# Patient Record
Sex: Female | Born: 1945 | Race: White | Hispanic: No | Smoking: Former smoker
Health system: Southern US, Community
[De-identification: ages and names within clinical notes are randomized; demographics above are authoritative.]

## PROBLEM LIST (undated history)

## (undated) DIAGNOSIS — I2699 Other pulmonary embolism without acute cor pulmonale: Secondary | ICD-10-CM

## (undated) DIAGNOSIS — M8580 Other specified disorders of bone density and structure, unspecified site: Secondary | ICD-10-CM

## (undated) DIAGNOSIS — M1990 Unspecified osteoarthritis, unspecified site: Secondary | ICD-10-CM

## (undated) DIAGNOSIS — J449 Chronic obstructive pulmonary disease, unspecified: Secondary | ICD-10-CM

## (undated) DIAGNOSIS — I4891 Unspecified atrial fibrillation: Secondary | ICD-10-CM

## (undated) DIAGNOSIS — M810 Age-related osteoporosis without current pathological fracture: Secondary | ICD-10-CM

## (undated) DIAGNOSIS — R627 Adult failure to thrive: Secondary | ICD-10-CM

## (undated) DIAGNOSIS — E785 Hyperlipidemia, unspecified: Secondary | ICD-10-CM

## (undated) DIAGNOSIS — H269 Unspecified cataract: Secondary | ICD-10-CM

## (undated) DIAGNOSIS — K219 Gastro-esophageal reflux disease without esophagitis: Secondary | ICD-10-CM

## (undated) DIAGNOSIS — I7300 Raynaud's syndrome without gangrene: Secondary | ICD-10-CM

## (undated) HISTORY — DX: Raynaud's syndrome without gangrene: I73.00

## (undated) HISTORY — DX: Chronic obstructive pulmonary disease, unspecified: J44.9

## (undated) HISTORY — DX: Gastro-esophageal reflux disease without esophagitis: K21.9

## (undated) HISTORY — DX: Unspecified cataract: H26.9

## (undated) HISTORY — DX: Age-related osteoporosis without current pathological fracture: M81.0

## (undated) HISTORY — DX: Unspecified osteoarthritis, unspecified site: M19.90

## (undated) HISTORY — DX: Other pulmonary embolism without acute cor pulmonale: I26.99

## (undated) HISTORY — PX: TUBAL LIGATION: SHX77

## (undated) HISTORY — DX: Other specified disorders of bone density and structure, unspecified site: M85.80

## (undated) HISTORY — DX: Hyperlipidemia, unspecified: E78.5

## (undated) HISTORY — PX: FOOT NEUROMA SURGERY: SHX646

---

## 2000-11-03 ENCOUNTER — Emergency Department (HOSPITAL_COMMUNITY): Admission: EM | Admit: 2000-11-03 | Discharge: 2000-11-03 | Payer: Self-pay | Admitting: Emergency Medicine

## 2000-11-03 ENCOUNTER — Encounter: Payer: Self-pay | Admitting: Emergency Medicine

## 2002-03-29 ENCOUNTER — Other Ambulatory Visit: Admission: RE | Admit: 2002-03-29 | Discharge: 2002-03-29 | Payer: Self-pay | Admitting: Obstetrics and Gynecology

## 2004-12-31 ENCOUNTER — Other Ambulatory Visit: Admission: RE | Admit: 2004-12-31 | Discharge: 2004-12-31 | Payer: Self-pay | Admitting: Family Medicine

## 2006-06-03 ENCOUNTER — Other Ambulatory Visit: Admission: RE | Admit: 2006-06-03 | Discharge: 2006-06-03 | Payer: Self-pay | Admitting: Family Medicine

## 2011-11-08 ENCOUNTER — Encounter: Payer: Self-pay | Admitting: Internal Medicine

## 2011-11-09 ENCOUNTER — Encounter: Payer: Self-pay | Admitting: Internal Medicine

## 2011-11-09 ENCOUNTER — Ambulatory Visit (INDEPENDENT_AMBULATORY_CARE_PROVIDER_SITE_OTHER): Payer: PRIVATE HEALTH INSURANCE | Admitting: Internal Medicine

## 2011-11-09 VITALS — BP 110/72 | HR 76 | Temp 97.6°F | Ht 66.0 in | Wt 127.0 lb

## 2011-11-09 DIAGNOSIS — J449 Chronic obstructive pulmonary disease, unspecified: Secondary | ICD-10-CM

## 2011-11-09 NOTE — Patient Instructions (Signed)
symbicort 160 Take 2 puffs first thing in am and then another 2 puffs about 12 hours later if any breathing problems at all  Continue spiriva daily for now (think of it like high octane fuel)   Work on inhaler technique:  relax and gently blow all the way out then take a nice smooth deep breath back in, triggering the inhaler at same time you start breathing in.  Hold for up to 5 seconds if you can.  Rinse and gargle with water when done   If your mouth or throat starts to bother you,   I suggest you time the inhaler to your dental care and after using the inhaler(s) brush teeth and tongue with a baking soda containing toothpaste and when you rinse this out, gargle with it first to see if this helps your mouth and throat.    GERD (REFLUX)  is an extremely common cause of respiratory symptoms, many times with no significant heartburn at all.    It can be treated with medication, but also with lifestyle changes including avoidance of late meals, excessive alcohol, smoking cessation, and avoid fatty foods, chocolate, peppermint, colas, red wine, and acidic juices such as orange juice.  NO MINT OR MENTHOL PRODUCTS SO NO COUGH DROPS  USE SUGARLESS CANDY INSTEAD (jolley ranchers or Stover's)  NO OIL BASED VITAMINS - use powdered substitutes.    Please schedule a follow up visit in 3 months but call sooner if needed with CXR on return

## 2011-11-09 NOTE — Progress Notes (Signed)
  Subjective:    Patient ID: Shannon Jackson, female    DOB: 1946-10-02, 65 y.o.   MRN: [id number]  HPI  57 yowf quit smoking 2008 p dx of copd 2006 with freq exacerbations sev times a year but in between baseline =  doe vacuuming x 10 min so referred 11/09/2011 to pulmonary clinic in [place] by Dr Christell Constant.  11/09/2011 1st pulmonary eval cc indolent onset persistent  doe x vacuuming x 14m  And still dances but only  x 5 min and does aerobics x 15 min s stopping.  Maintained on spiriva once daily in am and prn symbicort and occ proventil.  Minimal cough more day than night.  Exac typically assoc with uri/ sinus flares which last a week or two and require ov's/ abx / minimal prednisone.  Sleeping ok without nocturnal  or early am exacerbation  of respiratory  c/o's or need for noct saba. Also denies any obvious fluctuation of symptoms with weather or environmental changes or other aggravating or alleviating factors except as outlined above.      Review of Systems  Constitutional: Negative for fever and unexpected weight change.  HENT: Negative for ear pain, nosebleeds, congestion, sore throat, rhinorrhea, sneezing, trouble swallowing, dental problem, postnasal drip and sinus pressure.   Eyes: Negative for redness and itching.  Respiratory: Positive for shortness of breath. Negative for cough, chest tightness and wheezing.   Cardiovascular: Negative for palpitations and leg swelling.  Gastrointestinal: Negative for nausea and vomiting.  Genitourinary: Negative for dysuria.  Musculoskeletal: Negative for joint swelling.  Skin: Negative for rash.  Neurological: Negative for headaches.  Hematological: Does not bruise/bleed easily.  Psychiatric/Behavioral: Positive for dysphoric mood. The patient is nervous/anxious.        Objective:   Physical Exam  11/09/2011  127  HEENT mild turbinate edema.  Oropharynx no thrush or excess pnd or cobblestoning.  No JVD or cervical adenopathy. Mild accessory  muscle hypertrophy. Trachea midline, nl thryroid. Chest was hyperinflated by percussion with diminished breath sounds and moderate increased exp time without wheeze. Hoover sign positive at mid inspiration. Regular rate and rhythm without murmur gallop or rub or increase P2 or edema.  Abd: no hsm, nl excursion. Ext warm without cyanosis or clubbing.    cxr 07/23/11 COPD with no acute dz    Assessment & Plan:

## 2011-11-10 DIAGNOSIS — J449 Chronic obstructive pulmonary disease, unspecified: Secondary | ICD-10-CM | POA: Insufficient documentation

## 2011-11-10 NOTE — Assessment & Plan Note (Addendum)
DDX of  difficult airways managment all start with A and  include Adherence, Ace Inhibitors, Acid Reflux, Active Sinus Disease, Alpha 1 Antitripsin deficiency, Anxiety masquerading as Airways dz,  ABPA,  allergy(esp in young), Aspiration (esp in elderly), Adverse effects of DPI,  Active smokers, plus two Bs  = Bronchiectasis and Beta blocker use..and one C= CHF  Adherence is always the initial "prime suspect" and is a multilayered concern that requires a "trust but verify" approach in every patient - starting with knowing how to use medications, especially inhalers, correctly, keeping up with refills and understanding the fundamental difference between maintenance and prns vs those medications only taken for a very short course and then stopped and not refilled. The proper method of use, as well as anticipated side effects, of this metered-dose inhaler are discussed and demonstrated to the patient. Improved to 75% p extensive teaching  She has 29% p bronchodilators and yet is casual with use of symbicort which I strongly reinforced.   ? Acid reflux > diet reviewed

## 2012-01-27 ENCOUNTER — Telehealth: Payer: Self-pay | Admitting: Internal Medicine

## 2012-01-27 NOTE — Telephone Encounter (Signed)
LMTCB x 1 

## 2012-01-28 NOTE — Telephone Encounter (Signed)
No but either need to bring the actual cxr in hand or the report to complete the w/u

## 2012-01-28 NOTE — Telephone Encounter (Signed)
Pt was seen by Dr. Sherene Sires on 11/09/11 and was instructed to: Please schedule a follow up visit in 3 months but call sooner if needed with CXR on return  Pt states she had a COPD Exacerbation and was seen by PCP for this on Monday and a CXR was done at [facility].  She would like to know if Dr. Sherene Sires would still want her to have a CXR prior to her next appt with him which is scheduled on 03/07/12 in [place].  Dr. Sherene Sires, pls advise.  Thanks!

## 2012-01-28 NOTE — Telephone Encounter (Signed)
Called, spoke with pt.  She is aware she does not need another CXR prior to next OV with MW but she will need to either bring the actual cxr with her or a copy of the report to complete the w/u.  She verbalized understanding of this and nothing further needed at this time.

## 2012-03-07 ENCOUNTER — Ambulatory Visit: Payer: Self-pay | Admitting: Internal Medicine

## 2012-04-20 ENCOUNTER — Other Ambulatory Visit: Payer: Self-pay | Admitting: Orthopedic Surgery

## 2012-04-20 DIAGNOSIS — M25559 Pain in unspecified hip: Secondary | ICD-10-CM

## 2012-05-02 ENCOUNTER — Other Ambulatory Visit: Payer: Self-pay

## 2012-05-22 ENCOUNTER — Other Ambulatory Visit: Payer: Self-pay

## 2012-05-27 ENCOUNTER — Ambulatory Visit
Admission: RE | Admit: 2012-05-27 | Discharge: 2012-05-27 | Disposition: A | Discharge status: Home / Self Care | Payer: Medicare Other | Source: Ambulatory Visit | Attending: Orthopedic Surgery | Admitting: Orthopedic Surgery

## 2012-05-27 DIAGNOSIS — M25559 Pain in unspecified hip: Secondary | ICD-10-CM

## 2013-03-05 ENCOUNTER — Other Ambulatory Visit: Payer: Self-pay | Admitting: *Deleted

## 2013-03-05 MED ORDER — LORAZEPAM 1 MG PO TABS: 1.0000 mg | ORAL_TABLET | Freq: Two times a day (BID) | ORAL | Status: DC | PRN

## 2013-03-05 NOTE — Telephone Encounter (Signed)
Pt aware rx for lorazepam ready for pick up here.

## 2013-05-30 ENCOUNTER — Other Ambulatory Visit: Payer: Self-pay | Admitting: *Deleted

## 2013-05-30 NOTE — Telephone Encounter (Signed)
LAST RF 04/18/13. LAST OV 1/14. PLEASE CALL IN [place] [phone]

## 2013-06-01 ENCOUNTER — Other Ambulatory Visit: Payer: Self-pay | Admitting: *Deleted

## 2013-06-01 MED ORDER — LORAZEPAM 1 MG PO TABS: 1.0000 mg | ORAL_TABLET | Freq: Two times a day (BID) | ORAL | Status: DC | PRN

## 2013-06-01 NOTE — Telephone Encounter (Signed)
rx called into pharmacy

## 2013-06-01 NOTE — Telephone Encounter (Signed)
Please call in rx for ativan with 1 refill 

## 2013-06-01 NOTE — Telephone Encounter (Signed)
Patient last seen in office on 12-29-12 by ACM. Was instructed to follow up in 6 months per note. Has appt with [facility] in July. Rx last filled on 04-18-13 for #60. Please advise. If approved please have nurse phone in to [facility] in [place] at [phone]

## 2013-06-28 ENCOUNTER — Ambulatory Visit (INDEPENDENT_AMBULATORY_CARE_PROVIDER_SITE_OTHER): Payer: Medicare Other | Admitting: Family Medicine

## 2013-06-28 ENCOUNTER — Encounter: Payer: Self-pay | Admitting: Family Medicine

## 2013-06-28 VITALS — BP 119/73 | HR 90 | Temp 97.6°F | Ht 66.0 in | Wt 124.6 lb

## 2013-06-28 DIAGNOSIS — J449 Chronic obstructive pulmonary disease, unspecified: Secondary | ICD-10-CM

## 2013-06-28 DIAGNOSIS — E7800 Pure hypercholesterolemia, unspecified: Secondary | ICD-10-CM

## 2013-06-28 DIAGNOSIS — I7300 Raynaud's syndrome without gangrene: Secondary | ICD-10-CM | POA: Insufficient documentation

## 2013-06-28 DIAGNOSIS — K219 Gastro-esophageal reflux disease without esophagitis: Secondary | ICD-10-CM

## 2013-06-28 LAB — POCT CBC
Granulocyte percent: 63.1 %G (ref 37–80)
HCT, POC: 45.2 % (ref 37.7–47.9)
Hemoglobin: 14.7 g/dL (ref 12.2–16.2)
Lymph, poc: 2.1 (ref 0.6–3.4)
MCH, POC: 30.4 pg (ref 27–31.2)
MCHC: 32.6 g/dL (ref 31.8–35.4)
MCV: 93.1 fL (ref 80–97)
MPV: 7.5 fL (ref 0–99.8)
POC Granulocyte: 4.4 (ref 2–6.9)
POC LYMPH PERCENT: 31 %L (ref 10–50)
Platelet Count, POC: 226 10*3/uL (ref 142–424)
RBC: 4.9 M/uL (ref 4.04–5.48)
RDW, POC: 13 %
WBC: 6.9 10*3/uL (ref 4.6–10.2)

## 2013-06-28 MED ORDER — PANTOPRAZOLE SODIUM 40 MG PO TBEC: 40.0000 mg | DELAYED_RELEASE_TABLET | Freq: Every day | ORAL | Status: DC

## 2013-06-28 MED ORDER — TIOTROPIUM BROMIDE MONOHYDRATE 18 MCG IN CAPS: 18.0000 ug | ORAL_CAPSULE | Freq: Every day | RESPIRATORY_TRACT | Status: DC

## 2013-06-28 MED ORDER — ROFLUMILAST 500 MCG PO TABS: 500.0000 ug | ORAL_TABLET | Freq: Every day | ORAL | Status: DC

## 2013-06-28 NOTE — Patient Instructions (Addendum)
      Dr Rakiyah Esch's Recommendations  Diet and Exercise discussed with patient.  For nutrition information, I recommend books:  1).Eat to Live by Dr Joel Fuhrman. 2).Prevent and Reverse Heart Disease by Dr Caldwell Esselstyn. 3) Dr Neal Barnard's Book:  Program to Reverse Diabetes  Exercise recommendations are:  If unable to walk, then the patient can exercise in a chair 3 times a day. By flapping arms like a bird gently and raising legs outwards to the front.  If ambulatory, the patient can go for walks for 30 minutes 3 times a week. Then increase the intensity and duration as tolerated.  Goal is to try to attain exercise frequency to 5 times a week.  If applicable: Best to perform resistance exercises (machines or weights) 2 days a week and cardio type exercises 3 days per week.  

## 2013-06-28 NOTE — Progress Notes (Signed)
Patient ID: Shannon Jackson, female   DOB: 28-Aug-1946, 67 y.o.   MRN: [id number] SUBJECTIVE: CC: Chief Complaint  Patient presents with  . Follow-up    6 month follow up refills needed . congestion hx copd    HPI: Here for follow up of COPD. Intermittent exacerbation of COPD due to mowing grass and certain days with high pollen counts.today a little congested.No chest pain.  Past Medical History  Diagnosis Date  . Raynaud's syndrome   . Arthritis     left hip  . Allergic rhinitis   . Osteopenia   . COPD (chronic obstructive pulmonary disease)    Past Surgical History  Procedure Laterality Date  . Foot neuroma surgery    . Tubal ligation     History   Social History  . Marital Status: Single    Spouse Name: N/A    Number of Children: N/A  . Years of Education: N/A   Occupational History  . retired    Social History Main Topics  . Smoking status: Former Smoker -- 1.00 packs/day for 25 years    Types: Cigarettes    Quit date: 12/06/2006  . Smokeless tobacco: Never Used  . Alcohol Use: No  . Drug Use: No  . Sexually Active: Not on file   Other Topics Concern  . Not on file   Social History Narrative  . No narrative on file   Family History  Problem Relation Age of Onset  . Melanoma Mother   . Emphysema Father   . Rheum arthritis Father    Current Outpatient Prescriptions on File Prior to Visit  Medication Sig Dispense Refill  . albuterol (PROVENTIL HFA;VENTOLIN HFA) 108 (90 BASE) MCG/ACT inhaler Inhale 2 puffs into the lungs every 6 (six) hours as needed.        . budesonide-formoterol (SYMBICORT) 160-4.5 MCG/ACT inhaler Inhale 2 puffs into the lungs 2 (two) times daily.        Marland Kitchen LORazepam (ATIVAN) 1 MG tablet Take 1 tablet (1 mg total) by mouth 2 (two) times daily as needed.  60 tablet  1  . Multiple Vitamin (MULTIVITAMIN) tablet Take 1 tablet by mouth daily.        . calcium-vitamin D (OSCAL WITH D) 250-125 MG-UNIT per tablet Take 1 tablet by mouth daily.          No current facility-administered medications on file prior to visit.   Allergies  Allergen Reactions  . Codeine   . Levofloxacin    Immunization History  Administered Date(s) Administered  . DTaP 05/06/2006  . Influenza Split 10/06/2001, 11/05/2006, 09/05/2010  . Influenza Whole 09/06/2011  . Pneumococcal-Generic 09/05/2010   Prior to Admission medications   Medication Sig Start Date End Date Taking? Authorizing Provider  albuterol (ACCUNEB) 1.25 MG/3ML nebulizer solution Take 1 ampule by nebulization 3 (three) times daily as needed for wheezing.   Yes Historical Provider, MD  albuterol (PROVENTIL HFA;VENTOLIN HFA) 108 (90 BASE) MCG/ACT inhaler Inhale 2 puffs into the lungs every 6 (six) hours as needed.     Yes Historical Provider, MD  budesonide-formoterol (SYMBICORT) 160-4.5 MCG/ACT inhaler Inhale 2 puffs into the lungs 2 (two) times daily.     Yes Historical Provider, MD  LORazepam (ATIVAN) 1 MG tablet Take 1 tablet (1 mg total) by mouth 2 (two) times daily as needed. 06/01/13  Yes Westlynn-Margaret Daphine Deutscher, FNP  Multiple Vitamin (MULTIVITAMIN) tablet Take 1 tablet by mouth daily.     Yes Historical Provider, MD  pantoprazole (PROTONIX)  40 MG tablet Take 1 tablet (40 mg total) by mouth daily. 06/28/13  Yes Ileana Ladd, MD  predniSONE (DELTASONE) 10 MG tablet  05/05/13  Yes Historical Provider, MD  Pseudoephedrine-Guaifenesin (MUCINEX D) [phone] MG TB12 Take 1 tablet by mouth.   Yes Historical Provider, MD  tiotropium (SPIRIVA) 18 MCG inhalation capsule Place 1 capsule (18 mcg total) into inhaler and inhale daily. 06/28/13  Yes Ileana Ladd, MD  calcium-vitamin D (OSCAL WITH D) 250-125 MG-UNIT per tablet Take 1 tablet by mouth daily.      Historical Provider, MD  roflumilast (DALIRESP) 500 MCG TABS tablet Take 1 tablet (500 mcg total) by mouth daily. 06/28/13   Ileana Ladd, MD     ROS: As above in the HPI. All other systems are stable or negative.  OBJECTIVE: APPEARANCE:   Patient in no acute distress.The patient appeared well nourished and normally developed. Acyanotic. Waist: VITAL SIGNS:BP 119/73  Pulse 90  Temp(Src) 97.6 F (36.4 C) (Oral)  Ht 5\' 6"  (1.676 m)  Wt 124 lb 9.6 oz (56.518 kg)  BMI 20.12 kg/m2 WF  SKIN: warm and  Dry without overt rashes, tattoos and scars  HEAD and Neck: without JVD, Head and scalp: normal Eyes:No scleral icterus. Fundi normal, eye movements normal. Ears: Auricle normal, canal normal, Tympanic membranes normal, insufflation normal. Nose: mild Throat: normal Neck & thyroid: normal  CHEST & LUNGS: Chest wall: normal Lungs: Coarse breath sounds with an occasional expiratory wheeze.  CVS: Reveals the PMI to be normally located. Regular rhythm, First and Second Heart sounds are normal,  absence of murmurs, rubs or gallops. Peripheral vasculature: Radial pulses: normal Dorsal pedis pulses: normal Posterior pulses: normal  ABDOMEN:  Appearance: normal Benign, no organomegaly, no masses, no Abdominal Aortic enlargement. No Guarding , no rebound. No Bruits. Bowel sounds: normal  RECTAL: N/A GU: N/A  EXTREMETIES: nonedematous. Both Femoral and Pedal pulses are normal.  MUSCULOSKELETAL:  Spine: normal Joints: intact  NEUROLOGIC: oriented to time,place and person; nonfocal. Strength is normal Sensory is normal Reflexes are normal Cranial Nerves are normal.    ASSESSMENT: Raynaud's disease - Plan: COMPLETE METABOLIC PANEL WITH GFR, Vitamin D 25 hydroxy  COPD (chronic obstructive pulmonary disease) - Plan: tiotropium (SPIRIVA) 18 MCG inhalation capsule, POCT CBC, CANCELED: Alpha-1 antitrypsin phenotype  GERD (gastroesophageal reflux disease) - Plan: pantoprazole (PROTONIX) 40 MG tablet  Pure hypercholesterolemia - Plan: COMPLETE METABOLIC PANEL WITH GFR, NMR Lipoprofile with Lipids, Vitamin D 25 hydroxy  PLAN: Orders Placed This Encounter  Procedures  . COMPLETE METABOLIC PANEL WITH GFR  . NMR  Lipoprofile with Lipids  . Vitamin D 25 hydroxy  . POCT CBC    Added the alpha-1 antitrypsin gene screen to labs. Separate test.  Meds ordered this encounter  Medications  . DISCONTD: pantoprazole (PROTONIX) 40 MG tablet    Sig:   . predniSONE (DELTASONE) 10 MG tablet    Sig:   . albuterol (ACCUNEB) 1.25 MG/3ML nebulizer solution    Sig: Take 1 ampule by nebulization 3 (three) times daily as needed for wheezing.  . Pseudoephedrine-Guaifenesin (MUCINEX D) [phone] MG TB12    Sig: Take 1 tablet by mouth.  . tiotropium (SPIRIVA) 18 MCG inhalation capsule    Sig: Place 1 capsule (18 mcg total) into inhaler and inhale daily.    Dispense:  90 capsule    Refill:  3  . pantoprazole (PROTONIX) 40 MG tablet    Sig: Take 1 tablet (40 mg total) by mouth daily.  Dispense:  30 tablet    Refill:  11  . roflumilast (DALIRESP) 500 MCG TABS tablet    Sig: Take 1 tablet (500 mcg total) by mouth daily.    Dispense:  28 tablet    Refill:  0        Dr Woodroe Mode Recommendations  Diet and Exercise discussed with patient.  For nutrition information, I recommend books:  1).Eat to Live by Dr Monico Hoar. 2).Prevent and Reverse Heart Disease by Dr Suzzette Righter. 3) Dr Katherina Right Book:  Program to Reverse Diabetes  Exercise recommendations are:  If unable to walk, then the patient can exercise in a chair 3 times a day. By flapping arms like a bird gently and raising legs outwards to the front.  If ambulatory, the patient can go for walks for 30 minutes 3 times a week. Then increase the intensity and duration as tolerated.  Goal is to try to attain exercise frequency to 5 times a week.  If applicable: Best to perform resistance exercises (machines or weights) 2 days a week and cardio type exercises 3 days per week.  Return in about 3 months (around 09/28/2013) for Recheck medical problems.  Yesha Muchow P. Modesto Charon, M.D.

## 2013-06-29 LAB — COMPLETE METABOLIC PANEL WITH GFR
ALT: 16 U/L (ref 0–35)
AST: 21 U/L (ref 0–37)
Albumin: 4.2 g/dL (ref 3.5–5.2)
Alkaline Phosphatase: 60 U/L (ref 39–117)
BUN: 10 mg/dL (ref 6–23)
CO2: 29 mEq/L (ref 19–32)
Calcium: 9.3 mg/dL (ref 8.4–10.5)
Chloride: 102 mEq/L (ref 96–112)
Creat: 0.76 mg/dL (ref 0.50–1.10)
GFR, Est African American: >89 mL/min
GFR, Est Non African American: 81 mL/min
Glucose, Bld: 81 mg/dL (ref 70–99)
Potassium: 4.3 mEq/L (ref 3.5–5.3)
Sodium: 140 mEq/L (ref 135–145)
Total Bilirubin: 0.6 mg/dL (ref 0.3–1.2)
Total Protein: 6.5 g/dL (ref 6.0–8.3)

## 2013-06-29 LAB — VITAMIN D 25 HYDROXY (VIT D DEFICIENCY, FRACTURES): Vit D, 25-Hydroxy: 31 ng/mL (ref 30–89)

## 2013-07-02 LAB — NMR LIPOPROFILE WITH LIPIDS
Cholesterol, Total: 189 mg/dL (ref <200)
HDL Particle Number: 37.9 umol/L (ref >=30.5)
HDL Size: 9.3 nm (ref >=9.2)
HDL-C: 64 mg/dL (ref >=40)
LDL (calc): 101 mg/dL — ABNORMAL HIGH (ref <100)
LDL Particle Number: 1246 nmol/L — ABNORMAL HIGH (ref <1000)
LDL Size: 20.8 nm (ref >20.5)
LP-IR Score: <25 (ref <=45)
Large HDL-P: 9.7 umol/L (ref >=4.8)
Large VLDL-P: 1.3 nmol/L (ref <=2.7)
Small LDL Particle Number: 451 nmol/L (ref <=527)
Triglycerides: 121 mg/dL (ref <150)
VLDL Size: 41.1 nm (ref <=46.6)

## 2013-07-03 NOTE — Progress Notes (Signed)
Quick Note:  Lab result at goal. No change in Medications for now. No Change in plans and follow up. ______ 

## 2013-07-09 ENCOUNTER — Encounter: Payer: Self-pay | Admitting: *Deleted

## 2013-07-23 ENCOUNTER — Other Ambulatory Visit: Payer: Self-pay

## 2013-07-23 MED ORDER — BUDESONIDE-FORMOTEROL FUMARATE 160-4.5 MCG/ACT IN AERO: 2.0000 | INHALATION_SPRAY | Freq: Two times a day (BID) | RESPIRATORY_TRACT | Status: DC

## 2013-08-20 ENCOUNTER — Encounter: Payer: Self-pay | Admitting: *Deleted

## 2013-08-21 ENCOUNTER — Other Ambulatory Visit: Payer: Self-pay | Admitting: Nurse Practitioner

## 2013-08-22 NOTE — Telephone Encounter (Signed)
LAST RF 07/05/13. LAST OV 06/28/13. CALL IN [facility] [place] [phone]

## 2013-08-23 NOTE — Telephone Encounter (Signed)
Patient needs to be seen. Has exceeded time since last visit. Needs to bring all medications to next appointment. Rx was written by me. Which provider wrote it will need to refill it.

## 2013-08-24 ENCOUNTER — Other Ambulatory Visit: Payer: Self-pay | Admitting: Nurse Practitioner

## 2013-08-24 MED ORDER — LORAZEPAM 1 MG PO TABS: 1.0000 mg | ORAL_TABLET | Freq: Two times a day (BID) | ORAL | Status: DC | PRN

## 2013-08-24 NOTE — Telephone Encounter (Signed)
Refill

## 2013-09-11 ENCOUNTER — Ambulatory Visit: Payer: Medicare Other | Admitting: Family Medicine

## 2013-09-28 ENCOUNTER — Ambulatory Visit: Payer: Medicare Other | Admitting: Family Medicine

## 2013-10-03 ENCOUNTER — Ambulatory Visit: Payer: Medicare Other | Admitting: Family Medicine

## 2013-10-04 ENCOUNTER — Ambulatory Visit: Payer: Medicare Other | Admitting: Family Medicine

## 2013-11-09 ENCOUNTER — Ambulatory Visit (INDEPENDENT_AMBULATORY_CARE_PROVIDER_SITE_OTHER): Payer: Medicare Other | Admitting: Family Medicine

## 2013-11-09 ENCOUNTER — Encounter: Payer: Self-pay | Admitting: Family Medicine

## 2013-11-09 VITALS — BP 116/68 | HR 92 | Temp 97.6°F | Ht 66.0 in | Wt 126.0 lb

## 2013-11-09 DIAGNOSIS — J449 Chronic obstructive pulmonary disease, unspecified: Secondary | ICD-10-CM

## 2013-11-09 DIAGNOSIS — M899 Disorder of bone, unspecified: Secondary | ICD-10-CM

## 2013-11-09 DIAGNOSIS — M1990 Unspecified osteoarthritis, unspecified site: Secondary | ICD-10-CM | POA: Insufficient documentation

## 2013-11-09 DIAGNOSIS — M8580 Other specified disorders of bone density and structure, unspecified site: Secondary | ICD-10-CM | POA: Insufficient documentation

## 2013-11-09 DIAGNOSIS — J309 Allergic rhinitis, unspecified: Secondary | ICD-10-CM | POA: Insufficient documentation

## 2013-11-09 DIAGNOSIS — J4489 Other specified chronic obstructive pulmonary disease: Secondary | ICD-10-CM

## 2013-11-09 DIAGNOSIS — E785 Hyperlipidemia, unspecified: Secondary | ICD-10-CM

## 2013-11-09 DIAGNOSIS — I7300 Raynaud's syndrome without gangrene: Secondary | ICD-10-CM

## 2013-11-09 LAB — POCT CBC
Granulocyte percent: 89.2 %G — AB (ref 37–80)
HCT, POC: 50.1 % — AB (ref 37.7–47.9)
Hemoglobin: 15.6 g/dL (ref 12.2–16.2)
Lymph, poc: 1 (ref 0.6–3.4)
MCH, POC: 29.6 pg (ref 27–31.2)
MCHC: 31.1 g/dL — AB (ref 31.8–35.4)
MCV: 95.3 fL (ref 80–97)
MPV: 8 fL (ref 0–99.8)
POC Granulocyte: 9 — AB (ref 2–6.9)
POC LYMPH PERCENT: 10.2 %L (ref 10–50)
Platelet Count, POC: 227 10*3/uL (ref 142–424)
RBC: 5.3 M/uL (ref 4.04–5.48)
RDW, POC: 13.1 %
WBC: 10.1 10*3/uL (ref 4.6–10.2)

## 2013-11-09 MED ORDER — BUDESONIDE-FORMOTEROL FUMARATE 160-4.5 MCG/ACT IN AERO: 2.0000 | INHALATION_SPRAY | Freq: Two times a day (BID) | RESPIRATORY_TRACT | Status: DC

## 2013-11-09 NOTE — Progress Notes (Signed)
Patient ID: Shannon Jackson, female   DOB: 1946-10-06, 67 y.o.   MRN: [id number] SUBJECTIVE: CC: Chief Complaint  Patient presents with  . Follow-up    FOLLOW UP  refill lorazepam and ativan     HPI:  Patient is here for follow up of hyperlipidemia/copd/raynauds/osteopenia: denies Headache;denies Chest Pain;denies weakness;denies Shortness of Breath and orthopnea;denies Visual changes;denies palpitations;denies cough;denies pedal edema;denies symptoms of TIA or stroke;deniesClaudication symptoms. admits to Compliance with medications; denies Problems with medications.  Sees Pulmonary Dr Orson Aloe from [facility], whi has  [facility] in [place]. He Rx for her the clonazepam.  No longer smokes.  Afraid to pursue her mammogram, there is request for additional studies. She does not want any thing else done if she has breast cancer.   Past Medical History  Diagnosis Date  . Raynaud's syndrome   . Osteopenia   . Allergic rhinitis   . Arthritis     left hip  . COPD (chronic obstructive pulmonary disease)   . Hyperlipidemia    Past Surgical History  Procedure Laterality Date  . Foot neuroma surgery    . Tubal ligation     History   Social History  . Marital Status: Single    Spouse Name: N/A    Number of Children: N/A  . Years of Education: N/A   Occupational History  . retired    Social History Main Topics  . Smoking status: Former Smoker -- 1.00 packs/day for 25 years    Types: Cigarettes    Quit date: 12/06/2006  . Smokeless tobacco: Never Used  . Alcohol Use: No  . Drug Use: No  . Sexual Activity: Not on file   Other Topics Concern  . Not on file   Social History Narrative  . No narrative on file   Family History  Problem Relation Age of Onset  . Melanoma Mother   . Emphysema Father   . Rheum arthritis Father    Current Outpatient Prescriptions on File Prior to Visit  Medication Sig Dispense Refill  . albuterol (ACCUNEB) 1.25 MG/3ML  nebulizer solution Take 1 ampule by nebulization 3 (three) times daily as needed for wheezing.      Marland Kitchen albuterol (PROVENTIL HFA;VENTOLIN HFA) 108 (90 BASE) MCG/ACT inhaler Inhale 2 puffs into the lungs every 6 (six) hours as needed.        . calcium-vitamin D (OSCAL WITH D) 250-125 MG-UNIT per tablet Take 1 tablet by mouth daily.        Marland Kitchen LORazepam (ATIVAN) 1 MG tablet Take 1 tablet (1 mg total) by mouth 2 (two) times daily as needed.  60 tablet  1  . Multiple Vitamin (MULTIVITAMIN) tablet Take 1 tablet by mouth daily.        . pantoprazole (PROTONIX) 40 MG tablet Take 1 tablet (40 mg total) by mouth daily.  30 tablet  11  . predniSONE (DELTASONE) 10 MG tablet       . Pseudoephedrine-Guaifenesin (MUCINEX D) [phone] MG TB12 Take 1 tablet by mouth.      . tiotropium (SPIRIVA) 18 MCG inhalation capsule Place 1 capsule (18 mcg total) into inhaler and inhale daily.  90 capsule  3  . roflumilast (DALIRESP) 500 MCG TABS tablet Take 1 tablet (500 mcg total) by mouth daily.  28 tablet  0   No current facility-administered medications on file prior to visit.   Allergies  Allergen Reactions  . Codeine   . Levofloxacin    Immunization History  Administered Date(s) Administered  .  DTaP 05/06/2006  . Influenza Split 10/06/2001, 11/05/2006, 09/05/2010  . Influenza Whole 09/06/2011  . Influenza-Unspecified 08/14/2013  . Pneumococcal-Unspecified 09/05/2010   Prior to Admission medications   Medication Sig Start Date End Date Taking? Authorizing Provider  albuterol (ACCUNEB) 1.25 MG/3ML nebulizer solution Take 1 ampule by nebulization 3 (three) times daily as needed for wheezing.    Historical Provider, MD  albuterol (PROVENTIL HFA;VENTOLIN HFA) 108 (90 BASE) MCG/ACT inhaler Inhale 2 puffs into the lungs every 6 (six) hours as needed.      Historical Provider, MD  budesonide-formoterol (SYMBICORT) 160-4.5 MCG/ACT inhaler Inhale 2 puffs into the lungs 2 (two) times daily. 07/23/13   Ernestina Penna, MD   calcium-vitamin D (OSCAL WITH D) 250-125 MG-UNIT per tablet Take 1 tablet by mouth daily.      Historical Provider, MD  LORazepam (ATIVAN) 1 MG tablet Take 1 tablet (1 mg total) by mouth 2 (two) times daily as needed. 08/24/13   Linette-Margaret Daphine Deutscher, FNP  Multiple Vitamin (MULTIVITAMIN) tablet Take 1 tablet by mouth daily.      Historical Provider, MD  pantoprazole (PROTONIX) 40 MG tablet Take 1 tablet (40 mg total) by mouth daily. 06/28/13   Ileana Ladd, MD  predniSONE (DELTASONE) 10 MG tablet  05/05/13   Historical Provider, MD  Pseudoephedrine-Guaifenesin (MUCINEX D) [phone] MG TB12 Take 1 tablet by mouth.    Historical Provider, MD  roflumilast (DALIRESP) 500 MCG TABS tablet Take 1 tablet (500 mcg total) by mouth daily. 06/28/13   Ileana Ladd, MD  tiotropium (SPIRIVA) 18 MCG inhalation capsule Place 1 capsule (18 mcg total) into inhaler and inhale daily. 06/28/13   Ileana Ladd, MD     ROS: As above in the HPI. All other systems are stable or negative.  OBJECTIVE: APPEARANCE:  Patient in no acute distress.The patient appeared well nourished and normally developed. Acyanotic. Waist: VITAL SIGNS:BP 116/68  Pulse 92  Temp(Src) 97.6 F (36.4 C) (Oral)  Ht 5\' 6"  (1.676 m)  Wt 126 lb (57.153 kg)  BMI 20.35 kg/m2  WF SKIN: warm and  Dry without overt rashes, tattoos and scars  HEAD and Neck: without JVD, Head and scalp: normal Eyes:No scleral icterus. Fundi normal, eye movements normal. Ears: Auricle normal, canal normal, Tympanic membranes normal, insufflation normal. Nose: normal Throat: normal Neck & thyroid: normal  CHEST & LUNGS: Chest wall: normal Lungs: Clear  CVS: Reveals the PMI to be normally located. Regular rhythm, First and Second Heart sounds are normal,  absence of murmurs, rubs or gallops. Peripheral vasculature: Radial pulses: normal Dorsal pedis pulses: normal Posterior pulses: normal  ABDOMEN:  Appearance: normal Benign, no organomegaly, no  masses, no Abdominal Aortic enlargement. No Guarding , no rebound. No Bruits. Bowel sounds: normal  RECTAL: N/A GU: N/A  EXTREMETIES: nonedematous.  MUSCULOSKELETAL:  Spine: normal Joints: intact  NEUROLOGIC: oriented to time,place and person; nonfocal. Strength is normal Sensory is normal Reflexes are normal Cranial Nerves are normal.  ASSESSMENT: Raynaud's disease  Osteopenia  COPD (chronic obstructive pulmonary disease) - Plan: POCT CBC, budesonide-formoterol (SYMBICORT) 160-4.5 MCG/ACT inhaler  Hyperlipidemia - Plan: CMP14+EGFR, NMR, lipoprofile  PLAN:  Orders Placed This Encounter  Procedures  . CMP14+EGFR  . NMR, lipoprofile  . POCT CBC   Meds ordered this encounter  Medications  . clonazePAM (KLONOPIN) 0.5 MG tablet    Sig:   . azithromycin (ZITHROMAX) 250 MG tablet    Sig:   . budesonide-formoterol (SYMBICORT) 160-4.5 MCG/ACT inhaler    Sig:  Inhale 2 puffs into the lungs 2 (two) times daily.    Dispense:  1 Inhaler    Refill:  4    Discussed that she gets the clonazepam from Dr Orson Aloe and therefore I will discontinue Rx lorazepam for her.she agrees with this.  Medications Discontinued During This Encounter  Medication Reason  . budesonide-formoterol (SYMBICORT) 160-4.5 MCG/ACT inhaler Reorder    Follow up with Dr Orson Aloe. Discussed with her the need for advanced directive. Since she does not want any invasive interventions for cancers if she has it. She is unwilling to do the additional studies that is required from her screening mammogram.   Return in about 6 months (around 05/10/2014) for Recheck medical problems.  Gloriana Piltz P. Modesto Charon, M.D.

## 2013-11-09 NOTE — Patient Instructions (Addendum)
Gastroesophageal Reflux Disease, Adult Gastroesophageal reflux disease (GERD) happens when acid from your stomach flows up into the esophagus. When acid comes in contact with the esophagus, the acid causes soreness (inflammation) in the esophagus. Over time, GERD may create small holes (ulcers) in the lining of the esophagus. CAUSES   Increased body weight. This puts pressure on the stomach, making acid rise from the stomach into the esophagus.  Smoking. This increases acid production in the stomach.  Drinking alcohol. This causes decreased pressure in the lower esophageal sphincter (valve or ring of muscle between the esophagus and stomach), allowing acid from the stomach into the esophagus.  Late evening meals and a full stomach. This increases pressure and acid production in the stomach.  A malformed lower esophageal sphincter. Sometimes, no cause is found. SYMPTOMS   Burning pain in the lower part of the mid-chest behind the breastbone and in the mid-stomach area. This may occur twice a week or more often.  Trouble swallowing.  Sore throat.  Dry cough.  Asthma-like symptoms including chest tightness, shortness of breath, or wheezing. DIAGNOSIS  Your caregiver may be able to diagnose GERD based on your symptoms. In some cases, X-rays and other tests may be done to check for complications or to check the condition of your stomach and esophagus. TREATMENT  Your caregiver may recommend over-the-counter or prescription medicines to help decrease acid production. Ask your caregiver before starting or adding any new medicines.  HOME CARE INSTRUCTIONS   Change the factors that you can control. Ask your caregiver for guidance concerning weight loss, quitting smoking, and alcohol consumption.  Avoid foods and drinks that make your symptoms worse, such as:  Caffeine or alcoholic drinks.  Chocolate.  Peppermint or mint flavorings.  Garlic and onions.  Spicy foods.  Citrus fruits,  such as oranges, lemons, or limes.  Tomato-based foods such as sauce, chili, salsa, and pizza.  Fried and fatty foods.  Avoid lying down for the 3 hours prior to your bedtime or prior to taking a nap.  Eat small, frequent meals instead of large meals.  Wear loose-fitting clothing. Do not wear anything tight around your waist that causes pressure on your stomach.  Raise the head of your bed 6 to 8 inches with wood blocks to help you sleep. Extra pillows will not help.  Only take over-the-counter or prescription medicines for pain, discomfort, or fever as directed by your caregiver.  Do not take aspirin, ibuprofen, or other nonsteroidal anti-inflammatory drugs (NSAIDs). SEEK IMMEDIATE MEDICAL CARE IF:   You have pain in your arms, neck, jaw, teeth, or back.  Your pain increases or changes in intensity or duration.  You develop nausea, vomiting, or sweating (diaphoresis).  You develop shortness of breath, or you faint.  Your vomit is green, yellow, black, or looks like coffee grounds or blood.  Your stool is red, bloody, or black. These symptoms could be signs of other problems, such as heart disease, gastric bleeding, or esophageal bleeding. MAKE SURE YOU:   Understand these instructions.  Will watch your condition.  Will get help right away if you are not doing well or get worse. Document Released: 09/01/2005 Document Revised: 02/14/2012 Document Reviewed: 06/11/2011 [employer] Patient Information 2014 [employer].        Dr Shatisha Falter's Recommendations  For nutrition information, I recommend books:  1).Eat to Live by Dr Joel Fuhrman. 2).Prevent and Reverse Heart Disease by Dr Caldwell Esselstyn. 3) Dr Neal Barnard's Book:  Program to Reverse Diabetes    Exercise recommendations are:  If unable to walk, then the patient can exercise in a chair 3 times a day. By flapping arms like a bird gently and raising legs outwards to the front.  If ambulatory, the patient  can go for walks for 30 minutes 3 times a week. Then increase the intensity and duration as tolerated.  Goal is to try to attain exercise frequency to 5 times a week.  If applicable: Best to perform resistance exercises (machines or weights) 2 days a week and cardio type exercises 3 days per week.  

## 2013-11-11 LAB — CMP14+EGFR
ALT: 22 IU/L (ref 0–32)
AST: 25 IU/L (ref 0–40)
Albumin/Globulin Ratio: 2 (ref 1.1–2.5)
Albumin: 4.6 g/dL (ref 3.6–4.8)
Alkaline Phosphatase: 70 IU/L (ref 39–117)
BUN/Creatinine Ratio: 16 (ref 11–26)
BUN: 13 mg/dL (ref 8–27)
CO2: 28 mmol/L (ref 18–29)
Calcium: 9.9 mg/dL (ref 8.6–10.2)
Chloride: 102 mmol/L (ref 97–108)
Creatinine, Ser: 0.8 mg/dL (ref 0.57–1.00)
GFR calc Af Amer: 88 mL/min/{1.73_m2} (ref >59)
GFR calc non Af Amer: 76 mL/min/{1.73_m2} (ref >59)
Globulin, Total: 2.3 g/dL (ref 1.5–4.5)
Glucose: 100 mg/dL — ABNORMAL HIGH (ref 65–99)
Potassium: 5.1 mmol/L (ref 3.5–5.2)
Sodium: 143 mmol/L (ref 134–144)
Total Bilirubin: 0.4 mg/dL (ref 0.0–1.2)
Total Protein: 6.9 g/dL (ref 6.0–8.5)

## 2013-11-11 LAB — NMR, LIPOPROFILE
Cholesterol: 215 mg/dL — ABNORMAL HIGH (ref <200)
HDL Cholesterol by NMR: 80 mg/dL (ref >=40)
HDL Particle Number: 44.2 umol/L (ref >=30.5)
LDL Particle Number: 1359 nmol/L — ABNORMAL HIGH (ref <1000)
LDL Size: 21.8 nm (ref >20.5)
LDLC SERPL CALC-MCNC: 119 mg/dL — ABNORMAL HIGH (ref <100)
LP-IR Score: <25 (ref <=45)
Small LDL Particle Number: <90 nmol/L (ref <=527)
Triglycerides by NMR: 79 mg/dL (ref <150)

## 2014-08-06 ENCOUNTER — Telehealth: Payer: Self-pay | Admitting: Family

## 2014-08-07 ENCOUNTER — Ambulatory Visit: Payer: Medicare Other | Admitting: Family

## 2014-08-09 ENCOUNTER — Encounter: Payer: Self-pay | Admitting: Family Medicine

## 2014-08-09 ENCOUNTER — Ambulatory Visit (INDEPENDENT_AMBULATORY_CARE_PROVIDER_SITE_OTHER): Payer: Medicare Other | Admitting: Family Medicine

## 2014-08-09 VITALS — BP 107/74 | HR 89 | Temp 97.3°F | Ht 66.0 in | Wt 116.0 lb

## 2014-08-09 DIAGNOSIS — J449 Chronic obstructive pulmonary disease, unspecified: Secondary | ICD-10-CM

## 2014-08-09 MED ORDER — TIOTROPIUM BROMIDE MONOHYDRATE 18 MCG IN CAPS
18.0000 ug | ORAL_CAPSULE | Freq: Every day | RESPIRATORY_TRACT | Status: DC
Start: 2014-08-09 — End: 2014-08-09

## 2014-08-09 MED ORDER — TIOTROPIUM BROMIDE MONOHYDRATE 18 MCG IN CAPS
18.0000 ug | ORAL_CAPSULE | Freq: Every day | RESPIRATORY_TRACT | Status: DC
Start: 2014-08-09 — End: 2016-08-05

## 2014-08-09 MED ORDER — TIOTROPIUM BROMIDE MONOHYDRATE 18 MCG IN CAPS: 18.0000 ug | ORAL_CAPSULE | Freq: Every day | RESPIRATORY_TRACT | Status: DC

## 2014-08-09 NOTE — Addendum Note (Signed)
Addended by: Gwenith Daily on: 08/09/2014 06:54 PM   Modules accepted: Orders

## 2014-08-09 NOTE — Progress Notes (Signed)
   Subjective:    Patient ID: Shannon Jackson, female    DOB: 1946/03/13, 68 y.o.   MRN: [id number]  HPI Patient is here for refill on spiriva   Review of Systems    No chest pain, SOB, HA, dizziness, vision change, N/V, diarrhea, constipation, dysuria, urinary urgency or frequency, myalgias, arthralgias or rash.  Objective:   Physical Exam  Vital signs noted  Chronically ill appearing female in NAD.  HEENT - Head atraumatic Normocephalic                Eyes - PERRLA, Conjuctiva - clear Sclera- Clear EOMI                Ears - EAC's Wnl TM's Wnl Gross Hearing WNL Respiratory - Lungs CTA bilateral Cardiac - RRR S1 and S2 without murmur     Assessment & Plan:  Chronic obstructive pulmonary disease, unspecified COPD, unspecified chronic bronchitis type - Plan: tiotropium (SPIRIVA) 18 MCG inhalation capsule, DISCONTINUED: tiotropium (SPIRIVA) 18 MCG inhalation capsule  Deatra Canter FNP

## 2014-08-19 NOTE — Telephone Encounter (Signed)
Several attempts have been made to contact patient.  

## 2014-11-06 ENCOUNTER — Other Ambulatory Visit: Payer: Self-pay | Admitting: *Deleted

## 2014-11-07 ENCOUNTER — Ambulatory Visit (INDEPENDENT_AMBULATORY_CARE_PROVIDER_SITE_OTHER): Payer: Medicare Other | Admitting: Family Medicine

## 2014-11-07 ENCOUNTER — Encounter: Payer: Self-pay | Admitting: Family Medicine

## 2014-11-07 VITALS — BP 123/69 | HR 97 | Temp 96.7°F | Ht 66.0 in | Wt 119.0 lb

## 2014-11-07 DIAGNOSIS — B370 Candidal stomatitis: Secondary | ICD-10-CM

## 2014-11-07 DIAGNOSIS — F411 Generalized anxiety disorder: Secondary | ICD-10-CM

## 2014-11-07 DIAGNOSIS — K2100 Gastro-esophageal reflux disease with esophagitis, without bleeding: Secondary | ICD-10-CM

## 2014-11-07 MED ORDER — NYSTATIN 100000 UNIT/ML MT SUSP: 5.0000 mL | Freq: Four times a day (QID) | OROMUCOSAL | Status: DC

## 2014-11-07 MED ORDER — SERTRALINE HCL 50 MG PO TABS: 50.0000 mg | ORAL_TABLET | Freq: Every day | ORAL | Status: DC

## 2014-11-07 MED ORDER — PANTOPRAZOLE SODIUM 40 MG PO TBEC: 40.0000 mg | DELAYED_RELEASE_TABLET | Freq: Every day | ORAL | Status: DC

## 2014-11-07 NOTE — Progress Notes (Signed)
   Subjective:    Patient ID: Shannon Jackson, female    DOB: 1946-10-29, 68 y.o.   MRN: [id number]  HPI Patient is here for c/o thrush. She has copd and uses inhaled corticoid steroid and she is having thrush.  She is c/o depression and panic sx's.  She has severe COPD and has anxiety due to her breathing.  She uses clonazepam prn.  She is needing refills on protonix.  Review of Systems  Constitutional: Negative for fever.  HENT: Negative for ear pain.   Eyes: Negative for discharge.  Respiratory: Negative for cough.   Cardiovascular: Negative for chest pain.  Gastrointestinal: Negative for abdominal distention.  Endocrine: Negative for polyuria.  Genitourinary: Negative for difficulty urinating.  Musculoskeletal: Negative for gait problem and neck pain.  Skin: Negative for color change and rash.  Neurological: Negative for speech difficulty and headaches.  Psychiatric/Behavioral: Negative for agitation.       Objective:    BP 123/69 mmHg  Pulse 97  Temp(Src) 96.7 F (35.9 C) (Oral)  Ht 5\' 6"  (1.676 m)  Wt 119 lb (53.978 kg)  BMI 19.22 kg/m2 Physical Exam  Constitutional: She is oriented to person, place, and time. She appears well-developed and well-nourished.  HENT:  Head: Normocephalic and atraumatic.  Mouth/Throat: Oropharynx is clear and moist.  Eyes: Pupils are equal, round, and reactive to light.  Neck: Normal range of motion. Neck supple.  Cardiovascular: Normal rate and regular rhythm.   No murmur heard. Pulmonary/Chest: Effort normal and breath sounds normal.  Abdominal: Soft. Bowel sounds are normal. There is no tenderness.  Neurological: She is alert and oriented to person, place, and time.  Skin: Skin is warm and dry.  Psychiatric: She has a normal mood and affect.          Assessment & Plan:     ICD-9-CM ICD-10-CM   1. Gastroesophageal reflux disease with esophagitis 530.11 K21.0 pantoprazole (PROTONIX) 40 MG tablet     sertraline (ZOLOFT) 50 MG  tablet  2. GAD (generalized anxiety disorder) 300.02 F41.1 sertraline (ZOLOFT) 50 MG tablet  3. Thrush 112.0 B37.0 nystatin (MYCOSTATIN) 100000 UNIT/ML suspension   3. Thrush - Nystatin oral solution  Return if symptoms worsen or fail to improve.  Deatra CanterWilliam J Oxford FNP

## 2014-12-04 ENCOUNTER — Encounter: Payer: Self-pay | Admitting: Family Medicine

## 2014-12-04 ENCOUNTER — Ambulatory Visit (INDEPENDENT_AMBULATORY_CARE_PROVIDER_SITE_OTHER): Payer: Medicare Other | Admitting: Family Medicine

## 2014-12-04 VITALS — BP 109/70 | HR 77 | Temp 97.9°F | Ht 66.0 in | Wt 118.0 lb

## 2014-12-04 DIAGNOSIS — E785 Hyperlipidemia, unspecified: Secondary | ICD-10-CM

## 2014-12-04 DIAGNOSIS — Z0000 Encounter for general adult medical examination without abnormal findings: Secondary | ICD-10-CM

## 2014-12-04 DIAGNOSIS — R5383 Other fatigue: Secondary | ICD-10-CM

## 2014-12-04 DIAGNOSIS — J440 Chronic obstructive pulmonary disease with acute lower respiratory infection: Secondary | ICD-10-CM

## 2014-12-04 DIAGNOSIS — E559 Vitamin D deficiency, unspecified: Secondary | ICD-10-CM

## 2014-12-04 LAB — POCT CBC
Granulocyte percent: 68.2 %G (ref 37–80)
HCT, POC: 49.1 % — AB (ref 37.7–47.9)
Hemoglobin: 15.5 g/dL (ref 12.2–16.2)
Lymph, poc: 1.7 (ref 0.6–3.4)
MCH, POC: 30.1 pg (ref 27–31.2)
MCHC: 31.5 g/dL — AB (ref 31.8–35.4)
MCV: 95.7 fL (ref 80–97)
MPV: 7.6 fL (ref 0–99.8)
POC Granulocyte: 4.5 (ref 2–6.9)
POC LYMPH PERCENT: 25.7 %L (ref 10–50)
Platelet Count, POC: 238 10*3/uL (ref 142–424)
RBC: 5.1 M/uL (ref 4.04–5.48)
RDW, POC: 12.7 %
WBC: 6.6 10*3/uL (ref 4.6–10.2)

## 2014-12-04 MED ORDER — BUDESONIDE-FORMOTEROL FUMARATE 160-4.5 MCG/ACT IN AERO: 2.0000 | INHALATION_SPRAY | Freq: Two times a day (BID) | RESPIRATORY_TRACT | Status: DC

## 2014-12-04 NOTE — Progress Notes (Signed)
   Subjective:    Patient ID: Shannon Jackson, female    DOB: 1946-05-15, 68 y.o.   MRN: [id number]  HPI Patient is here for follow up.  She has started on sertraline 39m po qd.  She has hx of copd and needs refills on her symbicort.  She is feeling better since starting sertraline.  She has not had labs in over a year.  Review of Systems  Constitutional: Negative for fever.  HENT: Negative for ear pain.   Eyes: Negative for discharge.  Respiratory: Negative for cough.   Cardiovascular: Negative for chest pain.  Gastrointestinal: Negative for abdominal distention.  Endocrine: Negative for polyuria.  Genitourinary: Negative for difficulty urinating.  Musculoskeletal: Negative for gait problem and neck pain.  Skin: Negative for color change and rash.  Neurological: Negative for speech difficulty and headaches.  Psychiatric/Behavioral: Negative for agitation.       Objective:    BP 109/70 mmHg  Pulse 77  Temp(Src) 97.9 F (36.6 C) (Oral)  Ht _0  (1.676 m)  Wt 118 lb (53.524 kg)  BMI 19.05 kg/m2 Physical Exam  Constitutional: She is oriented to person, place, and time. She appears well-developed and well-nourished.  HENT:  Head: Normocephalic and atraumatic.  Mouth/Throat: Oropharynx is clear and moist.  Eyes: Pupils are equal, round, and reactive to light.  Neck: Normal range of motion. Neck supple.  Cardiovascular: Normal rate and regular rhythm.   No murmur heard. Pulmonary/Chest: Effort normal and breath sounds normal.  Abdominal: Soft. Bowel sounds are normal. There is no tenderness.  Neurological: She is alert and oriented to person, place, and time.  Skin: Skin is warm and dry.  Psychiatric: She has a normal mood and affect.          Assessment & Plan:     ICD-9-CM ICD-10-CM   1. Chronic obstructive pulmonary disease with acute lower respiratory infection 496 J44.0 budesonide-formoterol (SYMBICORT) 160-4.5 MCG/ACT inhaler   519.8  POCT CBC     CMP14+EGFR   Lipid panel     Thyroid Panel With TSH     Vit D  25 hydroxy (rtn osteoporosis monitoring)  2. Routine general medical examination at a health care facility V70.0 Z00.00 POCT CBC     CMP14+EGFR     Lipid panel     Thyroid Panel With TSH     Vit D  25 hydroxy (rtn osteoporosis monitoring)  3. Vitamin D deficiency 268.9 E55.9 Vit D  25 hydroxy (rtn osteoporosis monitoring)  4. Other fatigue 780.79 R53.83 POCT CBC     CMP14+EGFR     Lipid panel     Thyroid Panel With TSH  5. Hyperlipidemia 272.4 E78.5 Lipid panel     No Follow-up on file.  WLysbeth PennerFNP

## 2014-12-05 LAB — CMP14+EGFR
ALT: 20 IU/L (ref 0–32)
AST: 24 IU/L (ref 0–40)
Albumin/Globulin Ratio: 1.8 (ref 1.1–2.5)
Albumin: 4.4 g/dL (ref 3.6–4.8)
Alkaline Phosphatase: 67 IU/L (ref 39–117)
BUN/Creatinine Ratio: 9 — ABNORMAL LOW (ref 11–26)
BUN: 7 mg/dL — ABNORMAL LOW (ref 8–27)
CO2: 28 mmol/L (ref 18–29)
Calcium: 9.6 mg/dL (ref 8.7–10.3)
Chloride: 100 mmol/L (ref 97–108)
Creatinine, Ser: 0.78 mg/dL (ref 0.57–1.00)
GFR calc Af Amer: 90 mL/min/{1.73_m2} (ref >59)
GFR calc non Af Amer: 78 mL/min/{1.73_m2} (ref >59)
Globulin, Total: 2.5 g/dL (ref 1.5–4.5)
Glucose: 78 mg/dL (ref 65–99)
Potassium: 4.8 mmol/L (ref 3.5–5.2)
Sodium: 142 mmol/L (ref 134–144)
Total Bilirubin: 0.4 mg/dL (ref 0.0–1.2)
Total Protein: 6.9 g/dL (ref 6.0–8.5)

## 2014-12-05 LAB — THYROID PANEL WITH TSH
Free Thyroxine Index: 3 (ref 1.2–4.9)
T3 Uptake Ratio: 29 % (ref 24–39)
T4, Total: 10.2 ug/dL (ref 4.5–12.0)
TSH: 0.887 u[IU]/mL (ref 0.450–4.500)

## 2014-12-05 LAB — LIPID PANEL
Chol/HDL Ratio: 2.5 ratio units (ref 0.0–4.4)
Cholesterol, Total: 195 mg/dL (ref 100–199)
HDL: 78 mg/dL (ref >39)
LDL Calculated: 96 mg/dL (ref 0–99)
Triglycerides: 107 mg/dL (ref 0–149)
VLDL Cholesterol Cal: 21 mg/dL (ref 5–40)

## 2014-12-05 LAB — VITAMIN D 25 HYDROXY (VIT D DEFICIENCY, FRACTURES): Vit D, 25-Hydroxy: 16.7 ng/mL — ABNORMAL LOW (ref 30.0–100.0)

## 2014-12-06 ENCOUNTER — Other Ambulatory Visit: Payer: Self-pay | Admitting: Family Medicine

## 2014-12-06 MED ORDER — VITAMIN D (ERGOCALCIFEROL) 1.25 MG (50000 UNIT) PO CAPS: 50000.0000 [IU] | ORAL_CAPSULE | ORAL | Status: DC

## 2015-02-24 ENCOUNTER — Telehealth: Payer: Self-pay | Admitting: Family Medicine

## 2015-02-25 NOTE — Telephone Encounter (Signed)
Appt scheduled. Patient and family aware.

## 2015-03-03 ENCOUNTER — Ambulatory Visit: Payer: Medicare Other | Admitting: Family Medicine

## 2015-03-17 ENCOUNTER — Telehealth: Payer: Self-pay | Admitting: Family Medicine

## 2015-03-17 NOTE — Telephone Encounter (Signed)
Appointment rescheduled for 4/22 with Hyacinth MeekerMiller

## 2015-03-20 ENCOUNTER — Ambulatory Visit: Payer: Medicare Other | Admitting: Family Medicine

## 2015-03-28 ENCOUNTER — Ambulatory Visit (INDEPENDENT_AMBULATORY_CARE_PROVIDER_SITE_OTHER): Payer: Medicare Other | Admitting: Family Medicine

## 2015-03-28 ENCOUNTER — Encounter: Payer: Self-pay | Admitting: Family Medicine

## 2015-03-28 VITALS — BP 120/73 | HR 88 | Temp 97.0°F | Ht 66.0 in | Wt 113.0 lb

## 2015-03-28 DIAGNOSIS — J449 Chronic obstructive pulmonary disease, unspecified: Secondary | ICD-10-CM | POA: Diagnosis not present

## 2015-03-28 NOTE — Progress Notes (Signed)
Subjective:    Patient ID: Shannon Jackson, female    DOB: 02-18-1946, 69 y.o.   MRN: [id number]  HPI  69 year old female with COPD for follow-up. She was admitted to the hospital back in February with an exacerbation of COPD and then readmitted in March for what turned out to be pulmonary embolus and started on Xarelto. There was no history of DVT. Since her hospitalization she has been taking Xarelto without problem, but she is also on prednisone every other day as well as azithromycin every other day. We had a long discussion about the appropriateness of chronic steroids and antibiotics. She would rather not take these and I have told her that most physicians would agree that antibiotics do not work prophylactically.  She is also been on replacement doses of vitamin D at 50,000 units and expresses an interest in dropping back to maintenance doses there. I'm in agreement with doing that without rechecking vitamin D level  Chief Complaint  Patient presents with  . Hospitalization Follow-up    Discharged from [facility] on 02/24/15. Diagnosis was pulmonary embolism.     Patient Active Problem List   Diagnosis Date Noted  . Allergic rhinitis   . Arthritis   . Osteopenia   . Hyperlipidemia   . Raynaud's disease 06/28/2013  . COPD (chronic obstructive pulmonary disease) 11/10/2011   Outpatient Encounter Prescriptions as of 03/28/2015  Medication Sig  . albuterol (ACCUNEB) 1.25 MG/3ML nebulizer solution Take 1 ampule by nebulization 3 (three) times daily as needed for wheezing.  Marland Kitchen albuterol (PROVENTIL HFA;VENTOLIN HFA) 108 (90 BASE) MCG/ACT inhaler Inhale 2 puffs into the lungs every 6 (six) hours as needed.    Marland Kitchen azithromycin (ZITHROMAX) 250 MG tablet Take 250 mg by mouth daily. Mon, Wed, Fri  . budesonide-formoterol (SYMBICORT) 160-4.5 MCG/ACT inhaler Inhale 2 puffs into the lungs 2 (two) times daily.  . calcium-vitamin D (OSCAL WITH D) 250-125 MG-UNIT per tablet Take 1 tablet by mouth daily.      . clonazePAM (KLONOPIN) 0.5 MG tablet   . Multiple Vitamin (MULTIVITAMIN) tablet Take 1 tablet by mouth daily.    . pantoprazole (PROTONIX) 40 MG tablet Take 1 tablet (40 mg total) by mouth daily.  . predniSONE (DELTASONE) 10 MG tablet Take 10 mg by mouth every other day.   . Pseudoephedrine-Guaifenesin (MUCINEX D) [phone] MG TB12 Take 1 tablet by mouth.  . sertraline (ZOLOFT) 50 MG tablet Take 1 tablet (50 mg total) by mouth daily.  Marland Kitchen tiotropium (SPIRIVA) 18 MCG inhalation capsule Place 1 capsule (18 mcg total) into inhaler and inhale daily.  . Vitamin D, Ergocalciferol, (DRISDOL) 50000 UNITS CAPS capsule Take 1 capsule (50,000 Units total) by mouth every 7 (seven) days.  Carlena Hurl 20 MG TABS tablet Take 20 mg by mouth daily.      Review of Systems  Constitutional: Negative.   HENT: Negative.   Respiratory: Positive for shortness of breath.   Cardiovascular: Negative.   Musculoskeletal: Negative.   Psychiatric/Behavioral: Negative.        Objective:   Physical Exam  Constitutional: She is oriented to person, place, and time. She appears well-developed and well-nourished.  Cardiovascular: Normal rate, regular rhythm and intact distal pulses.   Pulmonary/Chest: Effort normal and breath sounds normal.  Neurological: She is alert and oriented to person, place, and time.   BP 120/73 mmHg  Pulse 88  Temp(Src) 97 F (36.1 C) (Oral)  Ht  (1.676 m)  Wt 113 lb (51.256 kg)  BMI  18.25 kg/m2        Assessment & Plan:  1. Chronic obstructive pulmonary disease, unspecified COPD, unspecified chronic bronchitis type COPD is stable at present. As noted above we had discussion about continuation of chronic steroid use as well as antibiotic use and I would be in favor of her discontinuing both. She will have to make the decision ultimately but I would support her not taking these medicines chronically but continuing with her maintenance medicines for COPD including Spiriva,  Symbicort, and as needed albuterol  Frederica KusterStephen M Miller MD

## 2015-10-06 ENCOUNTER — Encounter: Payer: Self-pay | Admitting: Pediatrics

## 2015-10-06 ENCOUNTER — Ambulatory Visit (INDEPENDENT_AMBULATORY_CARE_PROVIDER_SITE_OTHER): Payer: Medicare Other | Admitting: Pediatrics

## 2015-10-06 VITALS — BP 108/61 | HR 96 | Temp 97.1°F | Ht 66.0 in | Wt 121.6 lb

## 2015-10-06 DIAGNOSIS — L853 Xerosis cutis: Secondary | ICD-10-CM

## 2015-10-06 NOTE — Progress Notes (Signed)
Subjective:    Patient ID: Shannon Jackson, female    DOB: 07/08/1946, [age] y.o.   MRN: [id number]  CC: rash, ?shingles  HPI: Shannon GoldMary Janus is a 69 y.o. female presenting on 10/06/2015 for Pain Left Hip and Rash  Noticed some itching and pain along underwear waistband several daysa go. No rash, some redness Not getting any worse Not putting anything on it H/o COPD, on oxygen much of the day. Gets SOB with talking for periods of time.  On prednisone for breathing troubles, so did not get shingles vaccine No fevers   Relevant past medical, surgical, family and social history reviewed and updated as indicated. Interim medical history since our last visit reviewed. Allergies and medications reviewed and updated.   ROS: Per HPI unless specifically indicated above  Past Medical History Patient Active Problem List   Diagnosis Date Noted  . Allergic rhinitis   . Arthritis   . Osteopenia   . Hyperlipidemia   . Raynaud's disease 06/28/2013  . COPD (chronic obstructive pulmonary disease) (HCC) 11/10/2011    Current Outpatient Prescriptions  Medication Sig Dispense Refill  . albuterol (ACCUNEB) 1.25 MG/3ML nebulizer solution Take 1 ampule by nebulization 3 (three) times daily as needed for wheezing.    Marland Kitchen. albuterol (PROVENTIL HFA;VENTOLIN HFA) 108 (90 BASE) MCG/ACT inhaler Inhale 2 puffs into the lungs every 6 (six) hours as needed.      . budesonide-formoterol (SYMBICORT) 160-4.5 MCG/ACT inhaler Inhale 2 puffs into the lungs 2 (two) times daily. 1 Inhaler 4  . clonazePAM (KLONOPIN) 0.5 MG tablet   5  . Multiple Vitamin (MULTIVITAMIN) tablet Take 1 tablet by mouth daily.      . pantoprazole (PROTONIX) 40 MG tablet Take 1 tablet (40 mg total) by mouth daily. 30 tablet 11  . predniSONE (DELTASONE) 10 MG tablet Take 10 mg by mouth every other day.     . Pseudoephedrine-Guaifenesin (MUCINEX D) [phone] MG TB12 Take 1 tablet by mouth.    . sertraline (ZOLOFT) 50 MG tablet Take 1 tablet (50 mg  total) by mouth daily. 30 tablet 11  . tiotropium (SPIRIVA) 18 MCG inhalation capsule Place 1 capsule (18 mcg total) into inhaler and inhale daily. 30 capsule 5  . Vitamin D, Ergocalciferol, (DRISDOL) 50000 UNITS CAPS capsule Take 1 capsule (50,000 Units total) by mouth every 7 (seven) days. 18 capsule 0  . XARELTO 20 MG TABS tablet Take 20 mg by mouth daily.     No current facility-administered medications for this visit.       Objective:    BP 108/61 mmHg  Pulse 96  Temp(Src) 97.1 F (36.2 C) (Oral)  Ht 5\' 6"  (1.676 m)  Wt 121 lb 9.6 oz (55.157 kg)  BMI 19.64 kg/m2  Wt Readings from Last 3 Encounters:  10/06/15 121 lb 9.6 oz (55.157 kg)  03/28/15 113 lb (51.256 kg)  12/04/14 118 lb (53.524 kg)     Gen: NAD, alert, cooperative with exam, NCAT EYES: EOMI, no scleral injection or icterus ENT:   OP without erythema CV: NRRR, normal S1/S2, no murmur, distal pulses 2+ b/l Resp: CTABL, no wheezes, normal WOB Ext: No edema, warm Neuro: Alert and oriented Skin: thin, cool, dry skin below tight waistband of underwear. Minimal redness, some excoriations with break in skin. No vesicles, no defined rash.     Assessment & Plan:   Corrie DandyMary was seen today for dry itchy skin around waist. Tight underwear band, itching worst under band. No other rash/vesicles. Likely  from xerosis, discussed TID moisturizing, wearing underwear with wider/looser waistband so doesn't cut into skin as much. WIll let us know if further rash develops. Breathing has been at baseline.  Diagnoses and all orders for this visit:  Xerosis of skin   Follow up plan: As scheduled  Rex Kras, MD [facility] 10/06/2015, 2:26 PM

## 2015-10-06 NOTE — Patient Instructions (Signed)
Use aveeno or similar moisturizer three times a day

## 2015-10-09 ENCOUNTER — Telehealth: Payer: Self-pay | Admitting: Pediatrics

## 2015-10-09 DIAGNOSIS — B029 Zoster without complications: Secondary | ICD-10-CM

## 2015-10-09 MED ORDER — VALACYCLOVIR HCL 1 G PO TABS: 1000.0000 mg | ORAL_TABLET | Freq: Three times a day (TID) | ORAL | Status: DC

## 2015-10-09 NOTE — Telephone Encounter (Signed)
Woke up this morning with pins and needles in a band along L of her side in a band at top of underwear. Has some blisters, looks like "measles". Will send in valtrex to treat shingles. Will let me know if pain is uncontrolled.

## 2015-11-11 ENCOUNTER — Other Ambulatory Visit: Payer: Self-pay

## 2015-11-11 DIAGNOSIS — K2100 Gastro-esophageal reflux disease with esophagitis, without bleeding: Secondary | ICD-10-CM

## 2015-11-11 MED ORDER — PANTOPRAZOLE SODIUM 40 MG PO TBEC: 40.0000 mg | DELAYED_RELEASE_TABLET | Freq: Every day | ORAL | Status: DC

## 2016-01-07 ENCOUNTER — Other Ambulatory Visit: Payer: Self-pay | Admitting: Pediatrics

## 2016-01-21 ENCOUNTER — Telehealth: Payer: Self-pay | Admitting: Family Medicine

## 2016-01-21 NOTE — Telephone Encounter (Signed)
Denied.

## 2016-02-05 ENCOUNTER — Telehealth: Payer: Self-pay

## 2016-02-05 NOTE — Telephone Encounter (Signed)
Insurance prior authorized Symbicort

## 2016-03-10 ENCOUNTER — Ambulatory Visit: Payer: Medicare Other | Admitting: Family Medicine

## 2016-05-24 ENCOUNTER — Telehealth: Payer: Self-pay | Admitting: Family Medicine

## 2016-08-05 ENCOUNTER — Ambulatory Visit (INDEPENDENT_AMBULATORY_CARE_PROVIDER_SITE_OTHER): Payer: Medicare Other

## 2016-08-05 ENCOUNTER — Encounter: Payer: Self-pay | Admitting: Pharmacist

## 2016-08-05 ENCOUNTER — Ambulatory Visit (INDEPENDENT_AMBULATORY_CARE_PROVIDER_SITE_OTHER): Payer: Medicare Other | Admitting: Pharmacist

## 2016-08-05 VITALS — BP 102/62 | HR 82 | Ht 66.0 in | Wt 121.0 lb

## 2016-08-05 DIAGNOSIS — E559 Vitamin D deficiency, unspecified: Secondary | ICD-10-CM

## 2016-08-05 DIAGNOSIS — Z0000 Encounter for general adult medical examination without abnormal findings: Secondary | ICD-10-CM

## 2016-08-05 DIAGNOSIS — Z79899 Other long term (current) drug therapy: Secondary | ICD-10-CM

## 2016-08-05 DIAGNOSIS — Z1159 Encounter for screening for other viral diseases: Secondary | ICD-10-CM

## 2016-08-05 DIAGNOSIS — Z7952 Long term (current) use of systemic steroids: Secondary | ICD-10-CM

## 2016-08-05 DIAGNOSIS — Z780 Asymptomatic menopausal state: Secondary | ICD-10-CM

## 2016-08-05 DIAGNOSIS — E785 Hyperlipidemia, unspecified: Secondary | ICD-10-CM

## 2016-08-05 NOTE — Patient Instructions (Addendum)
Shannon Jackson , Thank you for taking time to come for your Medicare Wellness Visit. I appreciate your ongoing commitment to your health goals. Please review the following plan we discussed and let me know if I can assist you in the future.    This is a list of the screening recommended for you and due dates:  Health Maintenance  Topic Date Due  .  Hepatitis C: One time screening is recommended by Center for Disease Control  (CDC) for  adults born from [birth year] through 1966.   Checking today  . Shingles Vaccine  07/26/2005 - postponed for now - recent history of shingles  . DEXA scan (bone density measurement)  Done today  . Pneumonia vaccines (2 of 2 - PCV13) 09/06/2011 - checking with [facility]  . Colon Cancer Screening  06/06/2015 - declined  . Mammogram  12/12/2015 - declined  . Flu Shot  07/06/2016  . Tetanus Vaccine  06/06/2019   Health Maintenance, Female Adopting a healthy lifestyle and getting preventive care can go a long way to promote health and wellness. Talk with your health care provider about what schedule of regular examinations is right for you. This is a good chance for you to check in with your provider about disease prevention and staying healthy. In between checkups, there are plenty of things you can do on your own. Experts have done a lot of research about which lifestyle changes and preventive measures are most likely to keep you healthy. Ask your health care provider for more information. WEIGHT AND DIET  Eat a healthy diet  Be sure to include plenty of vegetables, fruits, low-fat dairy products, and lean protein.  Do not eat a lot of foods high in solid fats, added sugars, or salt.  Get regular exercise. This is one of the most important things you can do for your health.  Most adults should exercise for at least 150 minutes each week. The exercise should increase your heart rate and make you sweat (moderate-intensity exercise).  Most adults should also do  strengthening exercises at least twice a week. This is in addition to the moderate-intensity exercise.  Maintain a healthy weight  Body mass index (BMI) is a measurement that can be used to identify possible weight problems. It estimates body fat based on height and weight. Your health care provider can help determine your BMI and help you achieve or maintain a healthy weight.  For females 66 years of age and older:   A BMI below 18.5 is considered underweight.  A BMI of 18.5 to 24.9 is normal.  A BMI of 25 to 29.9 is considered overweight.  A BMI of 30 and above is considered obese.  Watch levels of cholesterol and blood lipids  You should start having your blood tested for lipids and cholesterol at 71 years of age, then have this test every 5 years.  You may need to have your cholesterol levels checked more often if:  Your lipid or cholesterol levels are high.  You are older than 71 years of age.  You are at high risk for heart disease.  CANCER SCREENING   Lung Cancer  Lung cancer screening is recommended for adults 83-62 years old who are at high risk for lung cancer because of a history of smoking.  A yearly low-dose CT scan of the lungs is recommended for people who:  Currently smoke.  Have quit within the past 15 years.  Have at least a 30-pack-year history of smoking.  A pack year is smoking an average of one pack of cigarettes a day for 1 year.  Yearly screening should continue until it has been 15 years since you quit.  Yearly screening should stop if you develop a health problem that would prevent you from having lung cancer treatment.  Breast Cancer  Practice breast self-awareness. This means understanding how your breasts normally appear and feel.  It also means doing regular breast self-exams. Let your health care provider know about any changes, no matter how small.  If you are in your 20s or 30s, you should have a clinical breast exam (CBE) by a  health care provider every 1-3 years as part of a regular health exam.  If you are 76 or older, have a CBE every year. Also consider having a breast X-ray (mammogram) every year.  If you have a family history of breast cancer, talk to your health care provider about genetic screening.  If you are at high risk for breast cancer, talk to your health care provider about having an MRI and a mammogram every year.  Breast cancer gene (BRCA) assessment is recommended for women who have family members with BRCA-related cancers. BRCA-related cancers include:  Breast.  Ovarian.  Tubal.  Peritoneal cancers.  Results of the assessment will determine the need for genetic counseling and BRCA1 and BRCA2 testing. Cervical Cancer Your health care provider may recommend that you be screened regularly for cancer of the pelvic organs (ovaries, uterus, and vagina). This screening involves a pelvic examination, including checking for microscopic changes to the surface of your cervix (Pap test). You may be encouraged to have this screening done every 3 years, beginning at age 67.  For women ages 70-65, health care providers may recommend pelvic exams and Pap testing every 3 years, or they may recommend the Pap and pelvic exam, combined with testing for human papilloma virus (HPV), every 5 years. Some types of HPV increase your risk of cervical cancer. Testing for HPV may also be done on women of any age with unclear Pap test results.  Other health care providers may not recommend any screening for nonpregnant women who are considered low risk for pelvic cancer and who do not have symptoms. Ask your health care provider if a screening pelvic exam is right for you.  If you have had past treatment for cervical cancer or a condition that could lead to cancer, you need Pap tests and screening for cancer for at least 20 years after your treatment. If Pap tests have been discontinued, your risk factors (such as having a  new sexual partner) need to be reassessed to determine if screening should resume. Some women have medical problems that increase the chance of getting cervical cancer. In these cases, your health care provider may recommend more frequent screening and Pap tests. Colorectal Cancer  This type of cancer can be detected and often prevented.  Routine colorectal cancer screening usually begins at 71 years of age and continues through 71 years of age.  Your health care provider may recommend screening at an earlier age if you have risk factors for colon cancer.  Your health care provider may also recommend using home test kits to check for hidden blood in the stool.  A small camera at the end of a tube can be used to examine your colon directly (sigmoidoscopy or colonoscopy). This is done to check for the earliest forms of colorectal cancer.  Routine screening usually begins at age 37.  Direct examination of the colon should be repeated every 5-10 years through 71 years of age. However, you may need to be screened more often if early forms of precancerous polyps or small growths are found. Skin Cancer  Check your skin from head to toe regularly.  Tell your health care provider about any new moles or changes in moles, especially if there is a change in a mole's shape or color.  Also tell your health care provider if you have a mole that is larger than the size of a pencil eraser.  Always use sunscreen. Apply sunscreen liberally and repeatedly throughout the day.  Protect yourself by wearing long sleeves, pants, a wide-brimmed hat, and sunglasses whenever you are outside. HEART DISEASE, DIABETES, AND HIGH BLOOD PRESSURE   High blood pressure causes heart disease and increases the risk of stroke. High blood pressure is more likely to develop in:  People who have blood pressure in the high end of the normal range (130-139/85-89 mm Hg).  People who are overweight or obese.  People who are  African American.  If you are 55-63 years of age, have your blood pressure checked every 3-5 years. If you are 71 years of age or older, have your blood pressure checked every year. You should have your blood pressure measured twice--once when you are at a hospital or clinic, and once when you are not at a hospital or clinic. Record the average of the two measurements. To check your blood pressure when you are not at a hospital or clinic, you can use:  An automated blood pressure machine at a pharmacy.  A home blood pressure monitor.  If you are between 77 years and 44 years old, ask your health care provider if you should take aspirin to prevent strokes.  Have regular diabetes screenings. This involves taking a blood sample to check your fasting blood sugar level.  If you are at a normal weight and have a low risk for diabetes, have this test once every three years after 71 years of age.  If you are overweight and have a high risk for diabetes, consider being tested at a younger age or more often. PREVENTING INFECTION  Hepatitis B  If you have a higher risk for hepatitis B, you should be screened for this virus. You are considered at high risk for hepatitis B if:  You were born in a country where hepatitis B is common. Ask your health care provider which countries are considered high risk.  Your parents were born in a high-risk country, and you have not been immunized against hepatitis B (hepatitis B vaccine).  You have HIV or AIDS.  You use needles to inject street drugs.  You live with someone who has hepatitis B.  You have had sex with someone who has hepatitis B.  You get hemodialysis treatment.  You take certain medicines for conditions, including cancer, organ transplantation, and autoimmune conditions. Hepatitis C  Blood testing is recommended for:  Everyone born from [birth year] through 1965.  Anyone with known risk factors for hepatitis C. Sexually transmitted infections  (STIs)  You should be screened for sexually transmitted infections (STIs) including gonorrhea and chlamydia if:  You are sexually active and are younger than 71 years of age.  You are older than 71 years of age and your health care provider tells you that you are at risk for this type of infection.  Your sexual activity has changed since you were last screened and you are  at an increased risk for chlamydia or gonorrhea. Ask your health care provider if you are at risk.  If you do not have HIV, but are at risk, it may be recommended that you take a prescription medicine daily to prevent HIV infection. This is called pre-exposure prophylaxis (PrEP). You are considered at risk if:  You are sexually active and do not regularly use condoms or know the HIV status of your partner(s).  You take drugs by injection.  You are sexually active with a partner who has HIV. Talk with your health care provider about whether you are at high risk of being infected with HIV. If you choose to begin PrEP, you should first be tested for HIV. You should then be tested every 3 months for as long as you are taking PrEP.  PREGNANCY   If you are premenopausal and you may become pregnant, ask your health care provider about preconception counseling.  If you may become pregnant, take 400 to 800 micrograms (mcg) of folic acid every day.  If you want to prevent pregnancy, talk to your health care provider about birth control (contraception). OSTEOPOROSIS AND MENOPAUSE   Osteoporosis is a disease in which the bones lose minerals and strength with aging. This can result in serious bone fractures. Your risk for osteoporosis can be identified using a bone density scan.  If you are 31 years of age or older, or if you are at risk for osteoporosis and fractures, ask your health care provider if you should be screened.  Ask your health care provider whether you should take a calcium or vitamin D supplement to lower your risk  for osteoporosis.  Menopause may have certain physical symptoms and risks.  Hormone replacement therapy may reduce some of these symptoms and risks. Talk to your health care provider about whether hormone replacement therapy is right for you.  HOME CARE INSTRUCTIONS   Schedule regular health, dental, and eye exams.  Stay current with your immunizations.   Do not use any tobacco products including cigarettes, chewing tobacco, or electronic cigarettes.  If you are pregnant, do not drink alcohol.  If you are breastfeeding, limit how much and how often you drink alcohol.  Limit alcohol intake to no more than 1 drink per day for nonpregnant women. One drink equals 12 ounces of beer, 5 ounces of wine, or 1 ounces of hard liquor.  Do not use street drugs.  Do not share needles.  Ask your health care provider for help if you need support or information about quitting drugs.  Tell your health care provider if you often feel depressed.  Tell your health care provider if you have ever been abused or do not feel safe at home.   This information is not intended to replace advice given to you by your health care provider. Make sure you discuss any questions you have with your health care provider.   Document Released: 06/07/2011 Document Revised: 12/13/2014 Document Reviewed: 10/24/2013 Elsevier Interactive Patient Education [employer].

## 2016-08-05 NOTE — Progress Notes (Signed)
Patient ID: Shannon Jackson, female   DOB: 08-18-1946, 70 y.o.   MRN: [id number]    Subjective:   Shannon Jackson is a 70 y.o. female who presents for an Initial Medicare Annual Wellness Visit.  Shannon Jackson is divorced.  Lives alone and has 1 grown child.  She has severe COPD what requires supplemental O2.  She gets assistance with medical visits and errand from her sisters.   Current Medications (verified) Outpatient Encounter Prescriptions as of 08/05/2016  Medication Sig  . albuterol (ACCUNEB) 1.25 MG/3ML nebulizer solution Take 1 ampule by nebulization 3 (three) times daily as needed for wheezing.  Marland Kitchen albuterol (PROVENTIL HFA;VENTOLIN HFA) 108 (90 BASE) MCG/ACT inhaler Inhale 2 puffs into the lungs every 6 (six) hours as needed.    Marland Kitchen azithromycin (ZITHROMAX) 250 MG tablet Take 1 tablet by mouth every Monday, Wednesday, and Friday.  . clonazePAM (KLONOPIN) 0.5 MG tablet Take 0.5 mg by mouth 2 (two) times daily.   . Nutritional Supplements (ENSURE HIGH PROTEIN PO) Take by mouth daily.  . pantoprazole (PROTONIX) 40 MG tablet Take 1 tablet (40 mg total) by mouth daily.  . predniSONE (DELTASONE) 10 MG tablet Take 10 mg by mouth every other day.   . Pseudoephedrine-Guaifenesin (MUCINEX D) [phone] MG TB12 Take 1 tablet by mouth 2 (two) times daily as needed.   . sertraline (ZOLOFT) 100 MG tablet Take 1 tablet by mouth daily.  Marland Kitchen SPIRIVA RESPIMAT 2.5 MCG/ACT AERS Take 2 puffs by mouth daily.  . SYMBICORT 160-4.5 MCG/ACT inhaler INHALE 2 PUFFS INTO THE LUNGS TWICE DAILY  . Vitamin D, Cholecalciferol, 1000 units CAPS Take 1,000 Units by mouth daily.  Alveda Reasons 20 MG TABS tablet Take 20 mg by mouth daily.  . [DISCONTINUED] Vitamin D, Ergocalciferol, (DRISDOL) 50000 UNITS CAPS capsule Take 1 capsule (50,000 Units total) by mouth every 7 (seven) days.  . [DISCONTINUED] Multiple Vitamin (MULTIVITAMIN) tablet Take 1 tablet by mouth daily.    . [DISCONTINUED] sertraline (ZOLOFT) 50 MG tablet Take 1 tablet (50 mg  total) by mouth daily.  . [DISCONTINUED] tiotropium (SPIRIVA) 18 MCG inhalation capsule Place 1 capsule (18 mcg total) into inhaler and inhale daily. (Patient not taking: Reported on 08/05/2016)  . [DISCONTINUED] valACYclovir (VALTREX) 1000 MG tablet Take 1 tablet (1,000 mg total) by mouth 3 (three) times daily. (Patient not taking: Reported on 08/05/2016)   No facility-administered encounter medications on file as of 08/05/2016.     Allergies (verified) Codeine and Levofloxacin   History: Past Medical History:  Diagnosis Date  . Allergic rhinitis   . Arthritis    left hip  . Cataract   . COPD (chronic obstructive pulmonary disease) ([place])   . GERD (gastroesophageal reflux disease)   . Hyperlipidemia   . Osteopenia   . Pulmonary embolism ([place])   . Raynaud's syndrome    Past Surgical History:  Procedure Laterality Date  . FOOT NEUROMA SURGERY    . TUBAL LIGATION     Family History  Problem Relation Age of Onset  . Melanoma Mother   . Emphysema Father   . Arthritis Father   . Cancer Brother     skin, melanoma   Social History   Occupational History  . retired    Social History Main Topics  . Smoking status: Former Smoker    Packs/day: 1.00    Years: 25.00    Types: Cigarettes    Quit date: 12/06/2006  . Smokeless tobacco: Never Used  . Alcohol use No  .  Drug use: No  . Sexual activity: Not on file    Do you feel safe at home?  Yes Are there smokers in your home (other than you)? No  Dietary issues and exercise activities: Current Exercise Habits: The patient does not participate in regular exercise at present, Exercise limited by: respiratory conditions(s)  Current Dietary habits:  Has low appetite but supplements with Ensure for nutrition.  Patient is aware of increased nutrition needs with of severe COPD.  Objective:    Today's Vitals   08/05/16 1538  BP: 102/62  Pulse: 82  Weight: 121 lb (54.9 kg)  Height: _0  (1.676 m)  PainSc: 6   PainLoc: Chest     Body mass index is 19.53 kg/m.   DEXA results : 08/05/2016 L1-L4 = -2.6 Right neck of hip = -3.5  05/14/2009 L1-L4 = -1.5 Right neck of hip = -2.1  Activities of Daily Living In your present state of health, do you have any difficulty performing the following activities: 08/05/2016  Hearing? N  Vision? N  Difficulty concentrating or making decisions? N  Walking or climbing stairs? Y  Dressing or bathing? N  Doing errands, shopping? Y  Preparing Food and eating ? Y  Using the Toilet? N  In the past six months, have you accidently leaked urine? N  Do you have problems with loss of bowel control? N  Managing your Medications? N  Managing your Finances? N  Housekeeping or managing your Housekeeping? Y  Some recent data might be hidden     Cardiac Risk Factors include: advanced age (>72mn, >>85women);dyslipidemia;sedentary lifestyle  Depression Screen PHQ 2/9 Scores 08/05/2016 10/06/2015 11/07/2014 08/09/2014  PHQ - 2 Score _1 0  PHQ- 9 Score _2 -     Fall Risk Fall Risk  08/05/2016 10/06/2015 11/07/2014 08/09/2014  Falls in the past year? No No No No    Cognitive Function: MMSE - Mini Mental State Exam 08/05/2016  Orientation to time 5  Orientation to Place 5  Registration 3  Attention/ Calculation 4  Recall 3  Language- name 2 objects 2  Language- repeat 1  Language- follow 3 step command 3  Language- read & follow direction 1  Write a sentence 1  Copy design 1  Total score 29    Immunizations and Health Maintenance Immunization History  Administered Date(s) Administered  . DTaP 05/06/2006  . Influenza Split 10/06/2001, 11/05/2006, 09/05/2010  . Influenza Whole 09/06/2011  . Influenza, High Dose Seasonal PF 08/26/2014  . Influenza,inj,Quad PF,36+ Mos 09/09/2015  . Influenza-Unspecified 08/14/2013  . Pneumococcal-Unspecified 09/05/2010   Health Maintenance Due  Topic Date Due  . Hepatitis C Screening  001-Mar-1947 . ZOSTAVAX  07/26/2005  . DEXA  SCAN  07/26/2010  . PNA vac Low Risk Adult (2 of 2 - PCV13) 09/06/2011  . COLONOSCOPY  06/06/2015  . MAMMOGRAM  12/12/2015  . INFLUENZA VACCINE  07/06/2016    Patient Care Team: SWardell Honour MD as PCP - General (Family Medicine) WLelon Perla MD as Referring Physician (Internal Medicine)  Indicate any recent Medical Services you may have received from other than [facility] providers in the past year (date may be approximate).    Assessment:    Annual Wellness Visit  Steroid induced osteoporosis   Screening Tests Health Maintenance  Topic Date Due  . Hepatitis C Screening  007/12/1945 . ZOSTAVAX  07/26/2005  . DEXA SCAN  07/26/2010  . PNA vac Low Risk  Adult (2 of 2 - PCV13) 09/06/2011  . COLONOSCOPY  06/06/2015  . MAMMOGRAM  12/12/2015  . INFLUENZA VACCINE  07/06/2016  . TETANUS/TDAP  06/06/2019        Plan:   During the course of the visit Richanda was educated and counseled about the following appropriate screening and preventive services:   Vaccines to include Pneumoccal, Influenza, Td, Zostavax - will verify Pneumo vaccines from [facility] last year.  Due to chronic prednisone use and recent history of Shingles will hold off on Zostavax.  Colorectal cancer screening - patient declines.  Due to severe COPD she has been advised against unnecessary sedation.  Cardiovascular disease screening - no recent EKG on record.  Recommend at next PCP visit  Diabetes screening - checking BG today  Bone Denisty / Osteoporosis Screening - done today  Discussed treatment for ostoeporosis - patient would like to check with her sister to see which medications she is taking - recommend bisphosphonate, prolia or forteo  Mammogram - patient declined  PAP - patient declined  Nutrition counseling - dicussed COPD and its effects on nutrition needs.  Continue Ensure daily  Advanced Directives - packet given and discussed  Physical Activity - patient not able at this  time.  appt made to see PCP  Orders Placed This Encounter  Procedures  . DG [facility] DEXA    Order Specific Question:   Reason for Exam (SYMPTOM  OR DIAGNOSIS REQUIRED)    Answer:   long term predisone use  . CBC with Differential/Platelet  . CMP14+EGFR  . Lipid panel  . Hepatitis C antibody    Patient Instructions (the written plan) were given to the patient.   Cherre Robins, PharmD   08/06/2016

## 2016-08-06 DIAGNOSIS — M810 Age-related osteoporosis without current pathological fracture: Secondary | ICD-10-CM

## 2016-08-06 HISTORY — DX: Age-related osteoporosis without current pathological fracture: M81.0

## 2016-08-06 LAB — CMP14+EGFR
A/G RATIO: 1.7 (ref 1.2–2.2)
ALBUMIN: 4.3 g/dL (ref 3.5–4.8)
ALT: 15 IU/L (ref 0–32)
AST: 24 IU/L (ref 0–40)
Alkaline Phosphatase: 75 IU/L (ref 39–117)
BUN / CREAT RATIO: 19 (ref 12–28)
BUN: 9 mg/dL (ref 8–27)
CHLORIDE: 93 mmol/L — AB (ref 96–106)
CO2: 33 mmol/L — ABNORMAL HIGH (ref 18–29)
Calcium: 9.5 mg/dL (ref 8.7–10.3)
Creatinine, Ser: 0.48 mg/dL — ABNORMAL LOW (ref 0.57–1.00)
GFR calc non Af Amer: 99 mL/min/{1.73_m2} (ref >59)
GFR, EST AFRICAN AMERICAN: 114 mL/min/{1.73_m2} (ref >59)
GLOBULIN, TOTAL: 2.5 g/dL (ref 1.5–4.5)
GLUCOSE: 111 mg/dL — AB (ref 65–99)
Potassium: 4.9 mmol/L (ref 3.5–5.2)
Sodium: 140 mmol/L (ref 134–144)
TOTAL PROTEIN: 6.8 g/dL (ref 6.0–8.5)

## 2016-08-06 LAB — LIPID PANEL
Chol/HDL Ratio: 2.3 ratio units (ref 0.0–4.4)
Cholesterol, Total: 207 mg/dL — ABNORMAL HIGH (ref 100–199)
HDL: 90 mg/dL (ref >39)
LDL Calculated: 106 mg/dL — ABNORMAL HIGH (ref 0–99)
Triglycerides: 56 mg/dL (ref 0–149)
VLDL CHOLESTEROL CAL: 11 mg/dL (ref 5–40)

## 2016-08-06 LAB — HEPATITIS C ANTIBODY

## 2016-08-06 LAB — CBC WITH DIFFERENTIAL/PLATELET
BASOS: 0 %
Basophils Absolute: 0 10*3/uL (ref 0.0–0.2)
EOS (ABSOLUTE): 0.1 10*3/uL (ref 0.0–0.4)
EOS: 1 %
HEMATOCRIT: 45.2 % (ref 34.0–46.6)
HEMOGLOBIN: 14.5 g/dL (ref 11.1–15.9)
IMMATURE GRANS (ABS): 0 10*3/uL (ref 0.0–0.1)
Immature Granulocytes: 0 %
Lymphocytes Absolute: 0.6 10*3/uL — ABNORMAL LOW (ref 0.7–3.1)
Lymphs: 8 %
MCH: 31.3 pg (ref 26.6–33.0)
MCHC: 32.1 g/dL (ref 31.5–35.7)
MCV: 97 fL (ref 79–97)
MONOCYTES: 4 %
Monocytes Absolute: 0.3 10*3/uL (ref 0.1–0.9)
NEUTROS ABS: 6.4 10*3/uL (ref 1.4–7.0)
Neutrophils: 87 %
Platelets: 214 10*3/uL (ref 150–379)
RBC: 4.64 x10E6/uL (ref 3.77–5.28)
RDW: 12.7 % (ref 12.3–15.4)
WBC: 7.4 10*3/uL (ref 3.4–10.8)

## 2016-08-12 ENCOUNTER — Other Ambulatory Visit: Payer: Self-pay | Admitting: Pharmacist

## 2016-09-16 ENCOUNTER — Telehealth: Payer: Self-pay | Admitting: Pharmacist

## 2016-09-16 MED ORDER — ALENDRONATE SODIUM 70 MG PO TABS: 70.0000 mg | ORAL_TABLET | ORAL | 11 refills | Status: AC

## 2016-09-16 NOTE — Telephone Encounter (Signed)
Patient called to follow up starting medication for bones. She would like to start alendroante.  Rx sent to her pharmacy

## 2016-09-22 ENCOUNTER — Encounter: Payer: Self-pay | Admitting: Family Medicine

## 2016-09-22 ENCOUNTER — Ambulatory Visit (INDEPENDENT_AMBULATORY_CARE_PROVIDER_SITE_OTHER): Payer: Medicare Other | Admitting: Family Medicine

## 2016-09-22 VITALS — BP 112/68 | HR 83 | Temp 97.1°F | Ht 66.0 in | Wt 124.0 lb

## 2016-09-22 DIAGNOSIS — M810 Age-related osteoporosis without current pathological fracture: Secondary | ICD-10-CM | POA: Diagnosis not present

## 2016-09-22 DIAGNOSIS — J449 Chronic obstructive pulmonary disease, unspecified: Secondary | ICD-10-CM

## 2016-09-22 DIAGNOSIS — Z23 Encounter for immunization: Secondary | ICD-10-CM

## 2016-09-22 MED ORDER — ALBUTEROL SULFATE HFA 108 (90 BASE) MCG/ACT IN AERS: 2.0000 | INHALATION_SPRAY | Freq: Four times a day (QID) | RESPIRATORY_TRACT | 1 refills | Status: AC | PRN

## 2016-09-22 NOTE — Progress Notes (Signed)
Subjective:    Patient ID: Shannon Jackson, female    DOB: January 13, 1946, 70 y.o.   MRN: [id number]  HPI 70 year old female who is here to talk about her osteoporosis and use of bisphosphonates. Her T score was -4.6. She is on prednisone for her breathing and her T-scores have markedly declined over the last 2-3 years since she had her last bone density. She has read about side effects of bisphosphonates and we discussed those today. I think she finally is agreeable to taking Fosamax at least on a trial basis to see if she has any of the side effects that she has read about.  Patient Active Problem List   Diagnosis Date Noted  . Allergic rhinitis   . Arthritis   . Osteopenia   . Hyperlipidemia   . Raynaud's disease 06/28/2013  . COPD (chronic obstructive pulmonary disease) (HCC) 11/10/2011   Outpatient Encounter Prescriptions as of 09/22/2016  Medication Sig  . albuterol (ACCUNEB) 1.25 MG/3ML nebulizer solution Take 1 ampule by nebulization 3 (three) times daily as needed for wheezing.  Marland Kitchen albuterol (PROVENTIL HFA;VENTOLIN HFA) 108 (90 BASE) MCG/ACT inhaler Inhale 2 puffs into the lungs every 6 (six) hours as needed.    Marland Kitchen alendronate (FOSAMAX) 70 MG tablet Take 1 tablet (70 mg total) by mouth every 7 (seven) days. Take with a full glass of water on an empty stomach.  Marland Kitchen azithromycin (ZITHROMAX) 250 MG tablet Take 1 tablet by mouth every Monday, Wednesday, and Friday.  . clonazePAM (KLONOPIN) 0.5 MG tablet Take 0.5 mg by mouth 2 (two) times daily.   . Nutritional Supplements (ENSURE HIGH PROTEIN PO) Take by mouth daily.  . pantoprazole (PROTONIX) 40 MG tablet Take 1 tablet (40 mg total) by mouth daily.  . predniSONE (DELTASONE) 10 MG tablet Take 10 mg by mouth every other day.   . Pseudoephedrine-Guaifenesin (MUCINEX D) [phone] MG TB12 Take 1 tablet by mouth 2 (two) times daily as needed.   . sertraline (ZOLOFT) 100 MG tablet Take 1 tablet by mouth daily.  Marland Kitchen SPIRIVA RESPIMAT 2.5 MCG/ACT AERS Take  2 puffs by mouth daily.  . SYMBICORT 160-4.5 MCG/ACT inhaler INHALE 2 PUFFS INTO THE LUNGS TWICE DAILY  . Vitamin D, Cholecalciferol, 1000 units CAPS Take 1,000 Units by mouth daily.  Carlena Hurl 20 MG TABS tablet Take 20 mg by mouth daily.   No facility-administered encounter medications on file as of 09/22/2016.       Review of Systems  Constitutional: Positive for activity change.  HENT: Negative.   Respiratory: Positive for shortness of breath.   Cardiovascular: Negative.   Neurological: Negative.   Psychiatric/Behavioral: Negative.        Objective:   Physical Exam  Constitutional: She is oriented to person, place, and time. She appears well-developed and well-nourished.  HENT:  Left EAC occluded by cerumen and is irrigated today in the office  Cardiovascular: Normal rate, regular rhythm and normal heart sounds.   Pulmonary/Chest: Effort normal and breath sounds normal. She has no wheezes. She has no rales.  Neurological: She is alert and oriented to person, place, and time.  Psychiatric: She has a normal mood and affect. Her behavior is normal.   BP 112/68   Pulse 83   Temp 97.1 F (36.2 C) (Oral)   Ht 5\' 6"  (1.676 m)   Wt 124 lb (56.2 kg)   BMI 20.01 kg/m         Assessment & Plan:   1. Chronic obstructive pulmonary disease,  unspecified COPD type (HCC) [place]. She is stable at present time on continuous oxygen and Spiriva with rescue inhaler  2. Age-related osteoporosis without current pathological fracture Add Fosamax to her regimen of calcium and vitamin D.  Frederica KusterStephen M Rayshell Goecke MD

## 2016-10-06 ENCOUNTER — Other Ambulatory Visit: Payer: Self-pay | Admitting: Pediatrics

## 2016-10-06 DIAGNOSIS — K2100 Gastro-esophageal reflux disease with esophagitis, without bleeding: Secondary | ICD-10-CM

## 2016-11-22 ENCOUNTER — Inpatient Hospital Stay (HOSPITAL_COMMUNITY)
Admission: EM | Admit: 2016-11-22 | Discharge: 2016-12-06 | DRG: 308 | Disposition: A | Discharge status: Hospice | Payer: Medicare Other | Attending: Internal Medicine | Admitting: Internal Medicine

## 2016-11-22 ENCOUNTER — Encounter (HOSPITAL_COMMUNITY): Payer: Self-pay | Admitting: Emergency Medicine

## 2016-11-22 DIAGNOSIS — Z91199 Patient's noncompliance with other medical treatment and regimen due to unspecified reason: Secondary | ICD-10-CM

## 2016-11-22 DIAGNOSIS — D638 Anemia in other chronic diseases classified elsewhere: Secondary | ICD-10-CM | POA: Diagnosis present

## 2016-11-22 DIAGNOSIS — Z87891 Personal history of nicotine dependence: Secondary | ICD-10-CM

## 2016-11-22 DIAGNOSIS — R410 Disorientation, unspecified: Secondary | ICD-10-CM

## 2016-11-22 DIAGNOSIS — K219 Gastro-esophageal reflux disease without esophagitis: Secondary | ICD-10-CM | POA: Diagnosis present

## 2016-11-22 DIAGNOSIS — E872 Acidosis: Secondary | ICD-10-CM

## 2016-11-22 DIAGNOSIS — G9341 Metabolic encephalopathy: Secondary | ICD-10-CM | POA: Diagnosis present

## 2016-11-22 DIAGNOSIS — I4891 Unspecified atrial fibrillation: Secondary | ICD-10-CM | POA: Diagnosis present

## 2016-11-22 DIAGNOSIS — Z66 Do not resuscitate: Secondary | ICD-10-CM | POA: Diagnosis not present

## 2016-11-22 DIAGNOSIS — Z79899 Other long term (current) drug therapy: Secondary | ICD-10-CM

## 2016-11-22 DIAGNOSIS — R0602 Shortness of breath: Secondary | ICD-10-CM

## 2016-11-22 DIAGNOSIS — Z9981 Dependence on supplemental oxygen: Secondary | ICD-10-CM

## 2016-11-22 DIAGNOSIS — E46 Unspecified protein-calorie malnutrition: Secondary | ICD-10-CM | POA: Diagnosis present

## 2016-11-22 DIAGNOSIS — J441 Chronic obstructive pulmonary disease with (acute) exacerbation: Secondary | ICD-10-CM

## 2016-11-22 DIAGNOSIS — I959 Hypotension, unspecified: Secondary | ICD-10-CM

## 2016-11-22 DIAGNOSIS — I248 Other forms of acute ischemic heart disease: Secondary | ICD-10-CM | POA: Diagnosis present

## 2016-11-22 DIAGNOSIS — J449 Chronic obstructive pulmonary disease, unspecified: Secondary | ICD-10-CM | POA: Diagnosis present

## 2016-11-22 DIAGNOSIS — E785 Hyperlipidemia, unspecified: Secondary | ICD-10-CM | POA: Diagnosis present

## 2016-11-22 DIAGNOSIS — I7300 Raynaud's syndrome without gangrene: Secondary | ICD-10-CM | POA: Diagnosis present

## 2016-11-22 DIAGNOSIS — E876 Hypokalemia: Secondary | ICD-10-CM | POA: Diagnosis not present

## 2016-11-22 DIAGNOSIS — I5033 Acute on chronic diastolic (congestive) heart failure: Secondary | ICD-10-CM | POA: Diagnosis present

## 2016-11-22 DIAGNOSIS — J439 Emphysema, unspecified: Secondary | ICD-10-CM

## 2016-11-22 DIAGNOSIS — J9622 Acute and chronic respiratory failure with hypercapnia: Secondary | ICD-10-CM

## 2016-11-22 DIAGNOSIS — I482 Chronic atrial fibrillation: Secondary | ICD-10-CM | POA: Diagnosis not present

## 2016-11-22 DIAGNOSIS — J90 Pleural effusion, not elsewhere classified: Secondary | ICD-10-CM

## 2016-11-22 DIAGNOSIS — M810 Age-related osteoporosis without current pathological fracture: Secondary | ICD-10-CM | POA: Diagnosis present

## 2016-11-22 DIAGNOSIS — Y95 Nosocomial condition: Secondary | ICD-10-CM | POA: Diagnosis present

## 2016-11-22 DIAGNOSIS — F419 Anxiety disorder, unspecified: Secondary | ICD-10-CM | POA: Diagnosis present

## 2016-11-22 DIAGNOSIS — I2602 Saddle embolus of pulmonary artery with acute cor pulmonale: Secondary | ICD-10-CM

## 2016-11-22 DIAGNOSIS — F324 Major depressive disorder, single episode, in partial remission: Secondary | ICD-10-CM

## 2016-11-22 DIAGNOSIS — J9601 Acute respiratory failure with hypoxia: Secondary | ICD-10-CM

## 2016-11-22 DIAGNOSIS — H269 Unspecified cataract: Secondary | ICD-10-CM | POA: Diagnosis present

## 2016-11-22 DIAGNOSIS — R531 Weakness: Secondary | ICD-10-CM | POA: Diagnosis not present

## 2016-11-22 DIAGNOSIS — J9621 Acute and chronic respiratory failure with hypoxia: Secondary | ICD-10-CM | POA: Diagnosis present

## 2016-11-22 DIAGNOSIS — E871 Hypo-osmolality and hyponatremia: Secondary | ICD-10-CM

## 2016-11-22 DIAGNOSIS — J9690 Respiratory failure, unspecified, unspecified whether with hypoxia or hypercapnia: Secondary | ICD-10-CM

## 2016-11-22 DIAGNOSIS — J9620 Acute and chronic respiratory failure, unspecified whether with hypoxia or hypercapnia: Secondary | ICD-10-CM

## 2016-11-22 DIAGNOSIS — Z86711 Personal history of pulmonary embolism: Secondary | ICD-10-CM

## 2016-11-22 DIAGNOSIS — J440 Chronic obstructive pulmonary disease with acute lower respiratory infection: Secondary | ICD-10-CM | POA: Diagnosis present

## 2016-11-22 DIAGNOSIS — F329 Major depressive disorder, single episode, unspecified: Secondary | ICD-10-CM | POA: Diagnosis present

## 2016-11-22 DIAGNOSIS — R627 Adult failure to thrive: Secondary | ICD-10-CM | POA: Diagnosis present

## 2016-11-22 DIAGNOSIS — R0902 Hypoxemia: Secondary | ICD-10-CM

## 2016-11-22 DIAGNOSIS — J189 Pneumonia, unspecified organism: Secondary | ICD-10-CM | POA: Diagnosis present

## 2016-11-22 DIAGNOSIS — Z7189 Other specified counseling: Secondary | ICD-10-CM

## 2016-11-22 DIAGNOSIS — I272 Pulmonary hypertension, unspecified: Secondary | ICD-10-CM

## 2016-11-22 DIAGNOSIS — D696 Thrombocytopenia, unspecified: Secondary | ICD-10-CM | POA: Diagnosis present

## 2016-11-22 DIAGNOSIS — Z515 Encounter for palliative care: Secondary | ICD-10-CM

## 2016-11-22 DIAGNOSIS — E8729 Other acidosis: Secondary | ICD-10-CM

## 2016-11-22 DIAGNOSIS — R251 Tremor, unspecified: Secondary | ICD-10-CM

## 2016-11-22 DIAGNOSIS — Z6821 Body mass index (BMI) 21.0-21.9, adult: Secondary | ICD-10-CM

## 2016-11-22 DIAGNOSIS — Z7901 Long term (current) use of anticoagulants: Secondary | ICD-10-CM

## 2016-11-22 DIAGNOSIS — Z9119 Patient's noncompliance with other medical treatment and regimen: Secondary | ICD-10-CM

## 2016-11-22 HISTORY — DX: Unspecified atrial fibrillation: I48.91

## 2016-11-22 HISTORY — DX: Adult failure to thrive: R62.7

## 2016-11-22 NOTE — ED Triage Notes (Signed)
Per EMS, pt was [facility] a couple weeks ago and DXed with afib and failure to thrive, was sent to [facility] for rehab and was suppose to be transferred to [place] term care today.  Pt signed self out of [facility] Saturday, still weak and unable to take care of self.  Sent in by son to be re-evaluated for long term care

## 2016-11-22 NOTE — ED Provider Notes (Signed)
[facility]-EMERGENCY DEPT Provider Note   CSN: [id number] Arrival date & time: 11/22/16  2326 By signing my name below, I, Bridgette Habermann, attest that this documentation has been prepared under the direction and in the presence of Devoria Albe, MD. Electronically Signed: Bridgette Habermann, ED Scribe. 11/23/16. 12:36 AM.  Time seen 12:25 AM  History   Chief Complaint Chief Complaint  Patient presents with  . Weakness   HPI Comments: Shannon Jackson is a 70 y.o. female with h/o A-fib on Xarelto, COPD, GERD, and PE, who presents to the Emergency Department by EMS complaining of generalized weakness onset a couple weeks ago, about the week before Thanksgiving. Pt also has some mild nausea that began tonight. Pt notes that she is also short of breath which is baseline for her. EMS states that pt was sent by her son to be re-evaluated for long-term care. Per son at bedside, pt was seen at [facility] a couple weeks ago and diagnosed with A-fib and failure to thrive. She was admitted to the ICU and had several episodes of the atrial fibrillation.  Pt states she has not been able to ambulate since admitted for A-fib. She was sent to [facility] after being discharged for rehabilitation and was supposed to be sent to [facility], [place] for long-term care yesterday. Pt signed herself out of [facility] on 11/21/16 even though she is still weak. Pt reports that they were not treating her well over at the facility and prescribed her medications that caused her to hallucinate. Son states family has been staying with her 24/7 since she left [facility] and she isn't improving.  Pt is regularly on 2L oxygen at home for her COPD. Prior to her A-fib diagnosis, she lived at home by herself. Pt denies fever, chills, chest pain, diaphoresis, vomiting, or any other associated symptoms.She started getting swelling in her legs while in the hospital.  PCP: [facility] in [place]   The history is provided by the patient, the  EMS personnel and a relative. No language interpreter was used.    Past Medical History:  Diagnosis Date  . A-fib (HCC)   . Allergic rhinitis   . Arthritis    left hip  . Cataract   . COPD (chronic obstructive pulmonary disease) (HCC)   . Failure to thrive in adult   . GERD (gastroesophageal reflux disease)   . Hyperlipidemia   . Osteopenia   . Osteoporosis 08/2016  . Pulmonary embolism (HCC)   . Raynaud's syndrome     Patient Active Problem List   Diagnosis Date Noted  . Atrial fibrillation with rapid ventricular response (HCC) 11/23/2016  . Osteoporosis 09/22/2016  . Allergic rhinitis   . Arthritis   . Osteopenia   . Hyperlipidemia   . Raynaud's disease 06/28/2013  . COPD (chronic obstructive pulmonary disease) (HCC) 11/10/2011    Past Surgical History:  Procedure Laterality Date  . FOOT NEUROMA SURGERY    . TUBAL LIGATION      OB History    No data available       Home Medications    Prior to Admission medications   Medication Sig Start Date End Date Taking? Authorizing Provider  albuterol (PROVENTIL HFA;VENTOLIN HFA) 108 (90 Base) MCG/ACT inhaler Inhale 2 puffs into the lungs every 6 (six) hours as needed. 09/22/16   Frederica Kuster, MD  alendronate (FOSAMAX) 70 MG tablet Take 1 tablet (70 mg total) by mouth every 7 (seven) days. Take with a full glass  of water on an empty stomach. 09/16/16   Frederica KusterStephen M Miller, MD  azithromycin (ZITHROMAX) 250 MG tablet Take 1 tablet by mouth every Monday, Wednesday, and Friday. 06/21/16   Historical Provider, MD  clonazePAM (KLONOPIN) 0.5 MG tablet Take 0.5 mg by mouth 2 (two) times daily.  11/21/14   Historical Provider, MD  Nutritional Supplements (ENSURE HIGH PROTEIN PO) Take by mouth daily.    Historical Provider, MD  pantoprazole (PROTONIX) 40 MG tablet TAKE 1 TABLET BY MOUTH EVERY DAY 10/07/16   Frederica KusterStephen M Miller, MD  predniSONE (DELTASONE) 10 MG tablet Take 10 mg by mouth every other day.  05/05/13   Historical Provider,  MD  Pseudoephedrine-Guaifenesin (MUCINEX D) [phone] MG TB12 Take 1 tablet by mouth 2 (two) times daily as needed.     Historical Provider, MD  sertraline (ZOLOFT) 100 MG tablet Take 1 tablet by mouth daily. 07/23/16   Historical Provider, MD  SPIRIVA RESPIMAT 2.5 MCG/ACT AERS Take 2 puffs by mouth daily. 07/23/16   Historical Provider, MD  SYMBICORT 160-4.5 MCG/ACT inhaler INHALE 2 PUFFS INTO THE LUNGS TWICE DAILY 01/07/16   Johna Sheriffarol L Vincent, MD  Vitamin D, Cholecalciferol, 1000 units CAPS Take 1,000 Units by mouth daily.    Historical Provider, MD  XARELTO 20 MG TABS tablet Take 20 mg by mouth daily. 02/24/15   Historical Provider, MD    Family History Family History  Problem Relation Age of Onset  . Melanoma Mother   . Emphysema Father   . Arthritis Father   . Cancer Brother     skin, melanoma    Social History Social History  Substance Use Topics  . Smoking status: Former Smoker    Packs/day: 1.00    Years: 25.00    Types: Cigarettes    Quit date: 12/06/2006  . Smokeless tobacco: Never Used  . Alcohol use No  oxygen 2 lpm [place]   Allergies   Codeine; Levofloxacin; and Lovenox [enoxaparin sodium]   Review of Systems Review of Systems  Constitutional: Negative for chills and fever.  Respiratory: Positive for shortness of breath.   Gastrointestinal: Positive for abdominal pain and nausea. Negative for vomiting.  Neurological: Positive for weakness.  All other systems reviewed and are negative.    Physical Exam Updated Vital Signs BP (!) 106/48 (BP Location: Left Arm)   Pulse 88   Temp 97.8 F (36.6 C) (Oral)   Resp 16   Ht 5\' 6"  (1.676 m)   Wt 111 lb (50.3 kg)   SpO2 97%   BMI 17.92 kg/m   Vital signs normal except tachycardia   Physical Exam  Constitutional: She is oriented to person, place, and time.  Non-toxic appearance. She does not appear ill. She appears distressed.  Thin, frail, elderly female.  HENT:  Head: Normocephalic and atraumatic.  Right Ear:  External ear normal.  Left Ear: External ear normal.  Nose: Nose normal. No mucosal edema or rhinorrhea.  Mouth/Throat: Oropharynx is clear and moist. No dental abscesses or uvula swelling.  Eyes:  Pupils are dilated, minimally reactive to light. Appears to have bilateral cataracts.  Neck: Normal range of motion and full passive range of motion without pain. Neck supple.  Cardiovascular: Normal heart sounds.  An irregularly irregular rhythm present. Tachycardia present.  Exam reveals no gallop and no friction rub.   No murmur heard. Pulmonary/Chest: Accessory muscle usage present. Tachypnea noted. She is in respiratory distress. She has decreased breath sounds. She has no wheezes. She has no rales.  She exhibits no tenderness.  Abdominal: Soft. Bowel sounds are normal. She exhibits no distension and no mass. There is no tenderness. There is no guarding.  Musculoskeletal: Normal range of motion. She exhibits edema. She exhibits no tenderness.  Moves all extremities well. 1+ edema of BLEs.  Neurological: She is alert and oriented to person, place, and time. No cranial nerve deficit.  Skin: Skin is warm and dry. No rash noted. No erythema. No pallor.  Psychiatric: She has a normal mood and affect. Her behavior is normal. Her mood appears not anxious.  Nursing note and vitals reviewed.   ED Treatments / Results  DIAGNOSTIC STUDIES: Oxygen Saturation is 97% on [place] 3L, adequate by my interpretation.     Labs (all labs ordered are listed, but only abnormal results are displayed) Results for orders placed or performed during the hospital encounter of 11/22/16  Comprehensive metabolic panel  Result Value Ref Range   Sodium 130 (L) 135 - 145 mmol/L   Potassium 4.1 3.5 - 5.1 mmol/L   Chloride 85 (L) 101 - 111 mmol/L   CO2 40 (H) 22 - 32 mmol/L   Glucose, Bld 104 (H) 65 - 99 mg/dL   BUN 17 6 - 20 mg/dL   Creatinine, Ser 5.78 (L) 0.44 - 1.00 mg/dL   Calcium 7.8 (L) 8.9 - 10.3 mg/dL   Total  Protein 5.2 (L) 6.5 - 8.1 g/dL   Albumin 3.2 (L) 3.5 - 5.0 g/dL   AST 26 15 - 41 U/L   ALT 58 (H) 14 - 54 U/L   Alkaline Phosphatase 56 38 - 126 U/L   Total Bilirubin 1.1 0.3 - 1.2 mg/dL   GFR calc non Af Amer >60 >60 mL/min   GFR calc Af Amer >60 >60 mL/min   Anion gap 5 5 - 15  Troponin I  Result Value Ref Range   Troponin I 0.03 (HH) <0.03 ng/mL  Brain natriuretic peptide  Result Value Ref Range   B Natriuretic Peptide 218.0 (H) 0.0 - 100.0 pg/mL  CBC with Differential  Result Value Ref Range   WBC 18.7 (H) 4.0 - 10.5 K/uL   RBC 3.75 (L) 3.87 - 5.11 MIL/uL   Hemoglobin 11.9 (L) 12.0 - 15.0 g/dL   HCT 46.9 62.9 - 52.8 %   MCV 97.3 78.0 - 100.0 fL   MCH 31.7 26.0 - 34.0 pg   MCHC 32.6 30.0 - 36.0 g/dL   RDW 41.3 24.4 - 01.0 %   Platelets 139 (L) 150 - 400 K/uL   Neutrophils Relative % 92 %   Neutro Abs 17.1 (H) 1.7 - 7.7 K/uL   Lymphocytes Relative 5 %   Lymphs Abs 1.0 0.7 - 4.0 K/uL   Monocytes Relative 3 %   Monocytes Absolute 0.6 0.1 - 1.0 K/uL   Eosinophils Relative 0 %   Eosinophils Absolute 0.1 0.0 - 0.7 K/uL   Basophils Relative 0 %   Basophils Absolute 0.0 0.0 - 0.1 K/uL  Urinalysis, Routine w reflex microscopic  Result Value Ref Range   Color, Urine YELLOW YELLOW   APPearance HAZY (A) CLEAR   Specific Gravity, Urine 1.016 1.005 - 1.030   pH 8.0 5.0 - 8.0   Glucose, UA NEGATIVE NEGATIVE mg/dL   Hgb urine dipstick NEGATIVE NEGATIVE   Bilirubin Urine NEGATIVE NEGATIVE   Ketones, ur NEGATIVE NEGATIVE mg/dL   Protein, ur 30 (A) NEGATIVE mg/dL   Nitrite NEGATIVE NEGATIVE   Leukocytes, UA NEGATIVE NEGATIVE   RBC /  HPF 6-30 0 - 5 RBC/hpf   WBC, UA 0-5 0 - 5 WBC/hpf   Bacteria, UA RARE (A) NONE SEEN   Mucous PRESENT    Laboratory interpretation all normal except Leukocytosis, mild anemia, hyponatremia, low chloride, low protein consistent with malnutrition, mildly positive troponin which is expected with the tachycardia, mildly elevated BNP    EKG  EKG  Interpretation  Date/Time:  Tuesday November 23 2016 00:58:50 EST Ventricular Rate:  169 PR Interval:    QRS Duration: 77 QT Interval:  284 QTC Calculation: 479 R Axis:   -86 Text Interpretation:  Atrial fibrillation with rapid V-rate Ventricular premature complex Left anterior fascicular block Consider anterior infarct ST depression, probably rate related Artifact in lead(s) I II III aVR aVL aVF V1 V2 V3 V4 Since last tracing 03 Nov 2000 Atrial fibrillation has replaced Normal sinus rhythm Confirmed by Hermina Barnard  MD-I, Victorio Creeden ([id number]) on 11/23/2016 3:45:12 AM       Radiology Dg Chest Portable 1 View  Result Date: 11/23/2016 CLINICAL DATA:  Initial evaluation for acute chest pain, weakness. EXAM: PORTABLE CHEST 1 VIEW COMPARISON:  Prior radiograph from 11/03/2016. FINDINGS: Transverse heart size within normal limits. Mediastinal silhouette normal. Aortic atherosclerosis noted. Lungs mildly hypoinflated. Changes related to emphysema noted. There is hazy density overlying the left lung base, which could reflect atelectasis and/or overlying soft tissue attenuation. Possible infiltrate not excluded. No other focal infiltrates. No pulmonary edema or pleural effusion. No pneumothorax. No acute osseous abnormality. IMPRESSION: 1. Hazy opacity overlying the peripheral left lung base. While this finding may be related to atelectasis and/or overlying soft tissue attenuation, possible infectious infiltrate could be considered in the correct clinical setting. 2. Emphysema. 3. Aortic atherosclerosis. Electronically Signed   By: Rise MuBenjamin  McClintock M.D.   On: 11/23/2016 01:41    Procedures Procedures (including critical care time)  Medications Ordered in ED Medications  dextrose 5 % with diltiazem (CARDIZEM) ADS Med (  Not Given 11/23/16 0111)  digoxin (LANOXIN) 0.25 MG/ML injection 0.125 mg (not administered)  amiodarone (NEXTERONE PREMIX) 360-4.14 MG/200ML-% (1.8 mg/mL) IV infusion (not administered)     Followed by  amiodarone (NEXTERONE PREMIX) 360-4.14 MG/200ML-% (1.8 mg/mL) IV infusion (not administered)  ondansetron (ZOFRAN) injection 4 mg (4 mg Intravenous Given 11/23/16 0057)  0.9 %  sodium chloride infusion ( Intravenous Stopped 11/23/16 0230)  0.9 %  sodium chloride infusion (0 mLs Intravenous Stopped 11/23/16 0333)  digoxin (LANOXIN) 0.25 MG/ML injection 0.125 mg (0.125 mg Intravenous Given 11/23/16 0316)  sodium chloride 0.9 % bolus 500 mL (500 mLs Intravenous New Bag/Given 11/23/16 0354)  amiodarone (CORDARONE) injection 150 mg (150 mg Intravenous Given 11/23/16 0354)     Initial Impression / Assessment and Plan / ED Course  I have reviewed the triage vital signs and the nursing notes.  Pertinent labs & imaging results that were available during my care of the patient were reviewed by me and considered in my medical decision making (see chart for details).  Clinical Course    COORDINATION OF CARE: 12:33 AM Discussed treatment plan with pt at bedside which includes IV fluids, lab work and x-ray and pt agreed to plan.  Patient became hypotensive shortly after arrival in the ED. She was given IV fluid boluses and her blood pressure remained in the 80s. Monitor and was in atrial fibrillation with fast ventricular response. Patient was asymptomatic from her hypotension, cardioversion was considered.  Dr Gae GallopQuereshi, cardiology 03:08 AM we discussed possible cardioversion however he states we could  try digoxin and amiodarone since she is tolerating the hypotension well. He states the digoxin can be repeated in 6 hours for a total of 2 doses. The amiodarone should be a 150 mg bolus over 10 minutes and then 1 mg per KG over 6 hours then 0.5 mg per KG over 18 hours. States if her BP get lower than 80 systolic consider cardioversion.   Patient was given digoxin IV and had a brief episode of her blood pressure being in the low 100s systolic. Nurses report her heart rate did slow down into the  90s however it went back up to the 150 and 160 range.  04:04 AM patient is still getting her amiodarone bolus. Her blood pressure briefly dropped to 53 systolic however it did come back up to 86 systolic. Patient denies chest pain, worsening of her shortness of breath, nausea, or feeling lightheaded or dizzy. I'm going to talk to the hospitalist now.  This patients CHA2DS2-VASc Score and unadjusted Ischemic Stroke Rate (% per year) is equal to 2.2 % stroke rate/year from a score of 2  Above score calculated as 1 point each if present [CHF, HTN, DM, Vascular=MI/PAD/Aortic Plaque, Age if 65-74, or Female] Above score calculated as 2 points each if present [Age > 75, or Stroke/TIA/TE]   04:10 AM pt resting, HR 137, BP 101/75  04:34 AM Dr Sharl Ma, admit to step down  04:34 AM BP 115/69, HR 138-146  Patient verifies she has been taking her xarelto.    Final Clinical Impressions(s) / ED Diagnoses   Final diagnoses:  Atrial fibrillation with rapid ventricular response (HCC)  Hypotension, unspecified hypotension type    Plan admission  Devoria Albe, MD, FACEP   CRITICAL CARE Performed by: Bonny Vanleeuwen L Samin Milke Total critical care time: 42 minutes Critical care time was exclusive of separately billable procedures and treating other patients. Critical care was necessary to treat or prevent imminent or life-threatening deterioration. Critical care was time spent personally by me on the following activities: development of treatment plan with patient and/or surrogate as well as nursing, discussions with consultants, evaluation of patient's response to treatment, examination of patient, obtaining history from patient or surrogate, ordering and performing treatments and interventions, ordering and review of laboratory studies, ordering and review of radiographic studies, pulse oximetry and re-evaluation of patient's condition.  Devoria Albe, MD, FACEP     I personally performed the services described in  this documentation, which was scribed in my presence. The recorded information has been reviewed and considered.  Devoria Albe, MD, Concha Pyo, MD 11/23/16 [phone]

## 2016-11-23 ENCOUNTER — Emergency Department (HOSPITAL_COMMUNITY): Payer: Medicare Other

## 2016-11-23 ENCOUNTER — Encounter (HOSPITAL_COMMUNITY): Payer: Self-pay

## 2016-11-23 DIAGNOSIS — F329 Major depressive disorder, single episode, unspecified: Secondary | ICD-10-CM | POA: Diagnosis present

## 2016-11-23 DIAGNOSIS — Z8489 Family history of other specified conditions: Secondary | ICD-10-CM | POA: Diagnosis not present

## 2016-11-23 DIAGNOSIS — R0600 Dyspnea, unspecified: Secondary | ICD-10-CM | POA: Diagnosis not present

## 2016-11-23 DIAGNOSIS — Z8261 Family history of arthritis: Secondary | ICD-10-CM | POA: Diagnosis not present

## 2016-11-23 DIAGNOSIS — R531 Weakness: Secondary | ICD-10-CM | POA: Diagnosis present

## 2016-11-23 DIAGNOSIS — E785 Hyperlipidemia, unspecified: Secondary | ICD-10-CM | POA: Diagnosis present

## 2016-11-23 DIAGNOSIS — R627 Adult failure to thrive: Secondary | ICD-10-CM | POA: Diagnosis present

## 2016-11-23 DIAGNOSIS — E871 Hypo-osmolality and hyponatremia: Secondary | ICD-10-CM | POA: Diagnosis present

## 2016-11-23 DIAGNOSIS — I2602 Saddle embolus of pulmonary artery with acute cor pulmonale: Secondary | ICD-10-CM | POA: Diagnosis present

## 2016-11-23 DIAGNOSIS — Z7901 Long term (current) use of anticoagulants: Secondary | ICD-10-CM | POA: Diagnosis not present

## 2016-11-23 DIAGNOSIS — R0902 Hypoxemia: Secondary | ICD-10-CM | POA: Diagnosis not present

## 2016-11-23 DIAGNOSIS — J189 Pneumonia, unspecified organism: Secondary | ICD-10-CM

## 2016-11-23 DIAGNOSIS — E46 Unspecified protein-calorie malnutrition: Secondary | ICD-10-CM | POA: Diagnosis present

## 2016-11-23 DIAGNOSIS — Z515 Encounter for palliative care: Secondary | ICD-10-CM | POA: Diagnosis not present

## 2016-11-23 DIAGNOSIS — J440 Chronic obstructive pulmonary disease with acute lower respiratory infection: Secondary | ICD-10-CM | POA: Diagnosis present

## 2016-11-23 DIAGNOSIS — I5033 Acute on chronic diastolic (congestive) heart failure: Secondary | ICD-10-CM | POA: Diagnosis present

## 2016-11-23 DIAGNOSIS — I4891 Unspecified atrial fibrillation: Secondary | ICD-10-CM | POA: Diagnosis not present

## 2016-11-23 DIAGNOSIS — I482 Chronic atrial fibrillation: Secondary | ICD-10-CM | POA: Diagnosis present

## 2016-11-23 DIAGNOSIS — J9620 Acute and chronic respiratory failure, unspecified whether with hypoxia or hypercapnia: Secondary | ICD-10-CM | POA: Diagnosis not present

## 2016-11-23 DIAGNOSIS — J90 Pleural effusion, not elsewhere classified: Secondary | ICD-10-CM | POA: Diagnosis not present

## 2016-11-23 DIAGNOSIS — J449 Chronic obstructive pulmonary disease, unspecified: Secondary | ICD-10-CM | POA: Diagnosis not present

## 2016-11-23 DIAGNOSIS — I7300 Raynaud's syndrome without gangrene: Secondary | ICD-10-CM | POA: Diagnosis present

## 2016-11-23 DIAGNOSIS — G9341 Metabolic encephalopathy: Secondary | ICD-10-CM | POA: Diagnosis present

## 2016-11-23 DIAGNOSIS — D696 Thrombocytopenia, unspecified: Secondary | ICD-10-CM | POA: Diagnosis present

## 2016-11-23 DIAGNOSIS — J9601 Acute respiratory failure with hypoxia: Secondary | ICD-10-CM | POA: Diagnosis not present

## 2016-11-23 DIAGNOSIS — J418 Mixed simple and mucopurulent chronic bronchitis: Secondary | ICD-10-CM | POA: Diagnosis not present

## 2016-11-23 DIAGNOSIS — I959 Hypotension, unspecified: Secondary | ICD-10-CM | POA: Diagnosis not present

## 2016-11-23 DIAGNOSIS — F324 Major depressive disorder, single episode, in partial remission: Secondary | ICD-10-CM | POA: Diagnosis not present

## 2016-11-23 DIAGNOSIS — J9622 Acute and chronic respiratory failure with hypercapnia: Secondary | ICD-10-CM | POA: Diagnosis present

## 2016-11-23 DIAGNOSIS — E872 Acidosis: Secondary | ICD-10-CM | POA: Diagnosis present

## 2016-11-23 DIAGNOSIS — J439 Emphysema, unspecified: Secondary | ICD-10-CM | POA: Diagnosis not present

## 2016-11-23 DIAGNOSIS — R251 Tremor, unspecified: Secondary | ICD-10-CM | POA: Diagnosis not present

## 2016-11-23 DIAGNOSIS — J432 Centrilobular emphysema: Secondary | ICD-10-CM | POA: Diagnosis not present

## 2016-11-23 DIAGNOSIS — Z86711 Personal history of pulmonary embolism: Secondary | ICD-10-CM | POA: Diagnosis not present

## 2016-11-23 DIAGNOSIS — I272 Pulmonary hypertension, unspecified: Secondary | ICD-10-CM | POA: Diagnosis present

## 2016-11-23 DIAGNOSIS — R410 Disorientation, unspecified: Secondary | ICD-10-CM | POA: Diagnosis not present

## 2016-11-23 DIAGNOSIS — Z7189 Other specified counseling: Secondary | ICD-10-CM | POA: Diagnosis not present

## 2016-11-23 DIAGNOSIS — J9621 Acute and chronic respiratory failure with hypoxia: Secondary | ICD-10-CM | POA: Diagnosis present

## 2016-11-23 DIAGNOSIS — I248 Other forms of acute ischemic heart disease: Secondary | ICD-10-CM | POA: Diagnosis present

## 2016-11-23 DIAGNOSIS — Z9119 Patient's noncompliance with other medical treatment and regimen: Secondary | ICD-10-CM | POA: Diagnosis not present

## 2016-11-23 DIAGNOSIS — J441 Chronic obstructive pulmonary disease with (acute) exacerbation: Secondary | ICD-10-CM | POA: Diagnosis not present

## 2016-11-23 DIAGNOSIS — Y95 Nosocomial condition: Secondary | ICD-10-CM | POA: Diagnosis present

## 2016-11-23 DIAGNOSIS — K219 Gastro-esophageal reflux disease without esophagitis: Secondary | ICD-10-CM | POA: Diagnosis present

## 2016-11-23 LAB — URINALYSIS, ROUTINE W REFLEX MICROSCOPIC
Bilirubin Urine: NEGATIVE
Glucose, UA: NEGATIVE mg/dL
Hgb urine dipstick: NEGATIVE
Ketones, ur: NEGATIVE mg/dL
Leukocytes, UA: NEGATIVE
Nitrite: NEGATIVE
PROTEIN: 30 mg/dL — AB
Specific Gravity, Urine: 1.016 (ref 1.005–1.030)
pH: 8 (ref 5.0–8.0)

## 2016-11-23 LAB — CBC WITH DIFFERENTIAL/PLATELET
Basophils Absolute: 0 10*3/uL (ref 0.0–0.1)
Basophils Relative: 0 %
Eosinophils Absolute: 0.1 10*3/uL (ref 0.0–0.7)
Eosinophils Relative: 0 %
HCT: 36.5 % (ref 36.0–46.0)
HEMOGLOBIN: 11.9 g/dL — AB (ref 12.0–15.0)
LYMPHS ABS: 1 10*3/uL (ref 0.7–4.0)
LYMPHS PCT: 5 %
MCH: 31.7 pg (ref 26.0–34.0)
MCHC: 32.6 g/dL (ref 30.0–36.0)
MCV: 97.3 fL (ref 78.0–100.0)
MONOS PCT: 3 %
Monocytes Absolute: 0.6 10*3/uL (ref 0.1–1.0)
NEUTROS PCT: 92 %
Neutro Abs: 17.1 10*3/uL — ABNORMAL HIGH (ref 1.7–7.7)
Platelets: 139 10*3/uL — ABNORMAL LOW (ref 150–400)
RBC: 3.75 MIL/uL — AB (ref 3.87–5.11)
RDW: 12.5 % (ref 11.5–15.5)
WBC: 18.7 10*3/uL — AB (ref 4.0–10.5)

## 2016-11-23 LAB — BASIC METABOLIC PANEL
ANION GAP: 3 — AB (ref 5–15)
BUN: 16 mg/dL (ref 6–20)
CHLORIDE: 93 mmol/L — AB (ref 101–111)
CO2: 39 mmol/L — AB (ref 22–32)
Calcium: 7.2 mg/dL — ABNORMAL LOW (ref 8.9–10.3)
Creatinine, Ser: 0.33 mg/dL — ABNORMAL LOW (ref 0.44–1.00)
GFR calc non Af Amer: >60 mL/min (ref >60)
Glucose, Bld: 106 mg/dL — ABNORMAL HIGH (ref 65–99)
Potassium: 3.8 mmol/L (ref 3.5–5.1)
Sodium: 135 mmol/L (ref 135–145)

## 2016-11-23 LAB — COMPREHENSIVE METABOLIC PANEL
ALBUMIN: 3.2 g/dL — AB (ref 3.5–5.0)
ALK PHOS: 56 U/L (ref 38–126)
ALT: 58 U/L — ABNORMAL HIGH (ref 14–54)
ANION GAP: 5 (ref 5–15)
AST: 26 U/L (ref 15–41)
BILIRUBIN TOTAL: 1.1 mg/dL (ref 0.3–1.2)
BUN: 17 mg/dL (ref 6–20)
CALCIUM: 7.8 mg/dL — AB (ref 8.9–10.3)
CO2: 40 mmol/L — AB (ref 22–32)
Chloride: 85 mmol/L — ABNORMAL LOW (ref 101–111)
Creatinine, Ser: 0.32 mg/dL — ABNORMAL LOW (ref 0.44–1.00)
GFR calc Af Amer: >60 mL/min (ref >60)
GFR calc non Af Amer: >60 mL/min (ref >60)
Glucose, Bld: 104 mg/dL — ABNORMAL HIGH (ref 65–99)
POTASSIUM: 4.1 mmol/L (ref 3.5–5.1)
Sodium: 130 mmol/L — ABNORMAL LOW (ref 135–145)
Total Protein: 5.2 g/dL — ABNORMAL LOW (ref 6.5–8.1)

## 2016-11-23 LAB — CBC
HEMATOCRIT: 34.4 % — AB (ref 36.0–46.0)
HEMOGLOBIN: 10.8 g/dL — AB (ref 12.0–15.0)
MCH: 31 pg (ref 26.0–34.0)
MCHC: 31.4 g/dL (ref 30.0–36.0)
MCV: 98.9 fL (ref 78.0–100.0)
Platelets: 122 10*3/uL — ABNORMAL LOW (ref 150–400)
RBC: 3.48 MIL/uL — ABNORMAL LOW (ref 3.87–5.11)
RDW: 12 % (ref 11.5–15.5)
WBC: 16 10*3/uL — AB (ref 4.0–10.5)

## 2016-11-23 LAB — TROPONIN I: Troponin I: 0.03 ng/mL (ref <0.03)

## 2016-11-23 LAB — BRAIN NATRIURETIC PEPTIDE: B Natriuretic Peptide: 218 pg/mL — ABNORMAL HIGH (ref 0.0–100.0)

## 2016-11-23 LAB — MRSA PCR SCREENING: MRSA BY PCR: NEGATIVE

## 2016-11-23 MED ORDER — DEXTROSE 5 % IV SOLN
INTRAVENOUS | Status: AC
  Filled 2016-11-23: qty 100

## 2016-11-23 MED ORDER — RIVAROXABAN 20 MG PO TABS
20.0000 mg | ORAL_TABLET | Freq: Every day | ORAL | Status: DC
  Administered 2016-11-23 – 2016-11-30 (×6): 20 mg via ORAL
  Filled 2016-11-23 (×8): qty 1

## 2016-11-23 MED ORDER — SODIUM CHLORIDE 0.9 % IV SOLN
Freq: Once | INTRAVENOUS | Status: AC
  Administered 2016-11-23: 01:00:00 via INTRAVENOUS

## 2016-11-23 MED ORDER — ONDANSETRON HCL 4 MG/2ML IJ SOLN
4.0000 mg | Freq: Once | INTRAMUSCULAR | Status: AC
  Administered 2016-11-23: 4 mg via INTRAVENOUS

## 2016-11-23 MED ORDER — VANCOMYCIN HCL 500 MG IV SOLR
500.0000 mg | Freq: Two times a day (BID) | INTRAVENOUS | Status: DC
  Administered 2016-11-23 – 2016-11-25 (×4): 500 mg via INTRAVENOUS
  Filled 2016-11-23 (×6): qty 500

## 2016-11-23 MED ORDER — HYDROCORTISONE NA SUCCINATE PF 100 MG IJ SOLR
50.0000 mg | Freq: Two times a day (BID) | INTRAMUSCULAR | Status: DC
  Administered 2016-11-23 – 2016-11-26 (×8): 50 mg via INTRAVENOUS
  Filled 2016-11-23 (×8): qty 2

## 2016-11-23 MED ORDER — PANTOPRAZOLE SODIUM 40 MG PO TBEC
40.0000 mg | DELAYED_RELEASE_TABLET | Freq: Every day | ORAL | Status: DC
  Administered 2016-11-23 – 2016-11-30 (×7): 40 mg via ORAL
  Filled 2016-11-23 (×8): qty 1

## 2016-11-23 MED ORDER — SODIUM CHLORIDE 0.9 % IV BOLUS (SEPSIS)
500.0000 mL | Freq: Once | INTRAVENOUS | Status: AC
  Administered 2016-11-23: 500 mL via INTRAVENOUS

## 2016-11-23 MED ORDER — AMIODARONE HCL 150 MG/3ML IV SOLN
150.0000 mg | Freq: Once | INTRAVENOUS | Status: AC
  Administered 2016-11-23: 150 mg via INTRAVENOUS
  Filled 2016-11-23: qty 3

## 2016-11-23 MED ORDER — VANCOMYCIN HCL IN DEXTROSE 1-5 GM/200ML-% IV SOLN
1000.0000 mg | Freq: Once | INTRAVENOUS | Status: AC
  Administered 2016-11-23: 1000 mg via INTRAVENOUS
  Filled 2016-11-23: qty 200

## 2016-11-23 MED ORDER — SODIUM CHLORIDE 0.45 % IV BOLUS
500.0000 mL | Freq: Once | INTRAVENOUS | Status: AC
  Administered 2016-11-23: 500 mL via INTRAVENOUS

## 2016-11-23 MED ORDER — CLONAZEPAM 0.5 MG PO TABS
0.5000 mg | ORAL_TABLET | Freq: Two times a day (BID) | ORAL | Status: DC
  Administered 2016-11-23 – 2016-11-25 (×5): 0.5 mg via ORAL
  Filled 2016-11-23 (×5): qty 1

## 2016-11-23 MED ORDER — SERTRALINE HCL 100 MG PO TABS
100.0000 mg | ORAL_TABLET | Freq: Every day | ORAL | Status: DC
  Administered 2016-11-23 – 2016-11-28 (×5): 100 mg via ORAL
  Filled 2016-11-23 (×2): qty 2
  Filled 2016-11-23: qty 1
  Filled 2016-11-23 (×2): qty 2

## 2016-11-23 MED ORDER — DEXTROSE 5 % IV SOLN
1.0000 g | Freq: Two times a day (BID) | INTRAVENOUS | Status: AC
  Administered 2016-11-23 – 2016-11-30 (×14): 1 g via INTRAVENOUS
  Filled 2016-11-23 (×21): qty 1

## 2016-11-23 MED ORDER — AMIODARONE HCL IN DEXTROSE 360-4.14 MG/200ML-% IV SOLN
60.0000 mg/h | INTRAVENOUS | Status: DC
  Administered 2016-11-23: 60 mg/h via INTRAVENOUS
  Filled 2016-11-23: qty 200

## 2016-11-23 MED ORDER — AMIODARONE HCL IN DEXTROSE 360-4.14 MG/200ML-% IV SOLN
30.0000 mg/h | INTRAVENOUS | Status: DC
  Administered 2016-11-23 – 2016-11-30 (×15): 30 mg/h via INTRAVENOUS
  Filled 2016-11-23 (×16): qty 200

## 2016-11-23 MED ORDER — METOPROLOL TARTRATE 5 MG/5ML IV SOLN: 2.5000 mg | Freq: Four times a day (QID) | INTRAVENOUS | Status: DC | PRN

## 2016-11-23 MED ORDER — METOPROLOL TARTRATE 5 MG/5ML IV SOLN
2.5000 mg | Freq: Four times a day (QID) | INTRAVENOUS | Status: DC | PRN
  Administered 2016-11-23: 2.5 mg via INTRAVENOUS
  Filled 2016-11-23: qty 5

## 2016-11-23 MED ORDER — ONDANSETRON HCL 4 MG/2ML IJ SOLN
4.0000 mg | Freq: Four times a day (QID) | INTRAMUSCULAR | Status: DC | PRN
  Administered 2016-11-26: 4 mg via INTRAVENOUS
  Filled 2016-11-23: qty 2

## 2016-11-23 MED ORDER — ONDANSETRON HCL 4 MG/2ML IJ SOLN
4.0000 mg | Freq: Once | INTRAMUSCULAR | Status: AC
  Administered 2016-11-23: 4 mg via INTRAVENOUS
  Filled 2016-11-23: qty 2

## 2016-11-23 MED ORDER — PREDNISONE 10 MG PO TABS: 10.0000 mg | ORAL_TABLET | ORAL | Status: DC

## 2016-11-23 MED ORDER — ONDANSETRON HCL 4 MG/2ML IJ SOLN
INTRAMUSCULAR | Status: AC
  Filled 2016-11-23: qty 2

## 2016-11-23 MED ORDER — DIGOXIN 0.25 MG/ML IJ SOLN
0.1250 mg | Freq: Once | INTRAMUSCULAR | Status: AC
  Administered 2016-11-23: 0.125 mg via INTRAVENOUS
  Filled 2016-11-23: qty 2

## 2016-11-23 MED ORDER — SODIUM CHLORIDE 0.9 % IV SOLN
1000.0000 mL | Freq: Once | INTRAVENOUS | Status: AC
  Administered 2016-11-23: 1000 mL via INTRAVENOUS

## 2016-11-23 MED ORDER — ENSURE ENLIVE PO LIQD
237.0000 mL | Freq: Two times a day (BID) | ORAL | Status: DC
  Administered 2016-11-23 – 2016-12-06 (×10): 237 mL via ORAL

## 2016-11-23 MED ORDER — ADULT MULTIVITAMIN W/MINERALS CH
1.0000 | ORAL_TABLET | Freq: Every day | ORAL | Status: DC
  Administered 2016-11-23 – 2016-11-30 (×6): 1 via ORAL
  Filled 2016-11-23 (×7): qty 1

## 2016-11-23 MED ORDER — ONDANSETRON HCL 4 MG PO TABS: 4.0000 mg | ORAL_TABLET | Freq: Four times a day (QID) | ORAL | Status: DC | PRN

## 2016-11-23 MED ORDER — LEVALBUTEROL HCL 0.63 MG/3ML IN NEBU
0.6300 mg | INHALATION_SOLUTION | Freq: Four times a day (QID) | RESPIRATORY_TRACT | Status: DC | PRN
  Administered 2016-11-24 – 2016-11-25 (×2): 0.63 mg via RESPIRATORY_TRACT
  Filled 2016-11-23 (×2): qty 3

## 2016-11-23 NOTE — Consult Note (Signed)
CARDIOLOGY CONSULT NOTE   Patient ID: Shannon Jackson MRN: [id number] DOB/AGE: 71/11/1946 71 y.o.  Admit Date: 11/22/2016 Referring Physician: TRH-Regalado  Primary Physician: Frederica Kuster, MD Consulting Cardiologist: Prentice Docker MD Primary Cardiologist New Reason for Consultation: Afib with RVR  Clinical Summary Shannon Jackson is a 70 y.o.female with known history of atrial fib, COPD, GERD, PE and failure to thrive, who has been seen at [facility] and treated for same. She is on anticoagulation with Xarelto. CHADS VASC Score of 2. She apparently had been placed in SNF but signed herself out. At home she began to feel weak and was brought to ER. She is a poor historian and therefore history is obtained from current records.   On arrival to ER BP 106/48, HR 140 bpm, R 32 , afebrile. EKG revealed Afib with RVR, HR 169 bpm, with significant artifact. She was found to be hyponatremic, creatinine 0.32, Cl 85, Albumin 3.2. BNP 218, troponin 0.03. CXR negative for CHF or pneumonia, Found to have emphysema atelectasis, possible infective process but not confirmed and aortic arthrosclerosis.  She was given digoxin 0.125 IV, amiodarone bolus and started on a drip. Admitted to ICU.  Remains on amiodarone at 60 mg/hour. Review of home medications does not have her on any rate control medications.  Allergies  Allergen Reactions  . Codeine Nausea And Vomiting  . Levofloxacin     IV form caused swelling in hand, itching and burning.  . Lovenox [Enoxaparin Sodium]     Per EMS    Medications Scheduled Medications: . ceFEPime (MAXIPIME) IV  1 g Intravenous Q12H  . clonazePAM  0.5 mg Oral BID  . diltiazem (CARDIZEM) infusion      . digoxin  0.125 mg Intravenous Once  . hydrocortisone sod succinate (SOLU-CORTEF) inj  50 mg Intravenous Q12H  . pantoprazole  40 mg Oral Daily  . rivaroxaban  20 mg Oral Daily  . sertraline  100 mg Oral Daily  . vancomycin  500 mg Intravenous Q12H  .  vancomycin  1,000 mg Intravenous Once     Infusions: . amiodarone 60 mg/hr (11/23/16 0803)   Followed by  . amiodarone       PRN Medications:  levalbuterol, metoprolol, ondansetron **OR** ondansetron (ZOFRAN) IV   Past Medical History:  Diagnosis Date  . A-fib (HCC)   . Allergic rhinitis   . Arthritis    left hip  . Cataract   . COPD (chronic obstructive pulmonary disease) (HCC)   . Failure to thrive in adult   . GERD (gastroesophageal reflux disease)   . Hyperlipidemia   . Osteopenia   . Osteoporosis 08/2016  . Pulmonary embolism (HCC)   . Raynaud's syndrome     Past Surgical History:  Procedure Laterality Date  . FOOT NEUROMA SURGERY    . TUBAL LIGATION      Family History  Problem Relation Age of Onset  . Melanoma Mother   . Emphysema Father   . Arthritis Father   . Cancer Brother     skin, melanoma    Social History Shannon Jackson reports that she quit smoking about 9 years ago. Her smoking use included Cigarettes. She has a 25.00 pack-year smoking history. She has never used smokeless tobacco. [name] reports that she does not drink alcohol.  Review of Systems Complete review of systems are found to be negative unless outlined in H&P above.  Physical Examination Blood pressure 105/73, pulse (!) 116, temperature 98.3 F (36.8 C),  temperature source Oral, resp. rate (!) 27, height 5\' 6"  (1.676 m), weight 123 lb 7.3 oz (56 kg), SpO2 100 %.  Intake/Output Summary (Last 24 hours) at 11/23/16 0856 Last data filed at 11/23/16 0803  Gross per 24 hour  Intake          2807.12 ml  Output                0 ml  Net          2807.12 ml    Telemetry: Atrial fib, one episode of sinus pause, and return to atrial fib, rates in the 100-120 bpm   WUJ:WJXBJGEN:Frail. Lethargic.  HEENT: Conjunctiva and lids normal, oropharynx clear with moist mucosa. Neck: Supple, no elevated JVP or carotid bruits, no thyromegaly. Lungs: Diminished in the bases, with crackles noted. Poor  inspiratory effort.  Cardiac: Iregular rate, and rhythm,tachycardic, no S3 or significant systolic murmur, no pericardial rub. Abdomen: Soft, nontender, no hepatomegaly, bowel sounds present, no guarding or rebound. Extremities: No pitting edema, distal pulses 2+. Skin: Warm and dry. Multiple bruises  Musculoskeletal: No kyphosis. Neuropsychiatric: Lethargic but responsive,oriented x 2  affect flat.   Prior Cardiac Testing/Procedures Review of Care Everywhere does not have cardiology documentation.  Lab Results  Basic Metabolic Panel:  Recent Labs Lab 11/23/16 0105 11/23/16 0742  NA 130* 135  K 4.1 3.8  CL 85* 93*  CO2 40* 39*  GLUCOSE 104* 106*  BUN 17 16  CREATININE 0.32* 0.33*  CALCIUM 7.8* 7.2*    Liver Function Tests:  Recent Labs Lab 11/23/16 0105  AST 26  ALT 58*  ALKPHOS 56  BILITOT 1.1  PROT 5.2*  ALBUMIN 3.2*    CBC:  Recent Labs Lab 11/23/16 0105 11/23/16 0742  WBC 18.7* 16.0*  NEUTROABS 17.1*  --   HGB 11.9* 10.8*  HCT 36.5 34.4*  MCV 97.3 98.9  PLT 139* 122*    Cardiac Enzymes:  Recent Labs Lab 11/23/16 0105  TROPONINI 0.03*    Radiology: Dg Chest Portable 1 View  Result Date: 11/23/2016 CLINICAL DATA:  Initial evaluation for acute chest pain, weakness. EXAM: PORTABLE CHEST 1 VIEW COMPARISON:  Prior radiograph from 11/03/2016. FINDINGS: Transverse heart size within normal limits. Mediastinal silhouette normal. Aortic atherosclerosis noted. Lungs mildly hypoinflated. Changes related to emphysema noted. There is hazy density overlying the left lung base, which could reflect atelectasis and/or overlying soft tissue attenuation. Possible infiltrate not excluded. No other focal infiltrates. No pulmonary edema or pleural effusion. No pneumothorax. No acute osseous abnormality. IMPRESSION: 1. Hazy opacity overlying the peripheral left lung base. While this finding may be related to atelectasis and/or overlying soft tissue attenuation, possible  infectious infiltrate could be considered in the correct clinical setting. 2. Emphysema. 3. Aortic atherosclerosis. Electronically Signed   By: Rise MuBenjamin  McClintock M.D.   On: 11/23/2016 01:41     ECG: Atrial fib with RVR: rate of 169 bpm.    Impression and Recommendations  1.Atrial fib with RVR: She remains in atrial fib but rate is better controlled. She is on amiodarone gtt at 60mg /hr. Will continue protocol with decreased dose to 30 mg/hr. She is also on digoxin loading. On review of home medications she is not on rate control medications. I have requested prior records from [facility]. Continue Xarelto, CHADS VASC Score of 2. Some anemia is noted but no active bleeding. Rate control with continued loading, no BB in the setting of COPD. Diltiazem caused significant hypotension in ER.   2. HCAP:  Being treated with antibiotics and IV fluids.   3.  Failure to Thrive : Appetite is poor. Recommend dietary consult.    Signed: Bettey MareKathryn M. Lawrence NP AACC  11/23/2016, 8:56 AM Co-Sign MD  The patient was seen and examined, and I agree with the history, physical exam, assessment and plan as documented above, with modifications as noted below. Patient with aforementioned history and [facility] with pneumonia and rapid atrial fibrillation. Currently being loaded with IV amiodarone and anticoagulated with Xarelto. Has received two doses of IV digoxin. Will try to obtain [facility] to see if echocardiogram was performed. HR will be easier to control once pneumonia is treated. For now, continue current therapy. Will eventually need AV nodal blocking agents as BP permits.  Prentice DockerSuresh Zakaiya Lares, MD, [facility]  11/23/2016 10:26 AM

## 2016-11-23 NOTE — Progress Notes (Signed)
Pharmacy Antibiotic Note  Shannon Jackson is a 70 y.o. female admitted on 11/22/2016 with pneumonia.  [facility]arm[facility].  Plan: Vancomycin 1000mg  IV x 1 then 500mg  IV q12hrs Check trough at steady state Cefepime 1gm IV q12hrs Monitor labs, progress, c/s  Height: 5\' 6"  (167.6 cm) Weight: 123 lb 7.3 oz (56 kg) IBW/kg (Calculated) : 59.3  Temp (24hrs), Avg:98.1 F (36.7 C), Min:97.8 F (36.6 C), Max:98.3 F (36.8 C)   Recent Labs Lab 11/23/16 0105  WBC 18.7*  CREATININE 0.32*    Estimated Creatinine Clearance: 57 mL/min (by C-G formula based on SCr of 0.32 mg/dL (L)).    Allergies  Allergen Reactions  . Codeine Nausea And Vomiting  . Levofloxacin     IV form caused swelling in hand, itching and burning.  . Lovenox [Enoxaparin Sodium]     Per EMS    Antimicrobials this admission: Vancomycin 12/19 >>  Cefepime 12/19 >>   Dose adjustments this admission:  Microbiology results:  BCx: pending  UCx: pending   Sputum:    MRSA PCR:   Thank you for allowing pharmacy to be a part of this patient's care.  Valrie HartHall, Nikeshia Keetch A 11/23/2016 7:40 AM

## 2016-11-23 NOTE — Care Management Obs Status (Signed)
MEDICARE OBSERVATION STATUS NOTIFICATION   Patient Details  Name: Shannon Jackson MRN: [id number] Date of Birth: September 30, 1946   Medicare Observation Status Notification Given:  Yes    Malcolm MetroChildress, Filomena Pokorney Demske, RN 11/23/2016, 1:52 PM

## 2016-11-23 NOTE — Care Management Note (Signed)
Case Management Note  Patient Details  Name: Shannon Jackson MRN: [id number] Date of Birth: Dec 30, 1945  Subjective/Objective:                  Pt admitted from home with a-fib RVR. [facility] and was DC'd to [facility]. Pt discharged herself from [facility] on 11/21/2016. Pt lives alone, she has  Son and 3 sisters for support. Pt states she is unable to walk. He son carried her into the house. She was able to sit and feed herself prior to coming into the hospital but this is not her baseline. Pt has no DME to assist with mobility. She has supplemental oxygen PTA. She was using BIPaP at [facility] but does not have one at home. Pt is not getting HH services PTA. Pt and family planning on SNF placement. CSW is aware. Pt will need PT eval when stable. Pt currently OBS, pt explains and understands need for 3-day inpt stay, anticipate pt will have this from previous hospitalization.   Action/Plan: Pt plan on SNF placement. CSW following. CM will cont to follow. PT eval when appropriate.   Expected Discharge Date:     11/23/2016             Expected Discharge Plan:  Skilled Nursing Facility  In-House Referral:  Clinical Social Work  Discharge planning Services  CM Consult  Post Acute Care Choice:  NA Choice offered to:  NA  Status of Service:  In process, will continue to follow  Malcolm MetroChildress, Ariyona Eid Demske, RN 11/23/2016, 3:15 PM

## 2016-11-23 NOTE — Progress Notes (Signed)
PROGRESS NOTE    Shannon Jackson Dario  ZOX:[id number] DOB: [birth year] DOA: 11/22/2016 PCP: Frederica KusterMILLER, STEPHEN M, MD    Brief Narrative: Shannon Jackson Wertenberger  is a 70 y.o. female, With history of atrial fibrillation on Xarelto, COPD, GERD, PE who came to the ED with generalized weakness for past 2 days. Patient found to be in A fib RVR. [facility].   Assessment & Plan:   Active Problems:   COPD (chronic obstructive pulmonary disease) (HCC)   Hyperlipidemia   Atrial fibrillation with rapid ventricular response (HCC)   Atrial fibrillation with RVR (HCC)  1-A fib RVR;  Started on amiodarone, digoxin.  Received IV fluids.  HR elevated, might need Cardizem Gtt if BP allos it.  Cardiology consulted.  On xarelto.  Will add lopressor PRN.   2-Health care associated PNA;  Chest x ray with infiltrates, patient presents with leukocytosis.  Will check blood cultures.  Will start Vancomycin and cefepime.   3-Leukocytosis; treating for PNA.   4-Hyponatremia; received IV fluids.  Labs pending for the morning.   5-GU; patient relate she had foley catheter at [facility]. She doesn't feel urge to urinate. Will follow bladder scan. If unable to empty bladder might need placement foley catheter.   DVT prophylaxis: on xarelto Code Status: full code.  Family Communication: care discussed with patient.  Disposition Plan: Remain in the step down.    Consultants:   Cardiology    Procedures: none   Antimicrobials:  Vancomycin 12-19  Cefepime 12-19   Subjective: She denies dyspnea, chest pain.   Objective: Vitals:   11/23/16 0530 11/23/16 0532 11/23/16 0612 11/23/16 0707  BP: 100/60 102/72    Pulse: (!) 138   (!) 134  Resp: (!) 30   18  Temp:   98.1 F (36.7 C) 98.3 F (36.8 C)  TempSrc:   Oral Oral  SpO2: 98%   99%  Weight:   56 kg (123 lb 7.3 oz)   Height:   5\' 6"  (1.676 m)     Intake/Output Summary (Last 24 hours) at 11/23/16 0724 Last data filed at  11/23/16 0437  Gross per 24 hour  Intake             2500 ml  Output                0 ml  Net             2500 ml   Filed Weights   11/22/16 2338 11/23/16 0612  Weight: 50.3 kg (111 lb) 56 kg (123 lb 7.3 oz)    Examination:  General exam: Appears calm and comfortable  Respiratory system: Clear to auscultation. Respiratory effort normal. Cardiovascular system: S1 & S2 heard, RRR. No JVD, murmurs, rubs, gallops or clicks. No pedal edema. Gastrointestinal system: Abdomen is nondistended, soft and nontender. No organomegaly or masses felt. Normal bowel sounds heard. Central nervous system: Alert and oriented. No focal neurological deficits. Extremities: Symmetric 5 x 5 power. Skin: No rashes, lesions or ulcers     Data Reviewed: I have personally reviewed following labs and imaging studies  CBC:  Recent Labs Lab 11/23/16 0105  WBC 18.7*  NEUTROABS 17.1*  HGB 11.9*  HCT 36.5  MCV 97.3  PLT 139*   Basic Metabolic Panel:  Recent Labs Lab 11/23/16 0105  NA 130*  K 4.1  CL 85*  CO2 40*  GLUCOSE 104*  BUN 17  CREATININE 0.32*  CALCIUM 7.8*   GFR: Estimated  Creatinine Clearance: 57 mL/min (by C-G formula based on SCr of 0.32 mg/dL (L)). Liver Function Tests:  Recent Labs Lab 11/23/16 0105  AST 26  ALT 58*  ALKPHOS 56  BILITOT 1.1  PROT 5.2*  ALBUMIN 3.2*   No results for input(s): LIPASE, AMYLASE in the last 168 hours. No results for input(s): AMMONIA in the last 168 hours. Coagulation Profile: No results for input(s): INR, PROTIME in the last 168 hours. Cardiac Enzymes:  Recent Labs Lab 11/23/16 0105  TROPONINI 0.03*   BNP (last 3 results) No results for input(s): PROBNP in the last 8760 hours. HbA1C: No results for input(s): HGBA1C in the last 72 hours. CBG: No results for input(s): GLUCAP in the last 168 hours. Lipid Profile: No results for input(s): CHOL, HDL, LDLCALC, TRIG, CHOLHDL, LDLDIRECT in the last 72 hours. Thyroid Function  Tests: No results for input(s): TSH, T4TOTAL, FREET4, T3FREE, THYROIDAB in the last 72 hours. Anemia Panel: No results for input(s): VITAMINB12, FOLATE, FERRITIN, TIBC, IRON, RETICCTPCT in the last 72 hours. Sepsis Labs: No results for input(s): PROCALCITON, LATICACIDVEN in the last 168 hours.  No results found for this or any previous visit (from the past 240 hour(s)).       Radiology Studies: Dg Chest Portable 1 View  Result Date: 11/23/2016 CLINICAL DATA:  Initial evaluation for acute chest pain, weakness. EXAM: PORTABLE CHEST 1 VIEW COMPARISON:  Prior radiograph from 11/03/2016. FINDINGS: Transverse heart size within normal limits. Mediastinal silhouette normal. Aortic atherosclerosis noted. Lungs mildly hypoinflated. Changes related to emphysema noted. There is hazy density overlying the left lung base, which could reflect atelectasis and/or overlying soft tissue attenuation. Possible infiltrate not excluded. No other focal infiltrates. No pulmonary edema or pleural effusion. No pneumothorax. No acute osseous abnormality. IMPRESSION: 1. Hazy opacity overlying the peripheral left lung base. While this finding may be related to atelectasis and/or overlying soft tissue attenuation, possible infectious infiltrate could be considered in the correct clinical setting. 2. Emphysema. 3. Aortic atherosclerosis. Electronically Signed   By: Rise MuBenjamin  McClintock M.D.   On: 11/23/2016 01:41        Scheduled Meds: . clonazePAM  0.5 mg Oral BID  . diltiazem (CARDIZEM) infusion      . digoxin  0.125 mg Intravenous Once  . pantoprazole  40 mg Oral Daily  . predniSONE  10 mg Oral QODAY  . rivaroxaban  20 mg Oral Daily  . sertraline  100 mg Oral Daily   Continuous Infusions: . amiodarone 60 mg/hr (11/23/16 0450)   Followed by  . amiodarone       LOS: 1 day    Time spent: 35 minutes.     Alba Coryegalado, Finnlee Guarnieri A, MD [facility] Pager [phone]  If 7PM-7AM, please contact  night-coverage [email or web address] Password [email or web address] 11/23/2016, 7:24 AM

## 2016-11-23 NOTE — Progress Notes (Signed)
Initial Nutrition Assessment   INTERVENTION:  Ensure Enlive po BID, each supplement provides 350 kcal and 20 grams of protein   Recommend liberalize diet to regular (due to poor meal intake)  Add MVI daily and recommend she continue both multivitamin and Ensure BID at home   NUTRITION DIAGNOSIS:   Increased nutrient needs related to chronic illness as evidenced by meal completion < 50%.  GOAL:   Patient will meet greater than or equal to 90% of their needs  MONITOR:   PO intake, Supplement acceptance, Labs, Weight trends  REASON FOR ASSESSMENT:   Consult Assessment of nutrition requirement/status  ASSESSMENT:  Per ZO:XWRUD:Shannon Jackson  is a 70 y.o. female, With history of atrial fibrillation on Xarelto, COPD, GERD, PE who came to the ED with generalized weakness for past 2 days. Patient [facility] couple of weeks ago and diagnosed with atrial fibrillation and failure to thrive at that time she was admitted to ICU and had several episodes of atrial fibrillation. [facility] and [place] yesterday. Patient signed herself out from [facility] on 11/21/2016 as she was not happy with the care provided at [facility].  Patient is eating lunch during RD visit. She is complaining that she doesn't like the food. We talk about meal options from the grill or salads, sandwiches etc vs hot food meat and vegetables.  Patient is able to feed herself and denies chewing or swallow problems. Her Ensure Shiela Mayernlive is here and she has consumed ~100%. She says, "I drink Ensure at home". She says I just  eat when I'm hungry. She has managed to maintain a stable weight for more than  3 years with her current meal pattern. She is at risk for malnutrition related to her acute and chronic disease.   Nutrition-Focused physical exam-deferred at this time.    Recent Labs Lab 11/23/16 0105 11/23/16 0742  NA  130* 135  K 4.1 3.8  CL 85* 93*  CO2 40* 39*  BUN 17 16  CREATININE 0.32* 0.33*  CALCIUM 7.8* 7.2*  GLUCOSE 104* 106*  Labs: H/H 10.8/ 34.4, WBC-16 , UA -cultures pending  Meds: PPI  Diet Order:  Diet Heart Room service appropriate? Yes; Fluid consistency: Thin  Skin:  Reviewed, no issues  Last BM:  12/18  Height:   Ht Readings from Last 1 Encounters:  11/23/16 5\' 6"  (1.676 m)    Weight:   Wt Readings from Last 1 Encounters:  11/23/16 123 lb 7.3 oz (56 kg)    Ideal Body Weight:  59 kg  BMI:  Body mass index is 19.93 kg/m.  Estimated Nutritional Needs:   Kcal:  [id number]  Protein:  73-78 gr  Fluid:  1.7 liters daily  EDUCATION NEEDS:   No education needs identified at this time  Royann ShiversLynn Malia Corsi MS,RD,CSG,LDN Office: #[phone] Pager: [phone]

## 2016-11-23 NOTE — ED Notes (Signed)
CRITICAL VALUE ALERT  Critical value received:  Troponin 0.03  Date of notification:  11/23/2016  Time of notification:  0152  Critical value read back:Yes.    Nurse who received alert:  Neldon Mcina Jandel Patriarca RN  MD notified (1st page):  Dr. Lynelle DoctorKnapp @ [phone]  Time of first page:    MD notified (2nd page):  Time of second page:  Responding MD:    Time MD responded:

## 2016-11-23 NOTE — H&P (Signed)
TRH H&P    Patient Demographics:    Shannon Jackson, is a 70 y.o. female  MRN: [id number]  DOB - [birth year]  Admit Date - 11/22/2016  Referring MD/NP/PA: Dr. Lynelle DoctorKnapp  Outpatient Primary MD for the patient is Frederica KusterMILLER, STEPHEN M, MD  Patient coming from: Home  Chief Complaint  Patient presents with  . Weakness      HPI:    Shannon GoldMary Jackson  is a 70 y.o. female, With history of atrial fibrillation on Xarelto, COPD, GERD, PE who came to the ED with generalized weakness for past 2 days. Patient was seen [facility] couple of weeks ago and diagnosed with atrial fibrillation and failure to thrive at that time she was admitted to ICU and had several episodes of atrial fibrillation. [facility] and [place] care yesterday. Patient signed herself out from [facility] on 11/21/2016 as she was not happy with the care provided at [facility]. At home patient started feeling generalized weakness and was brought to hospital for further evaluation. She denies chest pain, mild shortness of breath. + nausea, no vomiting or diarrhea.  In the ED patient was found to be in A. fib with RVR, cardiology fellow was consulted by ED physician. Patient started on amiodarone infusion, also given diltiazem.    Review of systems:    In addition to the HPI above,  No Fever-chills, No Headache, No changes with Vision or hearing, No problems swallowing food or Liquids, No Abdominal pain, No Nausea or Vomiting, bowel movements are regular, No Blood in stool or Urine, No dysuria, No new skin rashes or bruises, No new joints pains-aches,  No new weakness, tingling, numbness in any extremity, No recent weight gain or loss, No polyuria, polydypsia or polyphagia, No significant Mental Stressors.  A full 10 point Review of Systems was done, except as  stated above, all other Review of Systems were negative.   With Past History of the following :    Past Medical History:  Diagnosis Date  . A-fib (HCC)   . Allergic rhinitis   . Arthritis    left hip  . Cataract   . COPD (chronic obstructive pulmonary disease) (HCC)   . Failure to thrive in adult   . GERD (gastroesophageal reflux disease)   . Hyperlipidemia   . Osteopenia   . Osteoporosis 08/2016  . Pulmonary embolism (HCC)   . Raynaud's syndrome       Past Surgical History:  Procedure Laterality Date  . FOOT NEUROMA SURGERY    . TUBAL LIGATION        Social History:      Social History  Substance Use Topics  . Smoking status: Former Smoker    Packs/day: 1.00    Years: 25.00    Types: Cigarettes    Quit date: 12/06/2006  . Smokeless tobacco: Never Used  . Alcohol use No       Family History :     Family History  Problem Relation Age of Onset  . Melanoma Mother   . Emphysema Father   . Arthritis Father   . Cancer Brother     skin, melanoma      Home Medications:   Prior to Admission medications   Medication Sig Start Date End Date Taking? Authorizing Provider  albuterol (PROVENTIL HFA;VENTOLIN HFA) 108 (90 Base) MCG/ACT inhaler Inhale 2 puffs into the lungs every 6 (six) hours as needed. 09/22/16   Frederica Kuster, MD  alendronate (FOSAMAX) 70 MG tablet Take 1 tablet (70 mg total) by mouth every 7 (seven) days. Take with a full glass of water on an empty stomach. 09/16/16   Frederica Kuster, MD  azithromycin (ZITHROMAX) 250 MG tablet Take 1 tablet by mouth every Monday, Wednesday, and Friday. 06/21/16   Historical Provider, MD  clonazePAM (KLONOPIN) 0.5 MG tablet Take 0.5 mg by mouth 2 (two) times daily.  11/21/14   Historical Provider, MD  Nutritional Supplements (ENSURE HIGH PROTEIN PO) Take by mouth daily.    Historical Provider, MD  pantoprazole (PROTONIX) 40 MG tablet TAKE 1 TABLET BY MOUTH EVERY DAY 10/07/16   Frederica Kuster, MD  predniSONE  (DELTASONE) 10 MG tablet Take 10 mg by mouth every other day.  05/05/13   Historical Provider, MD  Pseudoephedrine-Guaifenesin (MUCINEX D) 3212308120 MG TB12 Take 1 tablet by mouth 2 (two) times daily as needed.     Historical Provider, MD  sertraline (ZOLOFT) 100 MG tablet Take 1 tablet by mouth daily. 07/23/16   Historical Provider, MD  SPIRIVA RESPIMAT 2.5 MCG/ACT AERS Take 2 puffs by mouth daily. 07/23/16   Historical Provider, MD  SYMBICORT 160-4.5 MCG/ACT inhaler INHALE 2 PUFFS INTO THE LUNGS TWICE DAILY 01/07/16   Johna Sheriff, MD  Vitamin D, Cholecalciferol, 1000 units CAPS Take 1,000 Units by mouth daily.    Historical Provider, MD  XARELTO 20 MG TABS tablet Take 20 mg by mouth daily. 02/24/15   Historical Provider, MD     Allergies:     Allergies  Allergen Reactions  . Codeine Nausea And Vomiting  . Levofloxacin     IV form caused swelling in hand, itching and burning.  . Lovenox [Enoxaparin Sodium]     Per EMS     Physical Exam:   Vitals  Blood pressure (!) 76/58, pulse (!) 141, temperature 97.8 F (36.6 C), temperature source Oral, resp. rate 24, height 5\' 6"  (1.676 m), weight 50.3 kg (111 lb), SpO2 99 %.  1.  General: Appears in mild generalized distress  2. Psychiatric:  Intact judgement and  insight, awake alert, oriented x 3.  3. Neurologic: No focal neurological deficits, all cranial nerves intact.Strength 5/5 all 4 extremities, sensation intact all 4 extremities, plantars down going.  4. Eyes :  anicteric sclerae, moist conjunctivae with no lid lag. PERRLA.  5. ENMT:  Oropharynx clear with moist mucous membranes and good dentition  6. Neck:  supple, no cervical lymphadenopathy appriciated, No thyromegaly  7. Respiratory : Normal respiratory effort, bibasilar crackles  8. Cardiovascular : Irregular rhythm, no gallops, rubs or murmurs, no leg edema  9. Gastrointestinal:  Positive bowel sounds, abdomen soft, non-tender to palpation,no hepatosplenomegaly,  no rigidity or guarding       10. Skin:  No cyanosis, normal texture and turgor, no rash, lesions or ulcers  11.Musculoskeletal:  Good muscle tone,  joints appear normal , no effusions,  normal range of motion    Data Review:    CBC  Recent Labs Lab 11/23/16 0105  WBC 18.7*  HGB 11.9*  HCT 36.5  PLT 139*  MCV 97.3  MCH 31.7  MCHC 32.6  RDW 12.5  LYMPHSABS 1.0  MONOABS 0.6  EOSABS 0.1  BASOSABS 0.0   ------------------------------------------------------------------------------------------------------------------  Chemistries   Recent Labs Lab 11/23/16 0105  NA 130*  K 4.1  CL 85*  CO2 40*  GLUCOSE 104*  BUN 17  CREATININE 0.32*  CALCIUM 7.8*  AST 26  ALT 58*  ALKPHOS 56  BILITOT 1.1   ------------------------------------------------------------------------------------------------------------------  ------------------------------------------------------------------------------------------------------------------ GFR: Estimated Creatinine Clearance: 51.2 mL/min (by C-G formula based on SCr of 0.32 mg/dL (L)). Liver Function Tests:  Recent Labs Lab 11/23/16 0105  AST 26  ALT 58*  ALKPHOS 56  BILITOT 1.1  PROT 5.2*  ALBUMIN 3.2*   Cardiac Enzymes:  Recent Labs Lab 11/23/16 0105  TROPONINI 0.03*    --------------------------------------------------------------------------------------------------------------- Urine analysis:    Component Value Date/Time   COLORURINE YELLOW 11/23/2016 0226   APPEARANCEUR HAZY (A) 11/23/2016 0226   LABSPEC 1.016 11/23/2016 0226   PHURINE 8.0 11/23/2016 0226   GLUCOSEU NEGATIVE 11/23/2016 0226   HGBUR NEGATIVE 11/23/2016 0226   BILIRUBINUR NEGATIVE 11/23/2016 0226   KETONESUR NEGATIVE 11/23/2016 0226   PROTEINUR 30 (A) 11/23/2016 0226   NITRITE NEGATIVE 11/23/2016 0226   LEUKOCYTESUR NEGATIVE 11/23/2016 0226      Imaging Results:    Dg Chest Portable 1 View  Result Date: 11/23/2016 CLINICAL DATA:   Initial evaluation for acute chest pain, weakness. EXAM: PORTABLE CHEST 1 VIEW COMPARISON:  Prior radiograph from 11/03/2016. FINDINGS: Transverse heart size within normal limits. Mediastinal silhouette normal. Aortic atherosclerosis noted. Lungs mildly hypoinflated. Changes related to emphysema noted. There is hazy density overlying the left lung base, which could reflect atelectasis and/or overlying soft tissue attenuation. Possible infiltrate not excluded. No other focal infiltrates. No pulmonary edema or pleural effusion. No pneumothorax. No acute osseous abnormality. IMPRESSION: 1. Hazy opacity overlying the peripheral left lung base. While this finding may be related to atelectasis and/or overlying soft tissue attenuation, possible infectious infiltrate could be considered in the correct clinical setting. 2. Emphysema. 3. Aortic atherosclerosis. Electronically Signed   By: Rise MuBenjamin  McClintock M.D.   On: 11/23/2016 01:41    My personal review of EKG: Rhythm - Atrial fibrillation with RVR   Assessment & Plan:    Active Problems:   COPD (chronic obstructive pulmonary disease) (HCC)   Hyperlipidemia   Atrial fibrillation with rapid ventricular response (HCC)   Atrial fibrillation with RVR (HCC)   1. Atrial fibrillation with RVR- CHA2DS2VASc score is 3, patient on anti-coagulation with Xarelto. Started on amiodarone infusion as per cardiology fellow recommendation. Will closely monitor in stepdown. We will obtain formal consult with cardiology in a.m. 2. COPD- stable, continue ipratropium when necessary and start Xopenex nebulizers when necessary   DVT Prophylaxis-   patient on Xarelto  AM Labs Ordered, also please review Full Orders  Family Communication: Admission, patients condition and plan of care including tests being ordered have been discussed with the patient and her son at bedside who indicate understanding and agree with the plan and Code Status.  Code Status:  Full  code  Admission status: Observation  Time spent in minutes : 60 minutes   Payten Beaumier S M.D on 11/23/2016 at 5:22 AM  Between 7am to 7pm - Pager - [phone]. After 7pm go to [email or web address] - password [email or web address]  [facility] - Office  [phone]

## 2016-11-23 NOTE — Progress Notes (Signed)
MEDICATION RELATED CONSULT NOTE - INITIAL   Pharmacy Consult for Amiodarone added, potential DDI Allergies  Allergen Reactions  . Codeine Nausea And Vomiting  . Levofloxacin     IV form caused swelling in hand, itching and burning.  . Lovenox [Enoxaparin Sodium]     Per EMS   Patient Measurements: Height: 5\' 6"  (167.6 cm) Weight: 123 lb 7.3 oz (56 kg) IBW/kg (Calculated) : 59.3  Vital Signs: Temp: 98.1 F (36.7 C) (12/19 1116) Temp Source: Oral (12/19 1116) BP: 105/73 (12/19 0800) Pulse Rate: 117 (12/19 1116)  Labs:  Recent Labs  11/23/16 0105 11/23/16 0742  WBC 18.7* 16.0*  HGB 11.9* 10.8*  HCT 36.5 34.4*  PLT 139* 122*  CREATININE 0.32* 0.33*  ALBUMIN 3.2*  --   PROT 5.2*  --   AST 26  --   ALT 58*  --   ALKPHOS 56  --   BILITOT 1.1  --    Estimated Creatinine Clearance: 57 mL/min (by C-G formula based on SCr of 0.33 mg/dL (L)).  Medical History: Past Medical History:  Diagnosis Date  . A-fib (HCC)   . Allergic rhinitis   . Arthritis    left hip  . Cataract   . COPD (chronic obstructive pulmonary disease) (HCC)   . Failure to thrive in adult   . GERD (gastroesophageal reflux disease)   . Hyperlipidemia   . Osteopenia   . Osteoporosis 08/2016  . Pulmonary embolism (HCC)   . Raynaud's syndrome    Medications:  Scheduled:  . ceFEPime (MAXIPIME) IV  1 g Intravenous Q12H  . clonazePAM  0.5 mg Oral BID  . diltiazem (CARDIZEM) infusion      . hydrocortisone sod succinate (SOLU-CORTEF) inj  50 mg Intravenous Q12H  . pantoprazole  40 mg Oral Daily  . rivaroxaban  20 mg Oral Daily  . sertraline  100 mg Oral Daily  . vancomycin  500 mg Intravenous Q12H    Assessment: 70yo female known history of atrial fib, COPD, GERD, PE and failure to thrive, who has [facility] and treated for same. She is on anticoagulation with Xarelto. CHADS VASC Score of 2.  + Xarelto:  Combo may increase rivaroxaban levels, risk of bleeding + Diltiazem:   Avoid combo in pt's with sick sinus syndrome or AV block: may increase levels of both meds, risk of hypotension, bradycardia, AV block, arrhythmias.    Goal of Therapy:  Minimize risk of ADE  Plan:  Monitor for s/sx bleeding complications, hypotension, arrhythmias Consider alternative Rx if ADE observed  Valrie HartHall, Riverlyn Kizziah A 11/23/2016,11:32 AM

## 2016-11-24 ENCOUNTER — Inpatient Hospital Stay (HOSPITAL_COMMUNITY): Payer: Medicare Other

## 2016-11-24 DIAGNOSIS — R0600 Dyspnea, unspecified: Secondary | ICD-10-CM

## 2016-11-24 DIAGNOSIS — I248 Other forms of acute ischemic heart disease: Secondary | ICD-10-CM

## 2016-11-24 LAB — CBC
HEMATOCRIT: 33.1 % — AB (ref 36.0–46.0)
HEMOGLOBIN: 10.7 g/dL — AB (ref 12.0–15.0)
MCH: 32.1 pg (ref 26.0–34.0)
MCHC: 32.3 g/dL (ref 30.0–36.0)
MCV: 99.4 fL (ref 78.0–100.0)
Platelets: 118 10*3/uL — ABNORMAL LOW (ref 150–400)
RBC: 3.33 MIL/uL — AB (ref 3.87–5.11)
RDW: 12.5 % (ref 11.5–15.5)
WBC: 14.7 10*3/uL — ABNORMAL HIGH (ref 4.0–10.5)

## 2016-11-24 LAB — ECHOCARDIOGRAM COMPLETE
HEIGHTINCHES: 66 in
Weight: 2045.87 oz

## 2016-11-24 LAB — COMPREHENSIVE METABOLIC PANEL
ALK PHOS: 53 U/L (ref 38–126)
ALT: 43 U/L (ref 14–54)
ANION GAP: 4 — AB (ref 5–15)
AST: 21 U/L (ref 15–41)
Albumin: 2.6 g/dL — ABNORMAL LOW (ref 3.5–5.0)
BILIRUBIN TOTAL: 0.6 mg/dL (ref 0.3–1.2)
BUN: 17 mg/dL (ref 6–20)
CALCIUM: 7.9 mg/dL — AB (ref 8.9–10.3)
CO2: 36 mmol/L — AB (ref 22–32)
Chloride: 90 mmol/L — ABNORMAL LOW (ref 101–111)
Creatinine, Ser: 0.34 mg/dL — ABNORMAL LOW (ref 0.44–1.00)
GFR calc Af Amer: >60 mL/min (ref >60)
GFR calc non Af Amer: >60 mL/min (ref >60)
GLUCOSE: 117 mg/dL — AB (ref 65–99)
Potassium: 4 mmol/L (ref 3.5–5.1)
Sodium: 130 mmol/L — ABNORMAL LOW (ref 135–145)
TOTAL PROTEIN: 4.6 g/dL — AB (ref 6.5–8.1)

## 2016-11-24 MED ORDER — MOMETASONE FURO-FORMOTEROL FUM 200-5 MCG/ACT IN AERO
2.0000 | INHALATION_SPRAY | Freq: Two times a day (BID) | RESPIRATORY_TRACT | Status: DC
  Administered 2016-11-24 – 2016-11-26 (×5): 2 via RESPIRATORY_TRACT
  Filled 2016-11-24: qty 8.8

## 2016-11-24 NOTE — Progress Notes (Signed)
PROGRESS NOTE    Shannon GoldMary Jackson  EXB:[id number] DOB: [birth year] DOA: 11/22/2016 PCP: Frederica KusterMILLER, STEPHEN M, MD    Brief Narrative: Shannon GoldMary Jackson  is a 70 y.o. female, With history of atrial fibrillation on Xarelto, COPD, GERD, PE who came to the ED with generalized weakness for past 2 days. Patient found to be in A fib RVR. [facility] the [facility].   Assessment & Plan:   Active Problems:   COPD (chronic obstructive pulmonary disease) (HCC)   Hyperlipidemia   Atrial fibrillation with rapid ventricular response (HCC)   Atrial fibrillation with RVR (HCC)  1-A fib RVR;  Started on amiodarone, digoxin.  Received IV fluids.  Cardizem cause hypotension Cardiology consulted and following. Appreciate help/  On xarelto.  Hr still elevated.   2-Health care associated PNA;  Chest x ray with infiltrates, patient presents with leukocytosis.  Follow blood cultures.  Continue with  Vancomycin and cefepime day 2.  WBC trending down.   3-Leukocytosis; treating for PNA. Trending down.   4-Hyponatremia; received IV fluids.  Stable.   5-COPD,   GU; patient relate she had foley catheter at [facility]. She doesn't feel urge to urinate.  Had foley catheter placement.  She will need voiding trial.  Anxiety; continue with klonopin.   DVT prophylaxis: on xarelto Code Status: full code.  Family Communication: care discussed with patient.  Disposition Plan: Remain in the step down.    Consultants:   Cardiology    Procedures: none   Antimicrobials:  Vancomycin 12-19  Cefepime 12-19   Subjective: She denies dyspnea, chest pain.   Objective: Vitals:   11/23/16 2000 11/24/16 0000 11/24/16 0400 11/24/16 0500  BP:      Pulse:      Resp:      Temp: 97.7 F (36.5 C) 97.5 F (36.4 C) 97.8 F (36.6 C)   TempSrc: Oral Axillary Oral   SpO2:      Weight:    58 kg (127 lb 13.9 oz)  Height:        Intake/Output Summary (Last 24 hours) at 11/24/16 0751 Last data filed  at 11/24/16 0500  Gross per 24 hour  Intake           851.17 ml  Output              575 ml  Net           276.17 ml   Filed Weights   11/22/16 2338 11/23/16 0612 11/24/16 0500  Weight: 50.3 kg (111 lb) 56 kg (123 lb 7.3 oz) 58 kg (127 lb 13.9 oz)    Examination:  General exam: Appears calm and comfortable  Respiratory system: Clear to auscultation. Respiratory effort normal. Cardiovascular system: S1 & S2 heard, RRR. No JVD, murmurs, rubs, gallops or clicks. No pedal edema. Gastrointestinal system: Abdomen is nondistended, soft and nontender. No organomegaly or masses felt. Normal bowel sounds heard. Central nervous system: Alert and oriented. No focal neurological deficits. Extremities: Symmetric 5 x 5 power. Skin: No rashes, lesions or ulcers     Data Reviewed: I have personally reviewed following labs and imaging studies  CBC:  Recent Labs Lab 11/23/16 0105 11/23/16 0742 11/24/16 0428  WBC 18.7* 16.0* 14.7*  NEUTROABS 17.1*  --   --   HGB 11.9* 10.8* 10.7*  HCT 36.5 34.4* 33.1*  MCV 97.3 98.9 99.4  PLT 139* 122* 118*   Basic Metabolic Panel:  Recent Labs Lab 11/23/16 0105 11/23/16 0742 11/24/16 0428  NA 130* 135 130*  K 4.1 3.8 4.0  CL 85* 93* 90*  CO2 40* 39* 36*  GLUCOSE 104* 106* 117*  BUN 17 16 17   CREATININE 0.32* 0.33* 0.34*  CALCIUM 7.8* 7.2* 7.9*   GFR: Estimated Creatinine Clearance: 59.1 mL/min (by C-G formula based on SCr of 0.34 mg/dL (L)). Liver Function Tests:  Recent Labs Lab 11/23/16 0105 11/24/16 0428  AST 26 21  ALT 58* 43  ALKPHOS 56 53  BILITOT 1.1 0.6  PROT 5.2* 4.6*  ALBUMIN 3.2* 2.6*   No results for input(s): LIPASE, AMYLASE in the last 168 hours. No results for input(s): AMMONIA in the last 168 hours. Coagulation Profile: No results for input(s): INR, PROTIME in the last 168 hours. Cardiac Enzymes:  Recent Labs Lab 11/23/16 0105  TROPONINI 0.03*   BNP (last 3 results) No results for input(s): PROBNP in the  last 8760 hours. HbA1C: No results for input(s): HGBA1C in the last 72 hours. CBG: No results for input(s): GLUCAP in the last 168 hours. Lipid Profile: No results for input(s): CHOL, HDL, LDLCALC, TRIG, CHOLHDL, LDLDIRECT in the last 72 hours. Thyroid Function Tests: No results for input(s): TSH, T4TOTAL, FREET4, T3FREE, THYROIDAB in the last 72 hours. Anemia Panel: No results for input(s): VITAMINB12, FOLATE, FERRITIN, TIBC, IRON, RETICCTPCT in the last 72 hours. Sepsis Labs: No results for input(s): PROCALCITON, LATICACIDVEN in the last 168 hours.  Recent Results (from the past 240 hour(s))  MRSA PCR Screening     Status: None   Collection Time: 11/23/16  6:00 AM  Result Value Ref Range Status   MRSA by PCR NEGATIVE NEGATIVE Final    Comment:        The GeneXpert MRSA Assay (FDA approved for NASAL specimens only), is one component of a comprehensive MRSA colonization surveillance program. It is not intended to diagnose MRSA infection nor to guide or monitor treatment for MRSA infections.   Culture, blood (Routine X 2) w Reflex to ID Panel     Status: None (Preliminary result)   Collection Time: 11/23/16  7:38 AM  Result Value Ref Range Status   Specimen Description BLOOD  Final   Special Requests NONE  Final   Culture NO GROWTH < 12 HOURS  Final   Report Status PENDING  Incomplete  Culture, blood (Routine X 2) w Reflex to ID Panel     Status: None (Preliminary result)   Collection Time: 11/23/16  7:45 AM  Result Value Ref Range Status   Specimen Description BLOOD  Final   Special Requests NONE  Final   Culture NO GROWTH < 12 HOURS  Final   Report Status PENDING  Incomplete         Radiology Studies: Dg Chest Portable 1 View  Result Date: 11/23/2016 CLINICAL DATA:  Initial evaluation for acute chest pain, weakness. EXAM: PORTABLE CHEST 1 VIEW COMPARISON:  Prior radiograph from 11/03/2016. FINDINGS: Transverse heart size within normal limits. Mediastinal  silhouette normal. Aortic atherosclerosis noted. Lungs mildly hypoinflated. Changes related to emphysema noted. There is hazy density overlying the left lung base, which could reflect atelectasis and/or overlying soft tissue attenuation. Possible infiltrate not excluded. No other focal infiltrates. No pulmonary edema or pleural effusion. No pneumothorax. No acute osseous abnormality. IMPRESSION: 1. Hazy opacity overlying the peripheral left lung base. While this finding may be related to atelectasis and/or overlying soft tissue attenuation, possible infectious infiltrate could be considered in the correct clinical setting. 2. Emphysema. 3. Aortic atherosclerosis. Electronically Signed  By: Rise Mu M.D.   On: 11/23/2016 01:41        Scheduled Meds: . ceFEPime (MAXIPIME) IV  1 g Intravenous Q12H  . clonazePAM  0.5 mg Oral BID  . feeding supplement (ENSURE ENLIVE)  237 mL Oral BID BM  . hydrocortisone sod succinate (SOLU-CORTEF) inj  50 mg Intravenous Q12H  . mometasone-formoterol  2 puff Inhalation BID  . multivitamin with minerals  1 tablet Oral Daily  . pantoprazole  40 mg Oral Daily  . rivaroxaban  20 mg Oral Daily  . sertraline  100 mg Oral Daily  . vancomycin  500 mg Intravenous Q12H   Continuous Infusions: . amiodarone 30 mg/hr (11/23/16 2208)     LOS: 1 day    Time spent: 35 minutes.     Alba Cory, MD [facility] Pager [phone]  If 7PM-7AM, please contact night-coverage [email or web address] Password [facility] 11/24/2016, 7:51 AM

## 2016-11-24 NOTE — Progress Notes (Signed)
Pateint refusing breathing treatments from Rt. Spoke with patient regarding Lung sounds and need for treatment, patient stated " The treatments make her feel worse".   Spoke with RT to advise patient still sounds very tight and diminished. RT will speak to patient again.

## 2016-11-24 NOTE — Clinical Social Work Note (Addendum)
Clinical Social Work Assessment  Patient Details  Name: Shannon Jackson MRN: [id number] Date of Birth: 04/29/1946  Date of referral:  11/24/16               Reason for consult:  Discharge Planning                Permission sought to share information with:    Permission granted to share information::     Name::        Agency::     Relationship::     Contact Information:     Housing/Transportation Living arrangements for the past 2 months:   [facility] Source of Information:  Patient Patient Interpreter Needed:  None Criminal Activity/Legal Involvement Pertinent to Current Situation/Hospitalization:  No - Comment as needed Significant Relationships:  Adult Children, Siblings Lives with:  Self Do you feel safe going back to the place where you live?  Yes Need for family participation in patient care:  Yes (Comment)  Care giving concerns: Patient stated that she did not have care giving concerns.   Social Worker assessment / plan:  Patient stated "I gotta go home" in regards to her possibly going to placement. Patient stated that at baseline she lives alone, ambulates unassisted, completes ADLs unassisted and drives. Patient stated that she was previously at [facility] and will not go back to a place like that. Patient stated that she just has to go to her home. CSW provided education on the difference between a LTAC and a SNF. Patient continued to state that she would not go to SNF. CSW provided a SNF list to patient. CSW signing off.   Employment status:  Retired Health and safety inspectornsurance information:  Medicare PT Recommendations:  Not assessed at this time Information / Referral to community resources:  Skilled Nursing Facility  Patient/Family's Response to care:  Patient declined SNF.   Patient/Family's Understanding of and Emotional Response to Diagnosis, Current Treatment, and Prognosis:  Patient understands her diagnosis, treatment and prognosis.  Emotional Assessment Appearance:   Appears stated age Attitude/Demeanor/Rapport:   (Cooperative) Affect (typically observed):  Calm Orientation:  Oriented to Self, Oriented to Place, Oriented to  Time, Oriented to Situation Alcohol / Substance use:  Not Applicable Psych involvement (Current and /or in the community):  No (Comment)  Discharge Needs  Concerns to be addressed:  Discharge Planning Concerns Readmission within the last 30 days:  No Current discharge risk:  Chronically ill, Lives alone Barriers to Discharge:  No Barriers Identified   Annice NeedySettle, Rhylee Nunn D, LCSW 11/24/2016, 11:17 AM

## 2016-11-24 NOTE — Progress Notes (Signed)
*  PRELIMINARY RESULTS* Echocardiogram 2D Echocardiogram has been performed.  Jeryl Columbialliott, Aviraj Kentner 11/24/2016, 12:22 PM

## 2016-11-24 NOTE — Progress Notes (Signed)
Cardiologist:new Dr. Purvis SheffieldKoneswaran  Subjective:  Short of breath. Says she needs her breathing treatments.  Objective:  Vital Signs in the last 24 hours: Temp:  [97.5 F (36.4 C)-98.6 F (37 C)] 98 F (36.7 C) (12/20 0815) Pulse Rate:  [117-126] 126 (12/19 1549) Resp:  [23-24] 23 (12/19 1549) SpO2:  [99 %] 99 % (12/19 1549) Weight:  [127 lb 13.9 oz (58 kg)] 127 lb 13.9 oz (58 kg) (12/20 0500)  Intake/Output from previous day: 12/19 0701 - 12/20 0700 In: 851.2 [P.O.:240; I.V.:311.2; IV Piggyback:300] Out: 575 [Urine:575] Intake/Output from this shift: No intake/output data recorded.  Physical Exam: NECK: Without JVD, HJR, or bruit LUNGS: Decreased breath sounds throughout, no rales HEART: irregular rate and rhythm, no murmur, gallop, rub, bruit, thrill, or heave EXTREMITIES: Without cyanosis, clubbing, or edema   Lab Results:  Recent Labs  11/23/16 0742 11/24/16 0428  WBC 16.0* 14.7*  HGB 10.8* 10.7*  PLT 122* 118*    Recent Labs  11/23/16 0742 11/24/16 0428  NA 135 130*  K 3.8 4.0  CL 93* 90*  CO2 39* 36*  GLUCOSE 106* 117*  BUN 16 17  CREATININE 0.33* 0.34*    Recent Labs  11/23/16 0105  TROPONINI 0.03*   Hepatic Function Panel  Recent Labs  11/24/16 0428  PROT 4.6*  ALBUMIN 2.6*  AST 21  ALT 43  ALKPHOS 53  BILITOT 0.6   No results for input(s): CHOL in the last 72 hours. No results for input(s): PROTIME in the last 72 hours.    Cardiac Studies:  Assessment/Plan:   Afib with RVR HR 130-144 this am on IV Amio.and Xarelto. No beta blocker b/c of COPD and didn't tolerate diltiazem secondary to significant hypotension. Rate up now b/c she just ate and awaiting breathing treatment.  COPD exacerbation with HCAP  Elevated troponins-flat probably demand ischemia. Records reviewed from [facility]. No echo done. Will order for later today when rate slower.   LOS: 1 day    Jacolyn ReedyMichele Lenze 11/24/2016, 8:59 AM   Pt seen and examined. Now back  in sinus rhythm. Would transition to oral amiodarone tomorrow. Doubt feasibility of calcium channel blockers given low normal BP. Continue Xarelto.

## 2016-11-25 ENCOUNTER — Inpatient Hospital Stay (HOSPITAL_COMMUNITY): Payer: Medicare Other

## 2016-11-25 DIAGNOSIS — J449 Chronic obstructive pulmonary disease, unspecified: Secondary | ICD-10-CM

## 2016-11-25 DIAGNOSIS — J9622 Acute and chronic respiratory failure with hypercapnia: Secondary | ICD-10-CM

## 2016-11-25 DIAGNOSIS — J9621 Acute and chronic respiratory failure with hypoxia: Secondary | ICD-10-CM

## 2016-11-25 DIAGNOSIS — J9620 Acute and chronic respiratory failure, unspecified whether with hypoxia or hypercapnia: Secondary | ICD-10-CM

## 2016-11-25 LAB — MAGNESIUM: Magnesium: 1.7 mg/dL (ref 1.7–2.4)

## 2016-11-25 LAB — BASIC METABOLIC PANEL
Anion gap: 6 (ref 5–15)
BUN: 14 mg/dL (ref 6–20)
CALCIUM: 7.5 mg/dL — AB (ref 8.9–10.3)
CO2: 37 mmol/L — AB (ref 22–32)
CREATININE: 0.32 mg/dL — AB (ref 0.44–1.00)
Chloride: 85 mmol/L — ABNORMAL LOW (ref 101–111)
GFR calc Af Amer: >60 mL/min (ref >60)
GLUCOSE: 131 mg/dL — AB (ref 65–99)
Potassium: 3.9 mmol/L (ref 3.5–5.1)
Sodium: 128 mmol/L — ABNORMAL LOW (ref 135–145)

## 2016-11-25 LAB — BLOOD GAS, ARTERIAL
Acid-Base Excess: 13.3 mmol/L — ABNORMAL HIGH (ref 0.0–2.0)
BICARBONATE: 35.8 mmol/L — AB (ref 20.0–28.0)
Delivery systems: POSITIVE
Drawn by: 277331
Expiratory PAP: 8
FIO2: 0.4
Inspiratory PAP: 14
O2 Saturation: 96.7 %
PATIENT TEMPERATURE: 37
RATE: 10 resp/min
pCO2 arterial: 71.1 mmHg (ref 32.0–48.0)
pH, Arterial: 7.361 (ref 7.350–7.450)
pO2, Arterial: 81 mmHg — ABNORMAL LOW (ref 83.0–108.0)

## 2016-11-25 LAB — CBC
HEMATOCRIT: 31.3 % — AB (ref 36.0–46.0)
HEMATOCRIT: 29.7 % — AB (ref 36.0–46.0)
Hemoglobin: 10.1 g/dL — ABNORMAL LOW (ref 12.0–15.0)
Hemoglobin: 9.6 g/dL — ABNORMAL LOW (ref 12.0–15.0)
MCH: 32 pg (ref 26.0–34.0)
MCH: 32 pg (ref 26.0–34.0)
MCHC: 32.3 g/dL (ref 30.0–36.0)
MCHC: 32.3 g/dL (ref 30.0–36.0)
MCV: 99.1 fL (ref 78.0–100.0)
MCV: 99 fL (ref 78.0–100.0)
PLATELETS: 109 10*3/uL — AB (ref 150–400)
Platelets: 122 10*3/uL — ABNORMAL LOW (ref 150–400)
RBC: 3.16 MIL/uL — ABNORMAL LOW (ref 3.87–5.11)
RBC: 3 MIL/uL — ABNORMAL LOW (ref 3.87–5.11)
RDW: 12.3 % (ref 11.5–15.5)
RDW: 12.2 % (ref 11.5–15.5)
WBC: 13.8 10*3/uL — ABNORMAL HIGH (ref 4.0–10.5)
WBC: 13.5 10*3/uL — ABNORMAL HIGH (ref 4.0–10.5)

## 2016-11-25 LAB — VANCOMYCIN, TROUGH: VANCOMYCIN TR: 10 ug/mL — AB (ref 15–20)

## 2016-11-25 LAB — BRAIN NATRIURETIC PEPTIDE: B Natriuretic Peptide: 119 pg/mL — ABNORMAL HIGH (ref 0.0–100.0)

## 2016-11-25 MED ORDER — AMIODARONE LOAD VIA INFUSION
150.0000 mg | Freq: Once | INTRAVENOUS | Status: AC
  Administered 2016-11-25: 150 mg via INTRAVENOUS
  Filled 2016-11-25: qty 83.34

## 2016-11-25 MED ORDER — DIGOXIN 0.25 MG/ML IJ SOLN: 0.2500 mg | Freq: Four times a day (QID) | INTRAMUSCULAR | Status: DC

## 2016-11-25 MED ORDER — VANCOMYCIN HCL IN DEXTROSE 750-5 MG/150ML-% IV SOLN
750.0000 mg | Freq: Two times a day (BID) | INTRAVENOUS | Status: DC
  Administered 2016-11-25 – 2016-11-29 (×7): 750 mg via INTRAVENOUS
  Filled 2016-11-25 (×15): qty 150

## 2016-11-25 MED ORDER — LORAZEPAM 2 MG/ML IJ SOLN
0.5000 mg | Freq: Four times a day (QID) | INTRAMUSCULAR | Status: DC | PRN
  Administered 2016-11-25 (×2): 1 mg via INTRAVENOUS
  Administered 2016-11-26: 0.5 mg via INTRAVENOUS
  Administered 2016-11-28: 1 mg via INTRAVENOUS
  Administered 2016-11-30 – 2016-12-01 (×2): 0.5 mg via INTRAVENOUS
  Filled 2016-11-25 (×7): qty 1

## 2016-11-25 MED ORDER — FUROSEMIDE 10 MG/ML IJ SOLN
20.0000 mg | Freq: Two times a day (BID) | INTRAMUSCULAR | Status: DC
  Administered 2016-11-25 – 2016-11-26 (×4): 20 mg via INTRAVENOUS
  Filled 2016-11-25 (×4): qty 2

## 2016-11-25 MED ORDER — AMIODARONE HCL 150 MG/3ML IV SOLN: 150.0000 mg | Freq: Once | INTRAVENOUS | Status: DC

## 2016-11-25 MED ORDER — IPRATROPIUM BROMIDE 0.02 % IN SOLN
0.5000 mg | Freq: Four times a day (QID) | RESPIRATORY_TRACT | Status: DC
Start: 2016-11-25 — End: 2016-12-03
  Administered 2016-11-25 – 2016-12-03 (×32): 0.5 mg via RESPIRATORY_TRACT
  Filled 2016-11-25 (×32): qty 2.5

## 2016-11-25 MED ORDER — SODIUM CHLORIDE 0.9 % IV SOLN
INTRAVENOUS | Status: DC
  Administered 2016-11-25: 300 mL via INTRAVENOUS

## 2016-11-25 MED ORDER — LEVALBUTEROL HCL 0.63 MG/3ML IN NEBU
0.6300 mg | INHALATION_SOLUTION | Freq: Four times a day (QID) | RESPIRATORY_TRACT | Status: DC
  Administered 2016-11-25 – 2016-12-03 (×32): 0.63 mg via RESPIRATORY_TRACT
  Filled 2016-11-25 (×32): qty 3

## 2016-11-25 MED ORDER — VANCOMYCIN HCL IN DEXTROSE 750-5 MG/150ML-% IV SOLN
INTRAVENOUS | Status: AC
  Filled 2016-11-25: qty 150

## 2016-11-25 MED ORDER — DIGOXIN 125 MCG PO TABS: 0.1250 mg | ORAL_TABLET | Freq: Every day | ORAL | Status: DC

## 2016-11-25 MED ORDER — DIGOXIN 0.25 MG/ML IJ SOLN: 0.5000 mg | Freq: Once | INTRAMUSCULAR | Status: DC

## 2016-11-25 NOTE — Progress Notes (Signed)
Patient Name: Shannon Jackson Date of Encounter: 11/25/2016  Primary Cardiologist: Prentice DockerKoneswaran, Suresh MD  Hospital Problem List     Principal Problem:   Atrial fibrillation with rapid ventricular response [facility](HCC) Active Problems:   COPD (chronic obstructive pulmonary disease) (HCC)   Hyperlipidemia   Acute on chronic respiratory failure (HCC)    Subjective   I can't breath. I feel awful. No chest pain.  Inpatient Medications    Scheduled Meds: . amiodarone  150 mg Intravenous Once  . ceFEPime (MAXIPIME) IV  1 g Intravenous Q12H  . feeding supplement (ENSURE ENLIVE)  237 mL Oral BID BM  . furosemide  20 mg Intravenous BID  . hydrocortisone sod succinate (SOLU-CORTEF) inj  50 mg Intravenous Q12H  . ipratropium  0.5 mg Nebulization Q6H  . levalbuterol  0.63 mg Nebulization Q6H  . mometasone-formoterol  2 puff Inhalation BID  . multivitamin with minerals  1 tablet Oral Daily  . pantoprazole  40 mg Oral Daily  . rivaroxaban  20 mg Oral Daily  . sertraline  100 mg Oral Daily  . vancomycin  500 mg Intravenous Q12H   Continuous Infusions: . amiodarone 30 mg/hr (11/25/16 0200)   PRN Meds: levalbuterol, LORazepam, ondansetron **OR** ondansetron (ZOFRAN) IV   Vital Signs    Vitals:   11/25/16 0323 11/25/16 0730 11/25/16 0752 11/25/16 0905  BP:    98/76  Pulse:    (!) 120  Resp:    20  Temp: 97.6 F (36.4 C) 97.8 F (36.6 C)    TempSrc: Oral Oral    SpO2:   100% 100%  Weight:      Height:        Intake/Output Summary (Last 24 hours) at 11/25/16 0929 Last data filed at 11/25/16 0200  Gross per 24 hour  Intake          1657.19 ml  Output              825 ml  Net           832.19 ml   Filed Weights   11/22/16 2338 11/23/16 0612 11/24/16 0500  Weight: 111 lb (50.3 kg) 123 lb 7.3 oz (56 kg) 127 lb 13.9 oz (58 kg)    Physical Exam    GEN: Frail, on BIPAP. Neck: Supple, no JVD. Cardiac: IRRR, tachycardic, no gallops.  Respiratory:  Tachypnea, labored, clear to  auscultation bilaterally. Absent in the bases, bilaterally, poor inspiration. GI: Soft, nontender, nondistended, BS + x 4. MS: No deformity or atrophy.  Labs    CBC  Recent Labs  11/23/16 0105  11/24/16 0428 11/25/16 0454  WBC 18.7*  < > 14.7* 13.8*  NEUTROABS 17.1*  --   --   --   HGB 11.9*  < > 10.7* 10.1*  HCT 36.5  < > 33.1* 31.3*  MCV 97.3  < > 99.4 99.1  PLT 139*  < > 118* 109*  < > = values in this interval not displayed. Basic Metabolic Panel  Recent Labs  11/24/16 0428 11/25/16 0454  NA 130* 128*  K 4.0 3.9  CL 90* 85*  CO2 36* 37*  GLUCOSE 117* 131*  BUN 17 14  CREATININE 0.34* 0.32*  CALCIUM 7.9* 7.5*  MG  --  1.7   Liver Function Tests  Recent Labs  11/23/16 0105 11/24/16 0428  AST 26 21  ALT 58* 43  ALKPHOS 56 53  BILITOT 1.1 0.6  PROT 5.2* 4.6*  ALBUMIN 3.2* 2.6*  Cardiac Enzymes  Recent Labs  11/23/16 0105  TROPONINI 0.03*    Telemetry    Atrial fibrillation with RVR, rates 120's.  Radiology    Dg Chest Port 1 View  Result Date: 11/25/2016 CLINICAL DATA:  Hypoxemia EXAM: PORTABLE CHEST 1 VIEW COMPARISON:  11/23/2016 FINDINGS: Cardiac shadow is within normal limits. The lungs are well aerated bilaterally. Bilateral pleural effusions are noted left greater than right increased from the prior exam. Left retrocardiac atelectasis is noted. No bony abnormality is seen. IMPRESSION: Increasing left basilar atelectasis and pleural effusions bilaterally as described. Electronically Signed   By: Alcide CleverMark  Lukens M.D.   On: 11/25/2016 07:23    Cardiac Studies   Echocardiogram 11/24/2016 Left ventricle: The cavity size was normal. Wall thickness was   increased in a pattern of mild LVH. Systolic function was normal.   The estimated ejection fraction was 60%. Wall motion was normal;   there were no regional wall motion abnormalities. Left   ventricular diastolic function parameters were normal. - Right ventricle: The cavity size was mildly  dilated. - Right atrium: The atrium was mildly dilated. - Tricuspid valve: There was moderate regurgitation. - Pulmonary arteries: PA peak pressure: 60 mm Hg (S).  Patient Profile     Ms. Shannon Jackson is a 70 y.o.female with known history of PAF, COPD, GERD, PE and failure to thrive, admitted with atrial fibrillation with RVR, demand ischemia, in the setting of respiratory failure with COPD and pneumonia, She remains on Xarelto. Converted to NSR on IV amiodarone but back in atrial fibrillation. Echocardiogram shows normal LVEF 60%, pulmonary hypertension. CXR with bilateral pulmonary effusions.   Assessment & Plan    1. Atrial fibrillation with RVR: Heart rate increased and back in atrial fibrillation although on IV amiodarone at 30 mg/hr. She will be given an Amiodarone bolus. Rhythm control will be difficult with tenuous pulmonary status. Not on CCB due to hypotensive response in ER.    2.COPD: Continues on antibiotics. Pulmonary consultation today for increased work of breathing and BIPAP in place. CXR demonstrates worsening pulmonary effusions. Starting on low dose IV lasix to assist. Na 128. BP is soft so will monitor closely.   3. Pneumonia: Treatment per Hospitalist service.   Signed, Joni ReiningKathryn Lawrence, NP 11/25/2016, 9:29 AM   Attending note:  Patient seen and examined. I reviewed recent hospital course and discussed the case with Ms. Lawrence NP as well as Dr. Juanetta GoslingHawkins. Ms. Shannon Jackson is admitted with acute on chronic hypoxic/hypercarbic respiratory failure in the setting of COPD and possible pneumonia. In this setting she has had rapid atrial fibrillation, which initially converted to sinus rhythm on IV amiodarone, although has reverted. She has had worsening respiratory status, on BiPAP this morning, Dr. Juanetta GoslingHawkins is concerned that she will probably require mechanical ventilation. Chest x-ray also shows bilateral pleural effusions. Heart rate is in the 120s today in sinus rhythm, systolic  pressure in the 90s. Lab work reviewed, creatinine 0.3, potassium 3.9, sodium 128, hemoglobin 10.1, platelets 109. Plan is to re-bolus with IV amiodarone and continue drip for now. Also try and achieve diuresis with IV Lasix assuming blood pressure tolerates. She is on Xarelto for stroke prophylaxis. Suspect that rhythm control is going to be quite difficult in the face of worsening pulmonary status so active electrical cardioversion will not be pursued at this time.  Jonelle SidleSamuel G. Baldemar Dady, M.D., F.A.C.C.

## 2016-11-25 NOTE — Progress Notes (Signed)
Pharmacy Antibiotic Note  Shannon Jackson is a 70 y.o. female admitted on 11/22/2016 with pneumonia.  [facility]. Vanc trough today is 10 mcg/ml Plan: Change vanc to 750 mg IV q12hrs Consider dc vanc as MRSA pcr (-) making MRSA PNA unlikely Cont cefepime 1gm IV q12hrs F/u renal function, cultures and clinical course  Height: 5\' 6"  (167.6 cm) Weight: 127 lb 13.9 oz (58 kg) IBW/kg (Calculated) : 59.3  Temp (24hrs), Avg:97.6 F (36.4 C), Min:97.3 F (36.3 C), Max:97.9 F (36.6 C)   Recent Labs Lab 11/23/16 0105 11/23/16 0742 11/24/16 0428 11/25/16 0454 11/25/16 1752  WBC 18.7* 16.0* 14.7* 13.8* 13.5*  CREATININE 0.32* 0.33* 0.34* 0.32*  --   VANCOTROUGH  --   --   --   --  10*    Estimated Creatinine Clearance: 59.1 mL/min (by C-G formula based on SCr of 0.32 mg/dL (L)).    Allergies  Allergen Reactions  . Codeine Nausea And Vomiting  . Levofloxacin     IV form caused swelling in hand, itching and burning.  . Lovenox [Enoxaparin Sodium]     Per EMS    Antimicrobials this admission: Vancomycin 12/19 >>  Cefepime 12/19 >>   Dose adjustments this admission: 12/21>>Vanc dose changed from 500 mg IV q12 to 750 mg IV q12  Microbiology results:  BCx: pending  UCx: pending   Sputum:    MRSA PCR: (-)  Thank you for allowing pharmacy to be a part of this patient's care.  Woodfin GanjaSeay, Fallyn Munnerlyn Poteet 11/25/2016 6:58 PM

## 2016-11-25 NOTE — Consult Note (Signed)
NAMPerlie Jackson:  Jackson, Shannon Jackson                 ACCOUNT NO.:  [id number]  MEDICAL RECORD NO.:  [id number]  LOCATION:  [facility]                         FACILITY:  [facility]  PHYSICIAN:  Aijah Lattner L. Shannon Jackson, M.D.DATE OF BIRTH:  04-Aug-1946  DATE OF CONSULTATION: DATE OF DISCHARGE:                                CONSULTATION   REASON FOR CONSULTATION:  COPD exacerbation, hypoxia.  HISTORY:  This is a 70 year old who has a long known history of atrial fibrillation, COPD, reflux, history of pulmonary embolism, failure to thrive.  She has been in and out of the hospital.  She was admitted here with atrial fibrillation with rapid ventricular response.  She had been placed in a skilled care facility, signed herself out, became weaker and weaker and then arrived here.  When she came to the emergency department, she was found to be hyponatremic, low albumin, and had COPD on x-ray.  She was treated with amiodarone, but has become increasingly short of breath and hypoxic.  When I went into see her this morning, she was drifting off to sleep while I was talking to her and dropped her oxygen saturation into the 70s.  I increased her oxygen per nasal cannula, and she is going to be started on BiPAP.  PAST MEDICAL HISTORY:  Her past medical history is positive for atrial fibrillation, chronic allergic rhinitis, COPD, failure to thrive, GERD, pulmonary embolism, Raynaud's phenomenon.  PAST SURGICAL HISTORY:  Surgically, she has had neuroma and a tubal ligation.  MEDICATIONS:  Reviewed.  ALLERGIES:  She is allergic to codeine, Levaquin, and Lovenox.  FAMILY HISTORY:  Positive for COPD in her father.  SOCIAL HISTORY:  She stopped smoking about 9 years ago.  Has a 50-pack- year smoking history.  Does not drink any alcohol.  Because of her status, I cannot do review of systems at this point.  History is mostly obtained from the chart.  She does say that she sees a pulmonary doctor, Dr. Orson Jackson, in  [place].  PHYSICAL EXAMINATION:  CONSTITUTIONAL:  She is sleepy, sitting up, wearing nasal oxygen, and hypoxic.  Heart rate in the 120s with atrial fibrillation. VITAL SIGNS:  Blood pressure is in the 90s. EYES:  Pupils react. EARS/NOSE/MOUTH/THROAT:  Her mucous membranes are dry.  Hearing is grossly normal.  CARDIOVASCULAR:  Her heart is irregular.  I do not hear a gallop. RESPIRATORY:  Her respiratory effort is increased, and as mentioned, she is fairly markedly hypoxic, although when I increased her to 8 L nasal cannula, her O2 saturation came up some.  GASTROINTESTINAL:  Her abdomen is soft without masses. SKIN:  Warm and dry. NEUROLOGICAL:  She is sleepy but arousable, oriented.  ASSESSMENT:  She was admitted with atrial fibrillation with rapid ventricular response.  She still has rapid ventricular response.  She has become more hypoxic as her hospitalization has gone on.  This morning, she is very sluggish, and I am concerned that she has CO2 retention.  Chest x-ray done this morning, which I have personally reviewed, shows left basilar atelectasis and pleural effusion, potentially pneumonia as well considering her clinical situation.  We will plan to place her on BiPAP.  Check blood gas  about an hour later. If she is clearly not getting better on BiPAP, then I think we should proceed with intubation and mechanical ventilation, and she is very high risk if that is going to have to happen.     Shannon Jackson L. Shannon Jackson, M.D.     ELH/MEDQ  D:  11/25/2016  T:  11/25/2016  Job:  [id number]

## 2016-11-25 NOTE — Progress Notes (Signed)
PROGRESS NOTE    Shannon Jackson  WUJ:[id number] DOB: 07-19-1946 DOA: 11/22/2016 PCP: Frederica KusterMILLER, STEPHEN M, MD    Brief Narrative: Shannon GoldMary Jackson  is a 70 y.o. female, With history of atrial fibrillation on Xarelto, COPD, GERD, PE who came to the ED with generalized weakness for past 2 days. Patient found to be in A fib RVR. [facility].  Presents with A fib RVR, Health care associated PNA. Treating PNA with broad spectrum antibiotics, currently on IV amiodarone for A fib. Patient today more dyspnea, tachypnea. She will be started on BIPAP, IV lasix ordered and Pulmonology consulted.      Assessment & Plan:   Active Problems:   COPD (chronic obstructive pulmonary disease) (HCC)   Hyperlipidemia   Atrial fibrillation with rapid ventricular response (HCC)   Atrial fibrillation with RVR (HCC)  1-A fib RVR;  Started on amiodarone, digoxin.  Cardizem cause hypotension Cardiology consulted and following. Appreciate help/  On xarelto.  Hr still elevated.   2-Acute hypoxic Respiratory Failure: Multifactorial secondary to COPD, A fib, PNA. Pleural effusion  IV lasix , IV antibiotics.  BIPAP for increase work of breathing.  Ativan PRN for anxiety, to help patient to tolerates  BIPAP./  Pulmonary consulted.  Continue nebulizer treatments.    3-Health care associated PNA;  Chest x ray with infiltrates, patient presents with leukocytosis.  Follow blood cultures.  Continue with  Vancomycin and cefepime day 2.  WBC trending down.   3-Leukocytosis; treating for PNA. Trending down.   4-Hyponatremia; received IV fluids.  Worse this morning.  Started on IV lasix by cardiology.  Check urine sodium, urine osmolality.   5-COPD, on nebulizer.   GU; patient relate she had foley catheter at [facility]. She doesn't feel urge to urinate.  Had foley catheter placement.  She will need voiding trial.   Anxiety; change klonopin to ativan IV PRN.    DVT prophylaxis: on  xarelto Code Status: full code.  Family Communication: care discussed with patient.  Disposition Plan: Remain in the step down.    Consultants:   Cardiology    Procedures: none   Antimicrobials:  Vancomycin 12-19  Cefepime 12-19   Subjective: She is not feeling better today/ she report worsening dyspnea. She needs her medications for anxiety/   Objective: Vitals:   11/25/16 0200 11/25/16 0323 11/25/16 0730 11/25/16 0752  BP: (!) 93/58     Pulse: (!) 123     Resp: 17     Temp:  97.6 F (36.4 C) 97.8 F (36.6 C)   TempSrc:  Oral Oral   SpO2: 100%   100%  Weight:      Height:        Intake/Output Summary (Last 24 hours) at 11/25/16 0841 Last data filed at 11/25/16 0200  Gross per 24 hour  Intake          1657.19 ml  Output              825 ml  Net           832.19 ml   Filed Weights   11/22/16 2338 11/23/16 0612 11/24/16 0500  Weight: 50.3 kg (111 lb) 56 kg (123 lb 7.3 oz) 58 kg (127 lb 13.9 oz)    Examination:  General exam: mild distress, tachypnea.  Respiratory system: no wheezing, crackles bases,  Cardiovascular system: S1 & S2 heard, RRR. No JVD, murmurs, rubs, gallops or clicks. No pedal edema. Gastrointestinal system: Abdomen is nondistended,  soft and nontender. No organomegaly or masses felt. Normal bowel sounds heard. Central nervous system: Alert and oriented. No focal neurological deficits. Extremities: Symmetric 5 x 5 power. Skin: No rashes, lesions or ulcers     Data Reviewed: I have personally reviewed following labs and imaging studies  CBC:  Recent Labs Lab 11/23/16 0105 11/23/16 0742 11/24/16 0428 11/25/16 0454  WBC 18.7* 16.0* 14.7* 13.8*  NEUTROABS 17.1*  --   --   --   HGB 11.9* 10.8* 10.7* 10.1*  HCT 36.5 34.4* 33.1* 31.3*  MCV 97.3 98.9 99.4 99.1  PLT 139* 122* 118* 109*   Basic Metabolic Panel:  Recent Labs Lab 11/23/16 0105 11/23/16 0742 11/24/16 0428 11/25/16 0454  NA 130* 135 130* 128*  K 4.1 3.8 4.0 3.9    CL 85* 93* 90* 85*  CO2 40* 39* 36* 37*  GLUCOSE 104* 106* 117* 131*  BUN 17 16 17 14   CREATININE 0.32* 0.33* 0.34* 0.32*  CALCIUM 7.8* 7.2* 7.9* 7.5*  MG  --   --   --  1.7   GFR: Estimated Creatinine Clearance: 59.1 mL/min (by C-G formula based on SCr of 0.32 mg/dL (L)). Liver Function Tests:  Recent Labs Lab 11/23/16 0105 11/24/16 0428  AST 26 21  ALT 58* 43  ALKPHOS 56 53  BILITOT 1.1 0.6  PROT 5.2* 4.6*  ALBUMIN 3.2* 2.6*   No results for input(s): LIPASE, AMYLASE in the last 168 hours. No results for input(s): AMMONIA in the last 168 hours. Coagulation Profile: No results for input(s): INR, PROTIME in the last 168 hours. Cardiac Enzymes:  Recent Labs Lab 11/23/16 0105  TROPONINI 0.03*   BNP (last 3 results) No results for input(s): PROBNP in the last 8760 hours. HbA1C: No results for input(s): HGBA1C in the last 72 hours. CBG: No results for input(s): GLUCAP in the last 168 hours. Lipid Profile: No results for input(s): CHOL, HDL, LDLCALC, TRIG, CHOLHDL, LDLDIRECT in the last 72 hours. Thyroid Function Tests: No results for input(s): TSH, T4TOTAL, FREET4, T3FREE, THYROIDAB in the last 72 hours. Anemia Panel: No results for input(s): VITAMINB12, FOLATE, FERRITIN, TIBC, IRON, RETICCTPCT in the last 72 hours. Sepsis Labs: No results for input(s): PROCALCITON, LATICACIDVEN in the last 168 hours.  Recent Results (from the past 240 hour(s))  MRSA PCR Screening     Status: None   Collection Time: 11/23/16  6:00 AM  Result Value Ref Range Status   MRSA by PCR NEGATIVE NEGATIVE Final    Comment:        The GeneXpert MRSA Assay (FDA approved for NASAL specimens only), is one component of a comprehensive MRSA colonization surveillance program. It is not intended to diagnose MRSA infection nor to guide or monitor treatment for MRSA infections.   Culture, blood (Routine X 2) w Reflex to ID Panel     Status: None (Preliminary result)   Collection Time:  11/23/16  7:38 AM  Result Value Ref Range Status   Specimen Description BLOOD RIGHT ANTECUBITAL  Final   Special Requests BOTTLES DRAWN AEROBIC AND ANAEROBIC 8CC  Final   Culture NO GROWTH 2 DAYS  Final   Report Status PENDING  Incomplete  Culture, blood (Routine X 2) w Reflex to ID Panel     Status: None (Preliminary result)   Collection Time: 11/23/16  7:45 AM  Result Value Ref Range Status   Specimen Description BLOOD LEFT ANTECUBITAL  Final   Special Requests BOTTLES DRAWN AEROBIC AND ANAEROBIC 10CC  Final  Culture NO GROWTH 2 DAYS  Final   Report Status PENDING  Incomplete         Radiology Studies: Dg Chest Port 1 View  Result Date: 11/25/2016 CLINICAL DATA:  Hypoxemia EXAM: PORTABLE CHEST 1 VIEW COMPARISON:  11/23/2016 FINDINGS: Cardiac shadow is within normal limits. The lungs are well aerated bilaterally. Bilateral pleural effusions are noted left greater than right increased from the prior exam. Left retrocardiac atelectasis is noted. No bony abnormality is seen. IMPRESSION: Increasing left basilar atelectasis and pleural effusions bilaterally as described. Electronically Signed   By: Alcide Clever M.D.   On: 11/25/2016 07:23        Scheduled Meds: . amiodarone  150 mg Intravenous Once  . ceFEPime (MAXIPIME) IV  1 g Intravenous Q12H  . feeding supplement (ENSURE ENLIVE)  237 mL Oral BID BM  . furosemide  20 mg Intravenous BID  . hydrocortisone sod succinate (SOLU-CORTEF) inj  50 mg Intravenous Q12H  . ipratropium  0.5 mg Nebulization Q6H  . levalbuterol  0.63 mg Nebulization Q6H  . mometasone-formoterol  2 puff Inhalation BID  . multivitamin with minerals  1 tablet Oral Daily  . pantoprazole  40 mg Oral Daily  . rivaroxaban  20 mg Oral Daily  . sertraline  100 mg Oral Daily  . vancomycin  500 mg Intravenous Q12H   Continuous Infusions: . sodium chloride 300 mL (11/25/16 0755)  . amiodarone 30 mg/hr (11/25/16 0200)     LOS: 2 days    Time spent: 35  minutes.     Alba Cory, MD [facility] Pager [phone]  If 7PM-7AM, please contact night-coverage [email or web address] Password [facility] 11/25/2016, 8:41 AM

## 2016-11-25 NOTE — Progress Notes (Signed)
**Note De-Identified Verdell Dykman Obfuscation** Removed from BIPAP and placed on 3L [place]; tolerating well. RRT to cont. To monitor.

## 2016-11-25 NOTE — Consult Note (Signed)
Full note to follow. When I went in to see her she was sluggish and dropped oxygen saturation in to the 70's. She will start bipap but high risk for need to be intubated and ventilated.

## 2016-11-26 ENCOUNTER — Inpatient Hospital Stay (HOSPITAL_COMMUNITY): Payer: Medicare Other

## 2016-11-26 LAB — BASIC METABOLIC PANEL
Anion gap: 6 (ref 5–15)
BUN: 13 mg/dL (ref 6–20)
CO2: 45 mmol/L — ABNORMAL HIGH (ref 22–32)
CREATININE: 0.33 mg/dL — AB (ref 0.44–1.00)
Calcium: 7.1 mg/dL — ABNORMAL LOW (ref 8.9–10.3)
Chloride: 77 mmol/L — ABNORMAL LOW (ref 101–111)
GFR calc non Af Amer: >60 mL/min (ref >60)
Glucose, Bld: 90 mg/dL (ref 65–99)
Potassium: 3.5 mmol/L (ref 3.5–5.1)
SODIUM: 128 mmol/L — AB (ref 135–145)

## 2016-11-26 LAB — BLOOD GAS, ARTERIAL
Acid-Base Excess: 21.2 mmol/L — ABNORMAL HIGH (ref 0.0–2.0)
BICARBONATE: 44.2 mmol/L — AB (ref 20.0–28.0)
DRAWN BY: 21310
Delivery systems: POSITIVE
Expiratory PAP: 8
FIO2: 40
INSPIRATORY PAP: 16
O2 SAT: 94.3 %
PATIENT TEMPERATURE: 37
PCO2 ART: 69.1 mmHg — AB (ref 32.0–48.0)
PO2 ART: 65 mmHg — AB (ref 83.0–108.0)
pH, Arterial: 7.447 (ref 7.350–7.450)

## 2016-11-26 MED ORDER — SODIUM CHLORIDE 0.9 % IV BOLUS (SEPSIS)
1000.0000 mL | Freq: Once | INTRAVENOUS | Status: AC
  Administered 2016-11-28: 1000 mL via INTRAVENOUS

## 2016-11-26 NOTE — Care Management Important Message (Signed)
Important Message  Patient Details  Name: Shannon GoldMary Jackson MRN: [id number] Date of Birth: 12/23/1945   Medicare Important Message Given:  Yes    Malcolm MetroChildress, Micca Matura Demske, RN 11/26/2016, 10:00 AM

## 2016-11-26 NOTE — Progress Notes (Signed)
Sextremities normal, atraumatic, no cyanosis or edemaubjective: She says she feels a little better. She is wanting to eat. She did better last night with BiPAP and her blood gas this morning is okay and slightly improved from yesterday. She is still coughing. Her heart rate has improved while she was on the BiPAP but is back up now that she's a little more active. Discussed with Dr. Domenic Polite of cardiology yesterday and he feels that at least part of the heart rate issues because of her respiratory situation and I agree. I don't think are going to be able to get her heart rate totally controlled as long as she's having so much respiratory distress. She's not complaining of any chest pain nausea vomiting diarrhea abdominal pain hemoptysis.  Objective: Vital signs in last 24 hours: Temp:  [97.6 F (36.4 C)-98.4 F (36.9 C)] 97.9 F (36.6 C) (12/22 0736) Pulse Rate:  [75-149] 88 (12/22 0737) Resp:  [15-35] 28 (12/22 0737) BP: (69-129)/(47-81) 109/69 (12/22 0737) SpO2:  [71 %-100 %] 98 % (12/22 0737) FiO2 (%):  [40 %] 40 % (12/22 0734) Weight:  [60.6 kg (133 lb 9.6 oz)] 60.6 kg (133 lb 9.6 oz) (12/22 0515) Weight change:  Last BM Date: 11/22/16  Intake/Output from previous day: 12/21 0701 - 12/22 0700 In: 316.7 [I.V.:16.7; IV Piggyback:300] Out: 3150 [Urine:3150]  PHYSICAL EXAM General appearance: alert, cooperative, moderate distress and On BiPAP Resp: rhonchi bilaterally Cardio: Heart rate irregularly irregular. While she is talking to me her heart rate went up to 140 but it had been much better GI: soft, non-tender; bowel sounds normal; no masses,  no organomegaly Extremities: extMucous membranes are moist. Pupils are reactive. Skin warm and dryities normal, atraumatic, no cyanosis or edema Skin warm and dry. Mucous membranes are moist. Pupils are reactive  Lab Results:  Results for orders placed or performed during the hospital encounter of 11/22/16 (from the past 48 hour(s))  CBC      Status: Abnormal   Collection Time: 11/25/16  4:54 AM  Result Value Ref Range   WBC 13.8 (H) 4.0 - 10.5 K/uL   RBC 3.16 (L) 3.87 - 5.11 MIL/uL   Hemoglobin 10.1 (L) 12.0 - 15.0 g/dL   HCT 31.3 (L) 36.0 - 46.0 %   MCV 99.1 78.0 - 100.0 fL   MCH 32.0 26.0 - 34.0 pg   MCHC 32.3 30.0 - 36.0 g/dL   RDW 12.3 11.5 - 15.5 %   Platelets 109 (L) 150 - 400 K/uL    Comment: SPECIMEN CHECKED FOR CLOTS CONSISTENT WITH PREVIOUS RESULT   Basic metabolic panel     Status: Abnormal   Collection Time: 11/25/16  4:54 AM  Result Value Ref Range   Sodium 128 (L) 135 - 145 mmol/L   Potassium 3.9 3.5 - 5.1 mmol/L   Chloride 85 (L) 101 - 111 mmol/L   CO2 37 (H) 22 - 32 mmol/L   Glucose, Bld 131 (H) 65 - 99 mg/dL   BUN 14 6 - 20 mg/dL   Creatinine, Ser 0.32 (L) 0.44 - 1.00 mg/dL   Calcium 7.5 (L) 8.9 - 10.3 mg/dL   GFR calc non Af Amer >60 >60 mL/min   GFR calc Af Amer >60 >60 mL/min    Comment: (NOTE) The eGFR has been calculated using the CKD EPI equation. This calculation has not been validated in all clinical situations. eGFR's persistently <60 mL/min signify possible Chronic Kidney Disease.    Anion gap 6 5 - 15  Magnesium  Status: None   Collection Time: 11/25/16  4:54 AM  Result Value Ref Range   Magnesium 1.7 1.7 - 2.4 mg/dL  Brain natriuretic peptide     Status: Abnormal   Collection Time: 11/25/16  4:54 AM  Result Value Ref Range   B Natriuretic Peptide 119.0 (H) 0.0 - 100.0 pg/mL  Blood gas, arterial     Status: Abnormal   Collection Time: 11/25/16 10:35 AM  Result Value Ref Range   FIO2 0.40    Delivery systems BILEVEL POSITIVE AIRWAY PRESSURE    LHR 10.0 resp/min   Inspiratory PAP 14    Expiratory PAP 8    pH, Arterial 7.361 7.350 - 7.450   pCO2 arterial 71.1 (HH) 32.0 - 48.0 mmHg    Comment: CRITICAL RESULT CALLED TO, READ BACK BY AND VERIFIED WITH: LAWARENCE HILTON, RN AT 7893 BY WENDY VIA, RRT,RCP ON 11/24/2016    pO2, Arterial 81.0 (L) 83.0 - 108.0 mmHg    Bicarbonate 35.8 (H) 20.0 - 28.0 mmol/L   Acid-Base Excess 13.3 (H) 0.0 - 2.0 mmol/L   O2 Saturation 96.7 %   Patient temperature 37.0    Collection site LEFT RADIAL    Drawn by [id number]    Sample type ARTERIAL DRAW    Allens test (pass/fail) PASS PASS  Vancomycin, trough     Status: Abnormal   Collection Time: 11/25/16  5:52 PM  Result Value Ref Range   Vancomycin Tr 10 (L) 15 - 20 ug/mL  CBC     Status: Abnormal   Collection Time: 11/25/16  5:52 PM  Result Value Ref Range   WBC 13.5 (H) 4.0 - 10.5 K/uL   RBC 3.00 (L) 3.87 - 5.11 MIL/uL   Hemoglobin 9.6 (L) 12.0 - 15.0 g/dL   HCT 29.7 (L) 36.0 - 46.0 %   MCV 99.0 78.0 - 100.0 fL   MCH 32.0 26.0 - 34.0 pg   MCHC 32.3 30.0 - 36.0 g/dL   RDW 12.2 11.5 - 15.5 %   Platelets 122 (L) 150 - 400 K/uL  Basic metabolic panel     Status: Abnormal   Collection Time: 11/26/16  4:59 AM  Result Value Ref Range   Sodium 128 (L) 135 - 145 mmol/L   Potassium 3.5 3.5 - 5.1 mmol/L   Chloride 77 (L) 101 - 111 mmol/L   CO2 45 (H) 22 - 32 mmol/L   Glucose, Bld 90 65 - 99 mg/dL   BUN 13 6 - 20 mg/dL   Creatinine, Ser 0.33 (L) 0.44 - 1.00 mg/dL   Calcium 7.1 (L) 8.9 - 10.3 mg/dL   GFR calc non Af Amer >60 >60 mL/min   GFR calc Af Amer >60 >60 mL/min    Comment: (NOTE) The eGFR has been calculated using the CKD EPI equation. This calculation has not been validated in all clinical situations. eGFR's persistently <60 mL/min signify possible Chronic Kidney Disease.    Anion gap 6 5 - 15  Blood gas, arterial     Status: Abnormal   Collection Time: 11/26/16  5:00 AM  Result Value Ref Range   FIO2 40.00    Delivery systems BILEVEL POSITIVE AIRWAY PRESSURE    Inspiratory PAP 16.0    Expiratory PAP 8.0    pH, Arterial 7.447 7.350 - 7.450   pCO2 arterial 69.1 (HH) 32.0 - 48.0 mmHg    Comment: CRITICAL RESULT CALLED TO, READ BACK BY AND VERIFIED WITH: Lavera Guise RN AT 1025 11/26/16 P[place]TM[place] S RRT/RCP  pO2, Arterial 65.0 (L) 83.0 - 108.0 mmHg    Bicarbonate 44.2 (H) 20.0 - 28.0 mmol/L   Acid-Base Excess 21.2 (H) 0.0 - 2.0 mmol/L   O2 Saturation 94.3 %   Patient temperature 37.0    Collection site LEFT RADIAL    Drawn by [id number]    Sample type ARTERIAL    Allens test (pass/fail) PASS PASS    ABGS  Recent Labs  11/26/16 0500  PHART 7.447  PO2ART 65.0*  HCO3 44.2*   CULTURES Recent Results (from the past 240 hour(s))  MRSA PCR Screening     Status: None   Collection Time: 11/23/16  6:00 AM  Result Value Ref Range Status   MRSA by PCR NEGATIVE NEGATIVE Final    Comment:        The GeneXpert MRSA Assay (FDA approved for NASAL specimens only), is one component of a comprehensive MRSA colonization surveillance program. It is not intended to diagnose MRSA infection nor to guide or monitor treatment for MRSA infections.   Culture, blood (Routine X 2) w Reflex to ID Panel     Status: None (Preliminary result)   Collection Time: 11/23/16  7:38 AM  Result Value Ref Range Status   Specimen Description BLOOD RIGHT ANTECUBITAL  Final   Special Requests BOTTLES DRAWN AEROBIC AND ANAEROBIC 8CC  Final   Culture NO GROWTH 2 DAYS  Final   Report Status PENDING  Incomplete  Culture, blood (Routine X 2) w Reflex to ID Panel     Status: None (Preliminary result)   Collection Time: 11/23/16  7:45 AM  Result Value Ref Range Status   Specimen Description BLOOD LEFT ANTECUBITAL  Final   Special Requests BOTTLES DRAWN AEROBIC AND ANAEROBIC 10CC  Final   Culture NO GROWTH 2 DAYS  Final   Report Status PENDING  Incomplete   Studies/Results: Dg Chest Port 1 View  Result Date: 11/26/2016 CLINICAL DATA:  Respiratory failure EXAM: PORTABLE CHEST 1 VIEW COMPARISON:  11/25/2016 FINDINGS: Left lower lobe consolidation and left effusion unchanged. Right lung remains clear. Negative for heart failure. Apical scarring bilaterally. IMPRESSION: Left lower lobe consolidation and left effusion unchanged. Possible pneumonia. Electronically Signed    By: Franchot Gallo M.D.   On: 11/26/2016 06:52   Dg Chest Port 1 View  Result Date: 11/25/2016 CLINICAL DATA:  Hypoxemia EXAM: PORTABLE CHEST 1 VIEW COMPARISON:  11/23/2016 FINDINGS: Cardiac shadow is within normal limits. The lungs are well aerated bilaterally. Bilateral pleural effusions are noted left greater than right increased from the prior exam. Left retrocardiac atelectasis is noted. No bony abnormality is seen. IMPRESSION: Increasing left basilar atelectasis and pleural effusions bilaterally as described. Electronically Signed   By: Inez Catalina M.D.   On: 11/25/2016 07:23    Medications:  Prior to Admission:  Prescriptions Prior to Admission  Medication Sig Dispense Refill Last Dose  . albuterol (PROVENTIL HFA;VENTOLIN HFA) 108 (90 Base) MCG/ACT inhaler Inhale 2 puffs into the lungs every 6 (six) hours as needed. 1 Inhaler 1 11/22/2016 at Unknown time  . alendronate (FOSAMAX) 70 MG tablet Take 1 tablet (70 mg total) by mouth every 7 (seven) days. Take with a full glass of water on an empty stomach. 4 tablet 11   . azithromycin (ZITHROMAX) 250 MG tablet Take 1 tablet by mouth every Monday, Wednesday, and Friday.   11/22/2016 at Unknown time  . clonazePAM (KLONOPIN) 0.5 MG tablet Take 0.5 mg by mouth 2 (two) times daily.   5 11/22/2016 at  Unknown time  . Nutritional Supplements (ENSURE HIGH PROTEIN PO) Take by mouth daily.   Past Week at Unknown time  . pantoprazole (PROTONIX) 40 MG tablet TAKE 1 TABLET BY MOUTH EVERY DAY 30 tablet 5 11/22/2016 at Unknown time  . predniSONE (DELTASONE) 10 MG tablet Take 10 mg by mouth every other day.    11/22/2016 at Unknown time  . Pseudoephedrine-Guaifenesin (MUCINEX D) [phone] MG TB12 Take 1 tablet by mouth 2 (two) times daily as needed.    Past Week at Unknown time  . sertraline (ZOLOFT) 100 MG tablet Take 1 tablet by mouth daily.   11/22/2016 at Unknown time  . SPIRIVA RESPIMAT 2.5 MCG/ACT AERS Take 2 puffs by mouth daily.   11/22/2016 at Unknown  time  . SYMBICORT 160-4.5 MCG/ACT inhaler INHALE 2 PUFFS INTO THE LUNGS TWICE DAILY 10.2 g 1 11/22/2016 at Unknown time  . Vitamin D, Cholecalciferol, 1000 units CAPS Take 1,000 Units by mouth daily.   11/22/2016 at Unknown time  . XARELTO 20 MG TABS tablet Take 20 mg by mouth daily.   11/22/2016 at Unknown time   Scheduled: . ceFEPime (MAXIPIME) IV  1 g Intravenous Q12H  . feeding supplement (ENSURE ENLIVE)  237 mL Oral BID BM  . furosemide  20 mg Intravenous BID  . hydrocortisone sod succinate (SOLU-CORTEF) inj  50 mg Intravenous Q12H  . ipratropium  0.5 mg Nebulization Q6H  . levalbuterol  0.63 mg Nebulization Q6H  . mometasone-formoterol  2 puff Inhalation BID  . multivitamin with minerals  1 tablet Oral Daily  . pantoprazole  40 mg Oral Daily  . rivaroxaban  20 mg Oral Daily  . sertraline  100 mg Oral Daily  . vancomycin  750 mg Intravenous Q12H   Continuous: . amiodarone 30 mg/hr (11/26/16 0600)   XBD:[id number], LORazepam, ondansetron **OR** ondansetron (ZOFRAN) IV  Assesment:She was admitted with acute on chronic respiratory failure pneumonia COPD exacerbation atrial fib with RVR. Yesterday I thought she would likely have to be intubated and placed on mechanical ventilation but she's done okay on BiPAP. Despite that she is still high risk. She still has elevated heart rate as mentioned summer that is related to her respiratory situation and likely is not amenable to treatment. She has not been able to eat and likely does have some malnutrition particularly since she had been diagnosed with failure to thrive earlier. Principal Problem:   Atrial fibrillation with rapid ventricular response (HCC) Active Problems:   COPD (chronic obstructive pulmonary disease) (HCC)   Hyperlipidemia   Acute on chronic respiratory failure ([place])    Plan: Try her off the BiPAP see if she can eat. Placed on BiPAP based on clinical situations. I don't think repeating blood gases will help [place] unless  she becomes somnolent. She is still high risk to need intubation and mechanical ventilation. Not really a candidate for tube feedings at this point because of the potential for need for BiPAP.    LOS: 3 days   Shannon Jackson L 11/26/2016, 7:39 AM

## 2016-11-26 NOTE — Progress Notes (Signed)
PROGRESS NOTE    Shannon Jackson  WUJ:[id number] DOB: [birth year] DOA: 11/22/2016 PCP: Frederica KusterMILLER, STEPHEN M, MD    Brief Narrative: Shannon Jackson  is a 70 y.o. female, With history of atrial fibrillation on Xarelto, COPD, GERD, PE who came to the ED with generalized weakness for past 2 days. Patient found to be in A fib RVR. [facility].  Presents with A fib RVR, Health care associated PNA. Treating PNA with broad spectrum antibiotics, currently on IV amiodarone for A fib. Patient today more dyspnea, tachypnea. She will be started on BIPAP, IV lasix ordered and Pulmonology consulted.   Assessment & Plan:   Principal Problem:   Atrial fibrillation with rapid ventricular response (HCC) Active Problems:   COPD (chronic obstructive pulmonary disease) (HCC)   Hyperlipidemia   Acute on chronic respiratory failure (HCC)  1-A fib RVR;  Started on amiodarone, digoxin. Nursing states patient has a hard time keeping IV access and requested Picc line. Will place order. Cardizem caused hypotension Cardiology on board and assisting. On xarelto.  Hr still elevated.   2-Acute hypoxic Respiratory Failure: Multifactorial secondary to COPD, A fib, PNA. Pleural effusion  Continue IV lasix , IV antibiotics.  BIPAP for increase work of breathing.  Ativan PRN for anxiety, to help patient to tolerates  BIPAP  Pulmonary consulted and assisting.  Continue nebulizer treatments.   3-Health care associated PNA;  Chest x ray with infiltrates, patient presents with leukocytosis.  Follow blood cultures.  Continue with  Vancomycin and cefepime day 3.  WBC trending down.   3-Leukocytosis; treating for PNA. Trending down.   4-Hyponatremia; received IV fluids.  Started on IV lasix by cardiology.  Check urine sodium, urine osmolality results pending   5-COPD, on nebulizer.   GU; patient relate she had foley catheter at [facility]. She doesn't feel urge to urinate.  Had foley catheter  placement.  She will need voiding trial.   Anxiety; change klonopin to ativan IV PRN.    DVT prophylaxis: on xarelto Code Status: full code.  Family Communication: care discussed with patient.  Disposition Plan: Remain in the step down.    Consultants:   Cardiology    Procedures: none   Antimicrobials:  Vancomycin 12-19  Cefepime 12-19   Subjective: She reports no new complaints.   Objective: Vitals:   11/26/16 0736 11/26/16 0737 11/26/16 0744 11/26/16 0900  BP:  109/69  103/74  Pulse:  88  90  Resp:  (!) 28  (!) 26  Temp: 97.9 F (36.6 C)     TempSrc: Axillary     SpO2:  98% 98% 96%  Weight:      Height:        Intake/Output Summary (Last 24 hours) at 11/26/16 0931 Last data filed at 11/26/16 0600  Gross per 24 hour  Intake            316.7 ml  Output             3150 ml  Net          -2833.3 ml   Filed Weights   11/23/16 0612 11/24/16 0500 11/26/16 0515  Weight: 56 kg (123 lb 7.3 oz) 58 kg (127 lb 13.9 oz) 60.6 kg (133 lb 9.6 oz)    Examination:  General exam: mild distress, tachypnea.  Respiratory system: no wheezing, crackles bases, equal chest rise. Cardiovascular system: S1 & S2 heard, IRR. No JVD, murmurs, rubs, gallops or clicks. No pedal edema. Gastrointestinal  system: Abdomen is nondistended, soft and nontender. No organomegaly or masses felt. Normal bowel sounds heard. Central nervous system: Alert and awake. No facial asymmetry Extremities: Symmetric  power. Skin: No rashes, lesions or ulcers, on limited exam  Data Reviewed: I have personally reviewed following labs and imaging studies  CBC:  Recent Labs Lab 11/23/16 0105 11/23/16 0742 11/24/16 0428 11/25/16 0454 11/25/16 1752  WBC 18.7* 16.0* 14.7* 13.8* 13.5*  NEUTROABS 17.1*  --   --   --   --   HGB 11.9* 10.8* 10.7* 10.1* 9.6*  HCT 36.5 34.4* 33.1* 31.3* 29.7*  MCV 97.3 98.9 99.4 99.1 99.0  PLT 139* 122* 118* 109* 122*   Basic Metabolic Panel:  Recent Labs Lab  11/23/16 0105 11/23/16 0742 11/24/16 0428 11/25/16 0454 11/26/16 0459  NA 130* 135 130* 128* 128*  K 4.1 3.8 4.0 3.9 3.5  CL 85* 93* 90* 85* 77*  CO2 40* 39* 36* 37* 45*  GLUCOSE 104* 106* 117* 131* 90  BUN 17 16 17 14 13   CREATININE 0.32* 0.33* 0.34* 0.32* 0.33*  CALCIUM 7.8* 7.2* 7.9* 7.5* 7.1*  MG  --   --   --  1.7  --    GFR: Estimated Creatinine Clearance: 60.4 mL/min (by C-G formula based on SCr of 0.33 mg/dL (L)). Liver Function Tests:  Recent Labs Lab 11/23/16 0105 11/24/16 0428  AST 26 21  ALT 58* 43  ALKPHOS 56 53  BILITOT 1.1 0.6  PROT 5.2* 4.6*  ALBUMIN 3.2* 2.6*   No results for input(s): LIPASE, AMYLASE in the last 168 hours. No results for input(s): AMMONIA in the last 168 hours. Coagulation Profile: No results for input(s): INR, PROTIME in the last 168 hours. Cardiac Enzymes:  Recent Labs Lab 11/23/16 0105  TROPONINI 0.03*   BNP (last 3 results) No results for input(s): PROBNP in the last 8760 hours. HbA1C: No results for input(s): HGBA1C in the last 72 hours. CBG: No results for input(s): GLUCAP in the last 168 hours. Lipid Profile: No results for input(s): CHOL, HDL, LDLCALC, TRIG, CHOLHDL, LDLDIRECT in the last 72 hours. Thyroid Function Tests: No results for input(s): TSH, T4TOTAL, FREET4, T3FREE, THYROIDAB in the last 72 hours. Anemia Panel: No results for input(s): VITAMINB12, FOLATE, FERRITIN, TIBC, IRON, RETICCTPCT in the last 72 hours. Sepsis Labs: No results for input(s): PROCALCITON, LATICACIDVEN in the last 168 hours.  Recent Results (from the past 240 hour(s))  MRSA PCR Screening     Status: None   Collection Time: 11/23/16  6:00 AM  Result Value Ref Range Status   MRSA by PCR NEGATIVE NEGATIVE Final    Comment:        The GeneXpert MRSA Assay (FDA approved for NASAL specimens only), is one component of a comprehensive MRSA colonization surveillance program. It is not intended to diagnose MRSA infection nor to guide  or monitor treatment for MRSA infections.   Culture, blood (Routine X 2) w Reflex to ID Panel     Status: None (Preliminary result)   Collection Time: 11/23/16  7:38 AM  Result Value Ref Range Status   Specimen Description BLOOD RIGHT ANTECUBITAL  Final   Special Requests BOTTLES DRAWN AEROBIC AND ANAEROBIC 8CC  Final   Culture NO GROWTH 3 DAYS  Final   Report Status PENDING  Incomplete  Culture, blood (Routine X 2) w Reflex to ID Panel     Status: None (Preliminary result)   Collection Time: 11/23/16  7:45 AM  Result Value Ref  Range Status   Specimen Description BLOOD LEFT ANTECUBITAL  Final   Special Requests BOTTLES DRAWN AEROBIC AND ANAEROBIC 10CC  Final   Culture NO GROWTH 3 DAYS  Final   Report Status PENDING  Incomplete         Radiology Studies: Dg Chest Port 1 View  Result Date: 11/26/2016 CLINICAL DATA:  Respiratory failure EXAM: PORTABLE CHEST 1 VIEW COMPARISON:  11/25/2016 FINDINGS: Left lower lobe consolidation and left effusion unchanged. Right lung remains clear. Negative for heart failure. Apical scarring bilaterally. IMPRESSION: Left lower lobe consolidation and left effusion unchanged. Possible pneumonia. Electronically Signed   By: Marlan Palauharles  Clark M.D.   On: 11/26/2016 06:52   Dg Chest Port 1 View  Result Date: 11/25/2016 CLINICAL DATA:  Hypoxemia EXAM: PORTABLE CHEST 1 VIEW COMPARISON:  11/23/2016 FINDINGS: Cardiac shadow is within normal limits. The lungs are well aerated bilaterally. Bilateral pleural effusions are noted left greater than right increased from the prior exam. Left retrocardiac atelectasis is noted. No bony abnormality is seen. IMPRESSION: Increasing left basilar atelectasis and pleural effusions bilaterally as described. Electronically Signed   By: Alcide CleverMark  Lukens M.D.   On: 11/25/2016 07:23        Scheduled Meds: . ceFEPime (MAXIPIME) IV  1 g Intravenous Q12H  . feeding supplement (ENSURE ENLIVE)  237 mL Oral BID BM  . furosemide  20 mg  Intravenous BID  . hydrocortisone sod succinate (SOLU-CORTEF) inj  50 mg Intravenous Q12H  . ipratropium  0.5 mg Nebulization Q6H  . levalbuterol  0.63 mg Nebulization Q6H  . mometasone-formoterol  2 puff Inhalation BID  . multivitamin with minerals  1 tablet Oral Daily  . pantoprazole  40 mg Oral Daily  . rivaroxaban  20 mg Oral Daily  . sertraline  100 mg Oral Daily  . vancomycin  750 mg Intravenous Q12H   Continuous Infusions: . amiodarone 30 mg/hr (11/26/16 0809)     LOS: 3 days    Time spent: 35 minutes.   Penny PiaVEGA, TRUE [place], MD [facility] Pager [phone]  If 7PM-7AM, please contact night-coverage [email or web address] Password [email or web address] 11/26/2016, 9:31 AM

## 2016-11-26 NOTE — Progress Notes (Signed)
**Note De-Identified Audrie Kuri Obfuscation** Patient removed from BIPAP to 3L [place]; tolerating well.  RRT to continue to monitor.

## 2016-11-26 NOTE — Progress Notes (Addendum)
Patient Name: Shannon GoldMary Jackson Date of Encounter: 11/26/2016  Primary Cardiologist: Prentice DockerKoneswaran, Suresh MD  Hospital Problem List     Principal Problem:   Atrial fibrillation with rapid ventricular response [facility](HCC) Active Problems:   COPD (chronic obstructive pulmonary disease) (HCC)   Hyperlipidemia   Acute on chronic respiratory failure (HCC)    Subjective   Off BiPAP this morning. More short of breath.  Inpatient Medications    Scheduled Meds: . ceFEPime (MAXIPIME) IV  1 g Intravenous Q12H  . feeding supplement (ENSURE ENLIVE)  237 mL Oral BID BM  . furosemide  20 mg Intravenous BID  . hydrocortisone sod succinate (SOLU-CORTEF) inj  50 mg Intravenous Q12H  . ipratropium  0.5 mg Nebulization Q6H  . levalbuterol  0.63 mg Nebulization Q6H  . mometasone-formoterol  2 puff Inhalation BID  . multivitamin with minerals  1 tablet Oral Daily  . pantoprazole  40 mg Oral Daily  . rivaroxaban  20 mg Oral Daily  . sertraline  100 mg Oral Daily  . vancomycin  750 mg Intravenous Q12H   Continuous Infusions: . amiodarone 30 mg/hr (11/26/16 0809)   PRN Meds: levalbuterol, LORazepam, ondansetron **OR** ondansetron (ZOFRAN) IV   Vital Signs    Vitals:   11/26/16 0736 11/26/16 0737 11/26/16 0744 11/26/16 0900  BP:  109/69  103/74  Pulse:  88  90  Resp:  (!) 28  (!) 26  Temp: 97.9 F (36.6 C)     TempSrc: Axillary     SpO2:  98% 98% 96%  Weight:      Height:        Intake/Output Summary (Last 24 hours) at 11/26/16 0912 Last data filed at 11/26/16 0600  Gross per 24 hour  Intake            316.7 ml  Output             3150 ml  Net          -2833.3 ml   Filed Weights   11/23/16 0612 11/24/16 0500 11/26/16 0515  Weight: 123 lb 7.3 oz (56 kg) 127 lb 13.9 oz (58 kg) 133 lb 9.6 oz (60.6 kg)    Physical Exam    GEN: Frail, somnolent. Neck: Supple, no JVD. Cardiac: IRRR, tachycardic, no gallops.  Respiratory:  Tachypnea, labored.  Absent in the bases, bilaterally, poor  inspiration. GI: Soft, nontender, nondistended, BS + x 4. MS: No deformity or atrophy. Upper arm edema, with mild LEE.   Labs    CBC  Recent Labs  11/25/16 0454 11/25/16 1752  WBC 13.8* 13.5*  HGB 10.1* 9.6*  HCT 31.3* 29.7*  MCV 99.1 99.0  PLT 109* 122*   Basic Metabolic Panel  Recent Labs  11/25/16 0454 11/26/16 0459  NA 128* 128*  K 3.9 3.5  CL 85* 77*  CO2 37* 45*  GLUCOSE 131* 90  BUN 14 13  CREATININE 0.32* 0.33*  CALCIUM 7.5* 7.1*  MG 1.7  --    Liver Function Tests  Recent Labs  11/24/16 0428  AST 21  ALT 43  ALKPHOS 53  BILITOT 0.6  PROT 4.6*  ALBUMIN 2.6*    Telemetry    Atrial fibrillation with RVR, some episodes of sinus rhythm noted.  Radiology    Dg Chest Port 1 View  Result Date: 11/26/2016 CLINICAL DATA:  Respiratory failure EXAM: PORTABLE CHEST 1 VIEW COMPARISON:  11/25/2016 FINDINGS: Left lower lobe consolidation and left effusion unchanged. Right lung remains clear. Negative  for heart failure. Apical scarring bilaterally. IMPRESSION: Left lower lobe consolidation and left effusion unchanged. Possible pneumonia. Electronically Signed   By: Marlan Palauharles  Clark M.D.   On: 11/26/2016 06:52   Dg Chest Port 1 View  Result Date: 11/25/2016 CLINICAL DATA:  Hypoxemia EXAM: PORTABLE CHEST 1 VIEW COMPARISON:  11/23/2016 FINDINGS: Cardiac shadow is within normal limits. The lungs are well aerated bilaterally. Bilateral pleural effusions are noted left greater than right increased from the prior exam. Left retrocardiac atelectasis is noted. No bony abnormality is seen. IMPRESSION: Increasing left basilar atelectasis and pleural effusions bilaterally as described. Electronically Signed   By: Alcide CleverMark  Lukens M.D.   On: 11/25/2016 07:23    Cardiac Studies   Echocardiogram 11/24/2016 Left ventricle: The cavity size was normal. Wall thickness was   increased in a pattern of mild LVH. Systolic function was normal.   The estimated ejection fraction was 60%.  Wall motion was normal;   there were no regional wall motion abnormalities. Left   ventricular diastolic function parameters were normal. - Right ventricle: The cavity size was mildly dilated. - Right atrium: The atrium was mildly dilated. - Tricuspid valve: There was moderate regurgitation. - Pulmonary arteries: PA peak pressure: 60 mm Hg (S).  Patient Profile     Ms. Shannon Jackson is a 70 y.o.female with known history of PAF, COPD, GERD, PE and failure to thrive, admitted with atrial fibrillation with RVR, demand ischemia, in the setting of respiratory failure with COPD and pneumonia, She remains on Xarelto. Converted to NSR on IV amiodarone but back in atrial fibrillation. Echocardiogram shows normal LVEF 60%, pulmonary hypertension. CXR with bilateral pulmonary effusions.   Assessment & Plan    1. Atrial fibrillation with RVR: Continues on amiodarone at 30 mg/hr. Rhythm control is difficult with tenuous pulmonary status. Not on CCB due to intermittent hypotension.    2. COPD: Very tenuous with associated hypoxic/hypercarbic respiratory failure. Continues on antibiotics. Pulmonary consultation in place. CXR demonstrates worsening pulmonary effusions. Starting on low dose IV lasix to assist. She has diuresed  2,833 liters overnight, and is negative 1.7 liters since admission BP is soft so will monitor closely. May need BiPAP replaced.  3. Pneumonia: Treatment per Hospitalist service.    Signed, Joni ReiningKathryn Lawrence, NP 11/26/2016  Attending note:  Patient seen and examined. She remains tenuous with acute on chronic hypoxic/hypercarbic respiratory failure in the setting of severe COPD and suspected pneumonia. She did tolerate BiPAP, has been seen by Dr. Juanetta GoslingHawkins. Taken off BiPAP this morning for a trial. Continues to have rapid atrial fibrillation, although has had some episodes of sinus rhythm intermittently. Plan is to continue IV amiodarone, holding off calcium channel blocker with intermittent  hypotension. She remains on Xarelto for stroke prophylaxis. Also on IV Lasix for management of associated bilateral pleural effusions and diuresed nearly 3 L last 24 hours.  Jonelle SidleSamuel G. Jasminne Mealy, M.D., F.A.C.C.

## 2016-11-27 ENCOUNTER — Inpatient Hospital Stay (HOSPITAL_COMMUNITY): Payer: Medicare Other

## 2016-11-27 DIAGNOSIS — R0902 Hypoxemia: Secondary | ICD-10-CM

## 2016-11-27 DIAGNOSIS — J439 Emphysema, unspecified: Secondary | ICD-10-CM

## 2016-11-27 LAB — POCT I-STAT 3, ART BLOOD GAS (G3+)
ACID-BASE EXCESS: 21 mmol/L — AB (ref 0.0–2.0)
BICARBONATE: 48 mmol/L — AB (ref 20.0–28.0)
O2 SAT: 97 %
pCO2 arterial: 65.9 mmHg (ref 32.0–48.0)
pH, Arterial: 7.469 — ABNORMAL HIGH (ref 7.350–7.450)
pO2, Arterial: 84 mmHg (ref 83.0–108.0)

## 2016-11-27 LAB — CBC
HEMATOCRIT: 28.5 % — AB (ref 36.0–46.0)
HEMOGLOBIN: 9.4 g/dL — AB (ref 12.0–15.0)
MCH: 32.2 pg (ref 26.0–34.0)
MCHC: 33 g/dL (ref 30.0–36.0)
MCV: 97.6 fL (ref 78.0–100.0)
PLATELETS: 135 10*3/uL — AB (ref 150–400)
RBC: 2.92 MIL/uL — AB (ref 3.87–5.11)
RDW: 12.3 % (ref 11.5–15.5)
WBC: 10.5 10*3/uL (ref 4.0–10.5)

## 2016-11-27 LAB — AMMONIA: AMMONIA: 51 umol/L — AB (ref 9–35)

## 2016-11-27 LAB — LACTIC ACID, PLASMA: Lactic Acid, Venous: 0.8 mmol/L (ref 0.5–1.9)

## 2016-11-27 LAB — TROPONIN I: Troponin I: <0.03 ng/mL (ref <0.03)

## 2016-11-27 LAB — BLOOD GAS, ARTERIAL
ACID-BASE EXCESS: 20.1 mmol/L — AB (ref 0.0–2.0)
Bicarbonate: 43 mmol/L — ABNORMAL HIGH (ref 20.0–28.0)
DRAWN BY: 105551
FIO2: 35
Mode: POSITIVE
O2 Saturation: 94.3 %
RATE: 12 resp/min
pCO2 arterial: 73.1 mmHg (ref 32.0–48.0)
pH, Arterial: 7.416 (ref 7.350–7.450)
pO2, Arterial: 67.5 mmHg — ABNORMAL LOW (ref 83.0–108.0)

## 2016-11-27 LAB — MAGNESIUM: Magnesium: 1.5 mg/dL — ABNORMAL LOW (ref 1.7–2.4)

## 2016-11-27 LAB — MRSA PCR SCREENING: MRSA by PCR: NEGATIVE

## 2016-11-27 LAB — CORTISOL: CORTISOL PLASMA: 31.7 ug/dL

## 2016-11-27 MED ORDER — PHENYLEPHRINE HCL 10 MG/ML IJ SOLN
INTRAMUSCULAR | Status: AC
  Filled 2016-11-27: qty 1

## 2016-11-27 MED ORDER — SODIUM CHLORIDE 0.9% FLUSH
10.0000 mL | Freq: Two times a day (BID) | INTRAVENOUS | Status: DC
Start: 2016-11-27 — End: 2016-12-06
  Administered 2016-11-29: 10 mL
  Administered 2016-11-29: 30 mL
  Administered 2016-11-30 – 2016-12-02 (×6): 10 mL
  Administered 2016-12-03: 30 mL
  Administered 2016-12-05: 10 mL

## 2016-11-27 MED ORDER — PHENYLEPHRINE HCL 10 MG/ML IJ SOLN
0.0000 ug/min | INTRAVENOUS | Status: DC
  Filled 2016-11-27: qty 1

## 2016-11-27 MED ORDER — MAGNESIUM SULFATE 2 GM/50ML IV SOLN
2.0000 g | Freq: Once | INTRAVENOUS | Status: AC
  Administered 2016-11-27: 2 g via INTRAVENOUS
  Filled 2016-11-27: qty 50

## 2016-11-27 MED ORDER — SODIUM CHLORIDE 0.9 % IV BOLUS (SEPSIS): 500.0000 mL | Freq: Once | INTRAVENOUS | Status: DC

## 2016-11-27 MED ORDER — LACTULOSE ENEMA
300.0000 mL | Freq: Once | ORAL | Status: AC
  Administered 2016-11-27: 300 mL via RECTAL
  Filled 2016-11-27: qty 300

## 2016-11-27 MED ORDER — SODIUM CHLORIDE 0.9 % IV BOLUS (SEPSIS)
250.0000 mL | Freq: Once | INTRAVENOUS | Status: AC
  Administered 2016-11-27: 250 mL via INTRAVENOUS

## 2016-11-27 MED ORDER — SODIUM CHLORIDE 0.9 % IV BOLUS (SEPSIS)
1000.0000 mL | Freq: Once | INTRAVENOUS | Status: AC
  Administered 2016-11-27: 1000 mL via INTRAVENOUS

## 2016-11-27 MED ORDER — ALBUMIN HUMAN 25 % IV SOLN
25.0000 g | Freq: Once | INTRAVENOUS | Status: AC
  Administered 2016-11-27: 25 g via INTRAVENOUS
  Filled 2016-11-27: qty 100

## 2016-11-27 MED ORDER — FUROSEMIDE 10 MG/ML IJ SOLN
20.0000 mg | Freq: Every day | INTRAMUSCULAR | Status: DC
  Administered 2016-11-28: 20 mg via INTRAVENOUS
  Filled 2016-11-27: qty 2

## 2016-11-27 MED ORDER — SODIUM CHLORIDE 0.9 % IV SOLN: INTRAVENOUS | Status: DC | PRN

## 2016-11-27 MED ORDER — ALBUMIN HUMAN 25 % IV SOLN
INTRAVENOUS | Status: AC
  Filled 2016-11-27: qty 50

## 2016-11-27 MED ORDER — SODIUM CHLORIDE 0.9% FLUSH
10.0000 mL | INTRAVENOUS | Status: DC | PRN
  Administered 2016-11-28 – 2016-12-04 (×3): 10 mL
  Administered 2016-12-06: 30 mL
  Filled 2016-11-27 (×4): qty 40

## 2016-11-27 MED ORDER — HYDROCORTISONE NA SUCCINATE PF 100 MG IJ SOLR
50.0000 mg | Freq: Four times a day (QID) | INTRAMUSCULAR | Status: AC
  Administered 2016-11-27 – 2016-11-28 (×6): 50 mg via INTRAVENOUS
  Filled 2016-11-27 (×6): qty 2

## 2016-11-27 MED ORDER — SODIUM CHLORIDE 0.9 % IV BOLUS (SEPSIS): 250.0000 mL | Freq: Once | INTRAVENOUS | Status: DC

## 2016-11-27 NOTE — Progress Notes (Signed)
Critical ABG results given to Canary BrimBrandi Ollis, NP at 12:45.

## 2016-11-27 NOTE — Progress Notes (Addendum)
Unable to insert a-line at this time. 4 attempts have been made

## 2016-11-27 NOTE — Progress Notes (Signed)
Patient on BIPAP abg in a few minutes.

## 2016-11-27 NOTE — Progress Notes (Signed)
Report given to Emer RN @ [facility].Norm ParcelShannon Rashawnda Gaba, RN. 11/27/2016 10:50 AM

## 2016-11-27 NOTE — Progress Notes (Signed)
PROGRESS NOTE    Shannon Jackson  VHQ:[id number] DOB: [birth year] DOA: 11/22/2016 PCP: Frederica KusterMILLER, STEPHEN M, MD    Brief Narrative:  70 y/o with history of atrial fibrillation on xarelto, copd, GERD, PE who presented with Atrial fibrillation with RVR. Currently on amio drip. A line inserted for BP monitoring.   Assessment & Plan:   Principal Problem:   Atrial fibrillation with rapid ventricular response (HCC) - Continue current medical management. No Cardiologist over weekend. Given complicated principle problem [facility] so that Cardiology can continue to assist with care.  - transfer to step down unit  Active Problems:   COPD (chronic obstructive pulmonary disease) (HCC) - Stable, nebs prn, bipap as needed    Hyperlipidemia    Acute on chronic respiratory failure (HCC) - continue supplemental oxygen as needed, Bipap prn, improving with improvement in heart rate control - started on antibiotics most likely for left lower lobe consolidation. Will continue for now.  DVT prophylaxis: Xarelto Code Status: Full Family Communication: d/c family member at bedside Disposition Plan: pending improvement in condition   Consultants:   Contacted Cardiology: nursing to contact Cardiology group once [facility]   Procedures: A line inserted.   Antimicrobials: Vancomycin and Cefepime   Subjective: No new complaints reported. Pt resting  Objective: Vitals:   11/27/16 0418 11/27/16 0700 11/27/16 0730 11/27/16 0800  BP:  (!) 80/49    Pulse:  (!) 118 80 (!) 118  Resp:  16 16 (!) 2  Temp: 98.1 F (36.7 C)  98.3 F (36.8 C)   TempSrc: Axillary  Axillary   SpO2:  95% 97% 97%  Weight:      Height:        Intake/Output Summary (Last 24 hours) at 11/27/16 0853 Last data filed at 11/27/16 0420  Gross per 24 hour  Intake           433.81 ml  Output             1400 ml  Net          -966.19 ml   Filed Weights   11/23/16 0612 11/24/16 0500 11/26/16  0515  Weight: 56 kg (123 lb 7.3 oz) 58 kg (127 lb 13.9 oz) 60.6 kg (133 lb 9.6 oz)    Examination:  General exam: pt resting comfortably, in nad. Head: Atraumatic normocephalic Respiratory system: Clear to auscultation. Bipap in place, no wheezes Cardiovascular system: IRR. No , murmurs, rubs, Gastrointestinal system: Abdomen is nondistended, soft and nontender. No organomegaly or masses felt. Normal bowel sounds heard. Central nervous system: No focal neurological deficits, no facial asymmetry Extremities:no cyanosis Skin: No rashes, lesions or ulcers, on limited exam.   Data Reviewed: I have personally reviewed following labs and imaging studies  CBC:  Recent Labs Lab 11/23/16 0105 11/23/16 0742 11/24/16 0428 11/25/16 0454 11/25/16 1752 11/27/16 0045  WBC 18.7* 16.0* 14.7* 13.8* 13.5* 10.5  NEUTROABS 17.1*  --   --   --   --   --   HGB 11.9* 10.8* 10.7* 10.1* 9.6* 9.4*  HCT 36.5 34.4* 33.1* 31.3* 29.7* 28.5*  MCV 97.3 98.9 99.4 99.1 99.0 97.6  PLT 139* 122* 118* 109* 122* 135*   Basic Metabolic Panel:  Recent Labs Lab 11/23/16 0105 11/23/16 0742 11/24/16 0428 11/25/16 0454 11/26/16 0459 11/27/16 0045  NA 130* 135 130* 128* 128*  --   K 4.1 3.8 4.0 3.9 3.5  --   CL 85* 93* 90* 85* 77*  --  CO2 40* 39* 36* 37* 45*  --   GLUCOSE 104* 106* 117* 131* 90  --   BUN 17 16 17 14 13   --   CREATININE 0.32* 0.33* 0.34* 0.32* 0.33*  --   CALCIUM 7.8* 7.2* 7.9* 7.5* 7.1*  --   MG  --   --   --  1.7  --  1.5*   GFR: Estimated Creatinine Clearance: 60.4 mL/min (by C-G formula based on SCr of 0.33 mg/dL (L)). Liver Function Tests:  Recent Labs Lab 11/23/16 0105 11/24/16 0428  AST 26 21  ALT 58* 43  ALKPHOS 56 53  BILITOT 1.1 0.6  PROT 5.2* 4.6*  ALBUMIN 3.2* 2.6*   No results for input(s): LIPASE, AMYLASE in the last 168 hours.  Recent Labs Lab 11/27/16 0045  AMMONIA 51*   Coagulation Profile: No results for input(s): INR, PROTIME in the last 168  hours. Cardiac Enzymes:  Recent Labs Lab 11/23/16 0105 11/27/16 0045  TROPONINI 0.03* <0.03   BNP (last 3 results) No results for input(s): PROBNP in the last 8760 hours. HbA1C: No results for input(s): HGBA1C in the last 72 hours. CBG: No results for input(s): GLUCAP in the last 168 hours. Lipid Profile: No results for input(s): CHOL, HDL, LDLCALC, TRIG, CHOLHDL, LDLDIRECT in the last 72 hours. Thyroid Function Tests: No results for input(s): TSH, T4TOTAL, FREET4, T3FREE, THYROIDAB in the last 72 hours. Anemia Panel: No results for input(s): VITAMINB12, FOLATE, FERRITIN, TIBC, IRON, RETICCTPCT in the last 72 hours. Sepsis Labs:  Recent Labs Lab 11/27/16 0045  LATICACIDVEN 0.8    Recent Results (from the past 240 hour(s))  MRSA PCR Screening     Status: None   Collection Time: 11/23/16  6:00 AM  Result Value Ref Range Status   MRSA by PCR NEGATIVE NEGATIVE Final    Comment:        The GeneXpert MRSA Assay (FDA approved for NASAL specimens only), is one component of a comprehensive MRSA colonization surveillance program. It is not intended to diagnose MRSA infection nor to guide or monitor treatment for MRSA infections.   Culture, blood (Routine X 2) w Reflex to ID Panel     Status: None (Preliminary result)   Collection Time: 11/23/16  7:38 AM  Result Value Ref Range Status   Specimen Description BLOOD RIGHT ANTECUBITAL  Final   Special Requests BOTTLES DRAWN AEROBIC AND ANAEROBIC 8CC  Final   Culture NO GROWTH 4 DAYS  Final   Report Status PENDING  Incomplete  Culture, blood (Routine X 2) w Reflex to ID Panel     Status: None (Preliminary result)   Collection Time: 11/23/16  7:45 AM  Result Value Ref Range Status   Specimen Description BLOOD LEFT ANTECUBITAL  Final   Special Requests BOTTLES DRAWN AEROBIC AND ANAEROBIC 10CC  Final   Culture NO GROWTH 4 DAYS  Final   Report Status PENDING  Incomplete         Radiology Studies: Dg Chest Port 1  View  Result Date: 11/27/2016 CLINICAL DATA:  Hypoxia. EXAM: PORTABLE CHEST 1 VIEW COMPARISON:  11/26/2016 FINDINGS: Basilar consolidation and effusions persist bilaterally, worsened on the right. Apices are obscured by kyphosis. Right upper extremity PICC line probably reaches the right atrium. IMPRESSION: Consolidation and effusions in both bases, worsened on the right. Electronically Signed   By: Ellery Plunk M.D.   On: 11/27/2016 00:56   Dg Chest Port 1 View  Result Date: 11/26/2016 CLINICAL DATA:  Respiratory failure  EXAM: PORTABLE CHEST 1 VIEW COMPARISON:  11/25/2016 FINDINGS: Left lower lobe consolidation and left effusion unchanged. Right lung remains clear. Negative for heart failure. Apical scarring bilaterally. IMPRESSION: Left lower lobe consolidation and left effusion unchanged. Possible pneumonia. Electronically Signed   By: Marlan Palauharles  Clark M.D.   On: 11/26/2016 06:52        Scheduled Meds: . ceFEPime (MAXIPIME) IV  1 g Intravenous Q12H  . feeding supplement (ENSURE ENLIVE)  237 mL Oral BID BM  . furosemide  20 mg Intravenous BID  . hydrocortisone sod succinate (SOLU-CORTEF) inj  50 mg Intravenous Q6H  . ipratropium  0.5 mg Nebulization Q6H  . levalbuterol  0.63 mg Nebulization Q6H  . mometasone-formoterol  2 puff Inhalation BID  . multivitamin with minerals  1 tablet Oral Daily  . pantoprazole  40 mg Oral Daily  . rivaroxaban  20 mg Oral Daily  . sertraline  100 mg Oral Daily  . sodium chloride  1,000 mL Intravenous Once  . vancomycin  750 mg Intravenous Q12H   Continuous Infusions: . amiodarone 30 mg/hr (11/27/16 0337)  . phenylephrine (NEO-SYNEPHRINE) Adult infusion Stopped (11/27/16 0315)     LOS: 4 days   Time spent: > 35 minutes  Penny PiaVEGA, Jeane Cashatt, MD [facility] Pager [phone]  If 7PM-7AM, please contact night-coverage [email or web address] Password [email or web address] 11/27/2016, 8:53 AM

## 2016-11-27 NOTE — Progress Notes (Signed)
Art catheter inserted good wave form and blood return. Wrist has bruising from earlier attempts, and art draws. Able to feel pulses on hand.

## 2016-11-27 NOTE — Progress Notes (Signed)
She is being transferred [facility]. I will sign off. Thanks for allowing me to see her with you

## 2016-11-27 NOTE — Progress Notes (Signed)
Subjective:  Chronic shortness of breath, no chest pain  Objective:  Temp:  [97.3 F (36.3 C)-98.3 F (36.8 C)] 97.8 F (36.6 C) (12/23 1215) Pulse Rate:  [76-139] 130 (12/23 1209) Resp:  [16-32] 32 (12/23 1209) BP: (76-123)/(46-69) 109/59 (12/23 1209) SpO2:  [77 %-100 %] 94 % (12/23 1209) Arterial Line BP: (88-106)/(55-75) 88/70 (12/23 1027) FiO2 (%):  [40 %-50 %] 40 % (12/23 1215) Weight:  [134 lb 14.7 oz (61.2 kg)] 134 lb 14.7 oz (61.2 kg) (12/23 1215) Weight change:   Intake/Output from previous day: 12/22 0701 - 12/23 0700 In: 433.8 [I.V.:233.8; IV Piggyback:200] Out: 1400 [Urine:1400]  Intake/Output from this shift: Total I/O In: 500 [IV Piggyback:500] Out: 200 [Urine:200]  Physical Exam: General appearance: alert and no distress Neck: no adenopathy, no carotid bruit, no JVD, supple, symmetrical, trachea midline and thyroid not enlarged, symmetric, no tenderness/mass/nodules Lungs: clear to auscultation bilaterally Heart: irregularly irregular rhythm Extremities: extremities normal, atraumatic, no cyanosis or edema  Lab Results: Results for orders placed or performed during the hospital encounter of 11/22/16 (from the past 48 hour(s))  Vancomycin, trough     Status: Abnormal   Collection Time: 11/25/16  5:52 PM  Result Value Ref Range   Vancomycin Tr 10 (L) 15 - 20 ug/mL  CBC     Status: Abnormal   Collection Time: 11/25/16  5:52 PM  Result Value Ref Range   WBC 13.5 (H) 4.0 - 10.5 K/uL   RBC 3.00 (L) 3.87 - 5.11 MIL/uL   Hemoglobin 9.6 (L) 12.0 - 15.0 g/dL   HCT 29.7 (L) 36.0 - 46.0 %   MCV 99.0 78.0 - 100.0 fL   MCH 32.0 26.0 - 34.0 pg   MCHC 32.3 30.0 - 36.0 g/dL   RDW 12.2 11.5 - 15.5 %   Platelets 122 (L) 150 - 400 K/uL  Basic metabolic panel     Status: Abnormal   Collection Time: 11/26/16  4:59 AM  Result Value Ref Range   Sodium 128 (L) 135 - 145 mmol/L   Potassium 3.5 3.5 - 5.1 mmol/L   Chloride 77 (L) 101 - 111 mmol/L   CO2 45 (H) 22  - 32 mmol/L   Glucose, Bld 90 65 - 99 mg/dL   BUN 13 6 - 20 mg/dL   Creatinine, Ser 0.33 (L) 0.44 - 1.00 mg/dL   Calcium 7.1 (L) 8.9 - 10.3 mg/dL   GFR calc non Af Amer >60 >60 mL/min   GFR calc Af Amer >60 >60 mL/min    Comment: (NOTE) The eGFR has been calculated using the CKD EPI equation. This calculation has not been validated in all clinical situations. eGFR's persistently <60 mL/min signify possible Chronic Kidney Disease.    Anion gap 6 5 - 15  Blood gas, arterial     Status: Abnormal   Collection Time: 11/26/16  5:00 AM  Result Value Ref Range   FIO2 40.00    Delivery systems BILEVEL POSITIVE AIRWAY PRESSURE    Inspiratory PAP 16.0    Expiratory PAP 8.0    pH, Arterial 7.447 7.350 - 7.450   pCO2 arterial 69.1 (HH) 32.0 - 48.0 mmHg    Comment: CRITICAL RESULT CALLED TO, READ BACK BY AND VERIFIED WITH: PHILLIPS C RN AT [id number] 11/26/16 [place] S RRT/RCP    pO2, Arterial 65.0 (L) 83.0 - 108.0 mmHg   Bicarbonate 44.2 (H) 20.0 - 28.0 mmol/L   Acid-Base Excess 21.2 (H) 0.0 - 2.0 mmol/L   O2  Saturation 94.3 %   Patient temperature 37.0    Collection site LEFT RADIAL    Drawn by [id number]    Sample type ARTERIAL    Allens test (pass/fail) PASS PASS  Lactic acid, plasma     Status: None   Collection Time: 11/27/16 12:45 AM  Result Value Ref Range   Lactic Acid, Venous 0.8 0.5 - 1.9 mmol/L  Troponin I     Status: None   Collection Time: 11/27/16 12:45 AM  Result Value Ref Range   Troponin I <0.03 <0.03 ng/mL  CBC     Status: Abnormal   Collection Time: 11/27/16 12:45 AM  Result Value Ref Range   WBC 10.5 4.0 - 10.5 K/uL   RBC 2.92 (L) 3.87 - 5.11 MIL/uL   Hemoglobin 9.4 (L) 12.0 - 15.0 g/dL   HCT 28.5 (L) 36.0 - 46.0 %   MCV 97.6 78.0 - 100.0 fL   MCH 32.2 26.0 - 34.0 pg   MCHC 33.0 30.0 - 36.0 g/dL   RDW 12.3 11.5 - 15.5 %   Platelets 135 (L) 150 - 400 K/uL  Magnesium     Status: Abnormal   Collection Time: 11/27/16 12:45 AM  Result Value Ref Range   Magnesium 1.5  (L) 1.7 - 2.4 mg/dL  Ammonia     Status: Abnormal   Collection Time: 11/27/16 12:45 AM  Result Value Ref Range   Ammonia 51 (H) 9 - 35 umol/L  I-STAT 3, arterial blood gas (G3+)     Status: Abnormal   Collection Time: 11/27/16 12:43 PM  Result Value Ref Range   pH, Arterial 7.469 (H) 7.350 - 7.450   pCO2 arterial 65.9 (HH) 32.0 - 48.0 mmHg   pO2, Arterial 84.0 83.0 - 108.0 mmHg   Bicarbonate 48.0 (H) 20.0 - 28.0 mmol/L   TCO2 >50 0 - 100 mmol/L   O2 Saturation 97.0 %   Acid-Base Excess 21.0 (H) 0.0 - 2.0 mmol/L   Patient temperature 97.8 F    Collection site FEMORAL ARTERY    Drawn by RT    Sample type ARTERIAL    Comment NOTIFIED PHYSICIAN   Blood gas, arterial     Status: Abnormal   Collection Time: 11/27/16 12:55 PM  Result Value Ref Range   FIO2 35.00    Delivery systems OXYGEN MASK    Mode BILEVEL POSITIVE AIRWAY PRESSURE    LHR 12 resp/min   pH, Arterial 7.416 7.350 - 7.450   pCO2 arterial 73.1 (HH) 32.0 - 48.0 mmHg    Comment: CRITICAL RESULT CALLED TO, READ BACK BY AND VERIFIED WITH:  TO SMITH J RN AT 0100 11/27/2016 BY [facility] RRT    pO2, Arterial 67.5 (L) 83.0 - 108.0 mmHg   Bicarbonate 43.0 (H) 20.0 - 28.0 mmol/L   Acid-Base Excess 20.1 (H) 0.0 - 2.0 mmol/L   O2 Saturation 94.3 %   Collection site RADIAL    Drawn by [id number]    Sample type ARTERIAL    Allens test (pass/fail) PASS PASS    Imaging: Imaging results have been reviewed, consolidated pleural effusions at both bases Telemetry-atrial fibrillation with a ventricular response of 110  Assessment/Plan:   1. Principal Problem: 2.   Atrial fibrillation with rapid ventricular response ([place]) 3. Active Problems: 4.   COPD (chronic obstructive pulmonary disease) ([place]) 5.   Hyperlipidemia 6.   Acute on chronic respiratory failure ([place]) 7.   Time Spent Directly with Patient:  20 minutes  Length of Stay:  LOS: 4 days   Shannon Jackson to [facility] on 11/22/16. Prior to that she had  been in [place] and then at [facility]. She was transferred from [facility] today for further treatment in our stepdown unit. She has a long history of COPD. She's had no prior cardiac history. She was seen by Dr. Domenic Polite on our service and [facility] for A. fib with RVR and was placed on IV amiodarone. Her BNP was low. Her chest x-ray showed bilateral pleural effusions. She was placed on IV antibiotics and bronchodilators. She was given low-dose IV diuretics, and has a negative fluid balance of 2.4 L. A 2-D echo revealed normal LV systolic function. I do not believe she is in heart failure. I believe her A. fib is driven by her pulmonary issues. She was started on Xarelto for stroke prophylaxis. Her A. fib rate has been controlled on IV amiodarone. I believe it will be difficult to convert her to sinus rhythm until her pulmonary issues are more thoroughly treated.   Quay Burow 11/27/2016, 1:49 PM

## 2016-11-27 NOTE — Consult Note (Signed)
PULMONARY / CRITICAL CARE MEDICINE   Name: Shannon Jackson MRN: [id number] DOB: 1946/03/31    ADMISSION DATE:  11/22/2016 CONSULTATION DATE:  11/27/16  REFERRING MD:  Dr. Cena BentonVega   CHIEF COMPLAINT:  SOB, generalized weakness  HISTORY OF PRESENT ILLNESS:   70 y/o F with PMH of arthritis, osteoporosis, GERD, HLD, AF (on Xarelto), prior PE, Raynaud's syndrome, concern for failure to thrive & COPD who presented to [facility] on 12/18 with generalized weakness.    She was [facility] approximately 2 weeks prior to admit and diagnosed with atrial fibrillation and failure to thrive.  [facility] was [facility] and signed herself out AMA on 11/21/16 as she was unhappy with the care provided at [facility].  She returend home and continued to have generalized weakness, mild SOB and returned for evaluation on 12/18 and was found to be in AF with RVR.  [facility]  She was treated with IV amiodarone gtt for rate control.  There was concern for LLL consolidation and she was treated empirically for PNA.  The patient developed worsening respiratory distress and was placed on BiPAP.    [facility] 12/23.     PAST MEDICAL HISTORY :  She  has a past medical history of A-fib (HCC); Allergic rhinitis; Arthritis; Cataract; COPD (chronic obstructive pulmonary disease) (HCC); Failure to thrive in adult; GERD (gastroesophageal reflux disease); Hyperlipidemia; Osteopenia; Osteoporosis (08/2016); Pulmonary embolism (HCC); and Raynaud's syndrome.  PAST SURGICAL HISTORY: She  has a past surgical history that includes Foot neuroma surgery and Tubal ligation.  Allergies  Allergen Reactions  . Codeine Nausea And Vomiting  . Levofloxacin     IV form caused swelling in hand, itching and burning.  . Lovenox [Enoxaparin Sodium]     Per EMS    No current facility-administered medications on file prior to encounter.     Current Outpatient Prescriptions on File Prior to Encounter  Medication Sig  . albuterol (PROVENTIL HFA;VENTOLIN HFA) 108 (90 Base) MCG/ACT inhaler Inhale 2 puffs into the lungs every 6 (six) hours as needed.  Marland Kitchen. alendronate (FOSAMAX) 70 MG tablet Take 1 tablet (70 mg total) by mouth every 7 (seven) days. Take with a full glass of water on an empty stomach.  Marland Kitchen. azithromycin (ZITHROMAX) 250 MG tablet Take 1 tablet by mouth every Monday, Wednesday, and Friday.  . clonazePAM (KLONOPIN) 0.5 MG tablet Take 0.5 mg by mouth 2 (two) times daily.   . Nutritional Supplements (ENSURE HIGH PROTEIN PO) Take by mouth daily.  . pantoprazole (PROTONIX) 40 MG tablet TAKE 1 TABLET BY MOUTH EVERY DAY  . predniSONE (DELTASONE) 10 MG tablet Take 10 mg by mouth every other day.   . Pseudoephedrine-Guaifenesin (MUCINEX D) [phone] MG TB12 Take 1 tablet by mouth 2 (two) times daily as needed.   . sertraline (ZOLOFT) 100 MG tablet Take 1 tablet by mouth daily.  Marland Kitchen. SPIRIVA RESPIMAT 2.5 MCG/ACT AERS Take 2 puffs by mouth daily.  . SYMBICORT 160-4.5 MCG/ACT inhaler INHALE 2 PUFFS INTO THE LUNGS TWICE DAILY  . Vitamin D, Cholecalciferol, 1000 units CAPS Take 1,000 Units by mouth daily.  Carlena Hurl. XARELTO 20 MG TABS tablet Take 20 mg by mouth daily.    FAMILY HISTORY:  Her indicated that her mother is deceased. She indicated that her father is deceased. She indicated that all of her four sisters are alive. She indicated that all of her five brothers  are alive.    SOCIAL HISTORY: She  reports that she quit smoking about 9 years ago. Her smoking use included Cigarettes. She has a 25.00 pack-year smoking history. She has never used smokeless tobacco. She reports that she does not drink alcohol or use drugs.  REVIEW OF SYSTEMS:  Unable to complete as patient is on bipap.   SUBJECTIVE:    VITAL SIGNS: BP (!) 80/49 (BP Location: Left Arm)   Pulse 82   Temp 98.3 F (36.8 C) (Axillary)   Resp (!) 21   Ht 5\' 6"  (1.676 m)   Wt  133 lb 9.6 oz (60.6 kg)   SpO2 95%   BMI 21.56 kg/m   HEMODYNAMICS:    VENTILATOR SETTINGS: FiO2 (%):  [45 %-50 %] 45 %  INTAKE / OUTPUT: I/O last 3 completed shifts: In: 750.5 [I.V.:250.5; IV Piggyback:500] Out: 2800 [Urine:2800]  PHYSICAL EXAMINATION: General:  Elderly, frail female in NAD on bipap  Neuro:  Awake/alert, follows commands, communicates appropriately but difficult to understand while on bipap  HEENT:  BiPAP in place, MM pink/dry Cardiovascular:  s1s2 irr irr, no m/r/g Lungs:  Even/non-labored, lungs bilaterally diminished  Abdomen:  Obese/soft, bsx4 active  Musculoskeletal:  No acute deformities  Skin:  Warm/dry, 1-2+ pitting edema in BLE   LABS:  BMET  Recent Labs Lab 11/24/16 0428 11/25/16 0454 11/26/16 0459  NA 130* 128* 128*  K 4.0 3.9 3.5  CL 90* 85* 77*  CO2 36* 37* 45*  BUN 17 14 13   CREATININE 0.34* 0.32* 0.33*  GLUCOSE 117* 131* 90    Electrolytes  Recent Labs Lab 11/24/16 0428 11/25/16 0454 11/26/16 0459 11/27/16 0045  CALCIUM 7.9* 7.5* 7.1*  --   MG  --  1.7  --  1.5*    CBC  Recent Labs Lab 11/25/16 0454 11/25/16 1752 11/27/16 0045  WBC 13.8* 13.5* 10.5  HGB 10.1* 9.6* 9.4*  HCT 31.3* 29.7* 28.5*  PLT 109* 122* 135*    Coag's No results for input(s): APTT, INR in the last 168 hours.  Sepsis Markers  Recent Labs Lab 11/27/16 0045  LATICACIDVEN 0.8    ABG  Recent Labs Lab 11/25/16 1035 11/26/16 0500 11/27/16 1255  PHART 7.361 7.447 7.416  PCO2ART 71.1* 69.1* 73.1*  PO2ART 81.0* 65.0* 67.5*    Liver Enzymes  Recent Labs Lab 11/23/16 0105 11/24/16 0428  AST 26 21  ALT 58* 43  ALKPHOS 56 53  BILITOT 1.1 0.6  ALBUMIN 3.2* 2.6*    Cardiac Enzymes  Recent Labs Lab 11/23/16 0105 11/27/16 0045  TROPONINI 0.03* <0.03    Glucose No results for input(s): GLUCAP in the last 168 hours.  Imaging Dg Chest Port 1 View  Result Date: 11/27/2016 CLINICAL DATA:  Hypoxia. EXAM: PORTABLE CHEST  1 VIEW COMPARISON:  11/26/2016 FINDINGS: Basilar consolidation and effusions persist bilaterally, worsened on the right. Apices are obscured by kyphosis. Right upper extremity PICC line probably reaches the right atrium. IMPRESSION: Consolidation and effusions in both bases, worsened on the right. Electronically Signed   By: Ellery Plunk M.D.   On: 11/27/2016 00:56     STUDIES:  ECHO 12/20 >> mild LVH, LVEF 60%, RA/RV mildly dilated, moderate TR, PA peak 60 mmHg  CULTURES: BCx2 12/19 >>   ANTIBIOTICS: Maxipime 12/19 >>  Vanco 12/19 >>  SIGNIFICANT EVENTS: 12/18  Admit to [facility] for weakness, found to be in AF with RVR, ? PNA.  BiPAP 12/23  Tx to [facility] for further eval  LINES/TUBES:   DISCUSSION: 70 y/o F admitted with weakness/mild SOB.  Found to be in AF with RVR.  Tx with amio/digoxin, abx for possible PNA  ASSESSMENT / PLAN:  PULMONARY A: Acute on Chronic Hypercarbic Respiratory Failure - multifactorial in setting of AF w RVR, possible PNA, underlying obstructive lung disease Bilateral Pleural Effusions - R>L Possible PNA  COPD  P:   Continue intermittent bipap support  Wean O2 for sats 88-94% Trend CXR  Diuresis as renal function / BP allow Pulmonary hygiene  Xopenex / Atrovent Q6 with PRN xopenex  Hold Dulera with above See ID  CARDIOVASCULAR A:  Atrial Fibrillation w RVR  Likely Diastolic Dysfunction  Hx PE, HLD P:  SDU monitoring  Amiodarone gtt  Continue digoxin [facility]  Reduce Lasix to 20 mg QD Stop added for stress dose steroids    RENAL A:   Hyponatremia  P:   Trend BMP/UOP Replace electrolytes as indicated Avoid nephrotoxic agents   GASTROINTESTINAL A:   At Risk Aspiration - in setting of bipap  P:   Aspiration precautions NPO  Heart healthy diet when off bipap / alert  HEMATOLOGIC A:   Anemia - supsect of chronic disease, no evidence of acute bleeding Chronic Anticoagulation - xarelto for AF Mild  Thrombocytopenia  P:  Trend CBC Continue Xarelto for now  Monitor for bleeding  INFECTIOUS A:   R/O PNA  P:   Assess PCT  Trend CXR  Empiric abx as above, D 5/x  ENDOCRINE A:   No acute issues    P:   Monitor glucose on BMP  D/C stress steroids   NEUROLOGIC A:   Anxiety  P:   RASS goal: n/a Minimize sedating medications as able   FAMILY  - Updates:  No family available on assessment 12/23.    - Inter-disciplinary family meet or Palliative Care meeting due by:  12/23   [facility] am.  PCCM will be available PRN.    Canary BrimBrandi Darriona Dehaas, NP-C [facility] Pgr: [phone] or if no answer [phone] 11/27/2016, 12:27 PM

## 2016-11-27 NOTE — Progress Notes (Signed)
Pt taken off Bipap per Dr. Reginia NaasAlva's request following her ABG results.  Rt placed pt on nasal cannula 5 Lpm with humidity. Pt is tolerating well.

## 2016-11-27 NOTE — Progress Notes (Signed)
Bp has been running soft sbp 70's-90's. Did a manual when I got a reading of 62/50 on the monitor. 62/45 manual,  MD paged. 1L bolus given. Night MD came by to see her and ordered labs. Continued on amio gtt, gave 25g of albumin, close monitoring by myself and the doctor for hypotension. Orders to start neo-synephrine if pt can not maintain a BP above SBP 85. Ammonia levels were elevated, Pt received a 300 ml lactulose enema and magnesium supplement for low mag level. Pt also had a critical Pco2 level of 73 and started on continuous Bipap. Tolerating well. MD ordered for an A-line to be put in, consent in chart 2 nurses witnessed by telephone consent from son, Tinnie GensJeffrey.  RT attempted several times and was unable to achieve access at this time and will try again later for A-line. RN will continue to monitor,no obvious s/s of acute distress at this time.Baseline is lethargic while on my shift. Pt is resting.  Laiken Nohr Shelia MediaK Nira Visscher, RN 11/27/16 4:11 AM

## 2016-11-28 ENCOUNTER — Inpatient Hospital Stay (HOSPITAL_COMMUNITY): Payer: Medicare Other

## 2016-11-28 DIAGNOSIS — J9620 Acute and chronic respiratory failure, unspecified whether with hypoxia or hypercapnia: Secondary | ICD-10-CM

## 2016-11-28 DIAGNOSIS — E871 Hypo-osmolality and hyponatremia: Secondary | ICD-10-CM

## 2016-11-28 DIAGNOSIS — J418 Mixed simple and mucopurulent chronic bronchitis: Secondary | ICD-10-CM

## 2016-11-28 DIAGNOSIS — J9622 Acute and chronic respiratory failure with hypercapnia: Secondary | ICD-10-CM

## 2016-11-28 DIAGNOSIS — I272 Pulmonary hypertension, unspecified: Secondary | ICD-10-CM

## 2016-11-28 DIAGNOSIS — I2602 Saddle embolus of pulmonary artery with acute cor pulmonale: Secondary | ICD-10-CM

## 2016-11-28 DIAGNOSIS — J90 Pleural effusion, not elsewhere classified: Secondary | ICD-10-CM

## 2016-11-28 DIAGNOSIS — J441 Chronic obstructive pulmonary disease with (acute) exacerbation: Secondary | ICD-10-CM

## 2016-11-28 DIAGNOSIS — R410 Disorientation, unspecified: Secondary | ICD-10-CM

## 2016-11-28 LAB — BLOOD GAS, ARTERIAL
Acid-Base Excess: 20.2 mmol/L — ABNORMAL HIGH (ref 0.0–2.0)
Acid-Base Excess: 20.5 mmol/L — ABNORMAL HIGH (ref 0.0–2.0)
BICARBONATE: 46.4 mmol/L — AB (ref 20.0–28.0)
Bicarbonate: 47.8 mmol/L — ABNORMAL HIGH (ref 20.0–28.0)
DRAWN BY: 270221
Delivery systems: POSITIVE
Drawn by: 398661
Expiratory PAP: 7
FIO2: 0.4
INSPIRATORY PAP: 14
O2 Content: 3 L/min
O2 SAT: 99.3 %
O2 SAT: 98 %
PATIENT TEMPERATURE: 98.6
PCO2 ART: 96.8 mmHg — AB (ref 32.0–48.0)
PCO2 ART: 76.4 mmHg — AB (ref 32.0–48.0)
PH ART: 7.311 — AB (ref 7.350–7.450)
Patient temperature: 97.7
RATE: 10 resp/min
pH, Arterial: 7.401 (ref 7.350–7.450)
pO2, Arterial: 106 mmHg (ref 83.0–108.0)
pO2, Arterial: 169 mmHg — ABNORMAL HIGH (ref 83.0–108.0)

## 2016-11-28 LAB — COMPREHENSIVE METABOLIC PANEL
ALBUMIN: 2.6 g/dL — AB (ref 3.5–5.0)
ALT: 51 U/L (ref 14–54)
AST: 27 U/L (ref 15–41)
Alkaline Phosphatase: 61 U/L (ref 38–126)
Anion gap: 9 (ref 5–15)
BILIRUBIN TOTAL: 0.5 mg/dL (ref 0.3–1.2)
BUN: 11 mg/dL (ref 6–20)
CHLORIDE: 76 mmol/L — AB (ref 101–111)
CO2: 42 mmol/L — ABNORMAL HIGH (ref 22–32)
Calcium: 8.2 mg/dL — ABNORMAL LOW (ref 8.9–10.3)
Creatinine, Ser: 0.35 mg/dL — ABNORMAL LOW (ref 0.44–1.00)
GFR calc Af Amer: >60 mL/min (ref >60)
GFR calc non Af Amer: >60 mL/min (ref >60)
GLUCOSE: 149 mg/dL — AB (ref 65–99)
POTASSIUM: 3.2 mmol/L — AB (ref 3.5–5.1)
Sodium: 127 mmol/L — ABNORMAL LOW (ref 135–145)
Total Protein: 4.2 g/dL — ABNORMAL LOW (ref 6.5–8.1)

## 2016-11-28 LAB — CBC
HCT: 25.9 % — ABNORMAL LOW (ref 36.0–46.0)
HCT: 25.9 % — ABNORMAL LOW (ref 36.0–46.0)
HEMOGLOBIN: 8.5 g/dL — AB (ref 12.0–15.0)
Hemoglobin: 8.4 g/dL — ABNORMAL LOW (ref 12.0–15.0)
MCH: 30.8 pg (ref 26.0–34.0)
MCH: 31.1 pg (ref 26.0–34.0)
MCHC: 32.4 g/dL (ref 30.0–36.0)
MCHC: 32.8 g/dL (ref 30.0–36.0)
MCV: 94.9 fL (ref 78.0–100.0)
MCV: 94.9 fL (ref 78.0–100.0)
PLATELETS: 152 10*3/uL (ref 150–400)
PLATELETS: 151 10*3/uL (ref 150–400)
RBC: 2.73 MIL/uL — ABNORMAL LOW (ref 3.87–5.11)
RBC: 2.73 MIL/uL — AB (ref 3.87–5.11)
RDW: 12.6 % (ref 11.5–15.5)
RDW: 12.5 % (ref 11.5–15.5)
WBC: 10.1 10*3/uL (ref 4.0–10.5)
WBC: 10.6 10*3/uL — AB (ref 4.0–10.5)

## 2016-11-28 LAB — BASIC METABOLIC PANEL
Anion gap: 7 (ref 5–15)
BUN: 8 mg/dL (ref 6–20)
CALCIUM: 7.7 mg/dL — AB (ref 8.9–10.3)
CO2: 40 mmol/L — ABNORMAL HIGH (ref 22–32)
Chloride: 78 mmol/L — ABNORMAL LOW (ref 101–111)
Creatinine, Ser: 0.32 mg/dL — ABNORMAL LOW (ref 0.44–1.00)
GFR calc Af Amer: >60 mL/min (ref >60)
GLUCOSE: 206 mg/dL — AB (ref 65–99)
Potassium: 3.5 mmol/L (ref 3.5–5.1)
SODIUM: 125 mmol/L — AB (ref 135–145)

## 2016-11-28 LAB — CULTURE, BLOOD (ROUTINE X 2)
CULTURE: NO GROWTH
Culture: NO GROWTH

## 2016-11-28 LAB — AMMONIA: Ammonia: 27 umol/L (ref 9–35)

## 2016-11-28 LAB — NA AND K (SODIUM & POTASSIUM), RAND UR
POTASSIUM UR: 14 mmol/L
Sodium, Ur: <10 mmol/L

## 2016-11-28 LAB — SODIUM
SODIUM: 125 mmol/L — AB (ref 135–145)
Sodium: 123 mmol/L — ABNORMAL LOW (ref 135–145)

## 2016-11-28 LAB — OSMOLALITY: Osmolality: 271 mOsm/kg — ABNORMAL LOW (ref 275–295)

## 2016-11-28 LAB — OSMOLALITY, URINE: OSMOLALITY UR: 473 mosm/kg (ref 300–900)

## 2016-11-28 LAB — URIC ACID: Uric Acid, Serum: 1.2 mg/dL — ABNORMAL LOW (ref 2.3–6.6)

## 2016-11-28 LAB — MAGNESIUM: Magnesium: 2 mg/dL (ref 1.7–2.4)

## 2016-11-28 MED ORDER — SODIUM CHLORIDE 0.9 % IV SOLN
30.0000 meq | Freq: Two times a day (BID) | INTRAVENOUS | Status: AC
  Administered 2016-11-28 – 2016-11-29 (×2): 30 meq via INTRAVENOUS
  Filled 2016-11-28 (×2): qty 15

## 2016-11-28 MED ORDER — MORPHINE SULFATE (PF) 2 MG/ML IV SOLN
1.0000 mg | INTRAVENOUS | Status: DC | PRN
  Administered 2016-11-28: 1 mg via INTRAVENOUS
  Administered 2016-11-30 – 2016-12-02 (×8): 2 mg via INTRAVENOUS
  Filled 2016-11-28 (×9): qty 1

## 2016-11-28 MED ORDER — BUDESONIDE 0.5 MG/2ML IN SUSP: 0.5000 mg | Freq: Two times a day (BID) | RESPIRATORY_TRACT | Status: DC

## 2016-11-28 MED ORDER — METHYLPREDNISOLONE SODIUM SUCC 125 MG IJ SOLR
60.0000 mg | INTRAMUSCULAR | Status: DC
  Administered 2016-11-28: 60 mg via INTRAVENOUS
  Filled 2016-11-28: qty 2

## 2016-11-28 MED ORDER — SODIUM CHLORIDE 3 % IV SOLN
INTRAVENOUS | Status: AC
  Administered 2016-11-28: 50 mL/h via INTRAVENOUS
  Filled 2016-11-28: qty 500

## 2016-11-28 MED ORDER — FUROSEMIDE 10 MG/ML IJ SOLN
20.0000 mg | Freq: Once | INTRAMUSCULAR | Status: AC
  Administered 2016-11-28: 20 mg via INTRAVENOUS
  Filled 2016-11-28: qty 2

## 2016-11-28 MED ORDER — BUDESONIDE 0.5 MG/2ML IN SUSP
0.5000 mg | Freq: Two times a day (BID) | RESPIRATORY_TRACT | Status: DC
  Administered 2016-11-28 – 2016-12-03 (×10): 0.5 mg via RESPIRATORY_TRACT
  Filled 2016-11-28 (×10): qty 2

## 2016-11-28 MED ORDER — DIGOXIN 0.25 MG/ML IJ SOLN
0.2500 mg | Freq: Every day | INTRAMUSCULAR | Status: AC
  Administered 2016-11-28 – 2016-11-29 (×2): 0.25 mg via INTRAVENOUS
  Filled 2016-11-28 (×2): qty 2

## 2016-11-28 MED ORDER — DIGOXIN 0.25 MG/ML IJ SOLN: 0.2500 mg | Freq: Once | INTRAMUSCULAR | Status: DC

## 2016-11-28 MED ORDER — SODIUM CHLORIDE 0.9 % IV SOLN: 30.0000 meq | Freq: Once | INTRAVENOUS | Status: DC

## 2016-11-28 NOTE — Progress Notes (Signed)
PROGRESS NOTE    Shannon Jackson  ZOX:[id number] DOB: May 13, 1946 DOA: 11/22/2016 PCP: Frederica Kuster, MD   Brief Narrative:  70 y.o.WF PMHx Atrial fibrillation on Xarelto, Pulmonary Hypertension COPD, PE,Raynaud's syndrome, HLD, GERD,   Who came to the ED with generalized weakness for past 2 days. Patient was seen at [facility] couple of weeks ago and diagnosed with atrial fibrillation and failure to thrive at that time she was admitted to ICU and had several episodes of atrial fibrillation. [facility] and was supposed to go to [place] for long-term care yesterday. Patient signed herself out A[facility] from [facility] on 11/21/2016 as she was not happy with the care provided at [facility]. At home patient started feeling generalized weakness and was brought to hospital for further evaluation. She denies chest pain, mild shortness of breath. + nausea, no vomiting or diarrhea.  In the ED patient was found to be in A. fib with RVR, cardiology fellow was consulted by ED physician. Patient started on amiodarone infusion, also given diltiazem.   Subjective: 12/24 Somnolent, arousable with stimulation (A/O 1 does not know why, when, where)   Assessment & Plan:   Principal Problem:   Atrial fibrillation with rapid ventricular response (HCC) Active Problems:   COPD (chronic obstructive pulmonary disease) (HCC)   Hyperlipidemia   Acute on chronic respiratory failure (HCC)   Hypoxemia   Altered mental status -Respiratory failure? Hyponatremia? -Patient with increasing PCO2, and decreasing O2 by ABG. -Restart patient on BiPAP titrated to maintain SPO2>, PCO2<65  -Recheck ABG at 2200 -Ammonia pending  Acute on Chronic Hypercarbic Respiratory Failure -multifactorial in setting of AF w RVR, possible PNA, underlying COPD -Bilateral Pleural Effusions - R>L -Complete 7 day course of antibiotic for  HCAP  COPD Exacerbation -Continue  intermittent bipap support  -Titrate O2 for sats 88-94% -Xopenex QID -Solu-Medrol 60 mg daily -Physiotherapy vest BID: Patient unable to use flutter valve  Right pleural effusion -Increasing right pleural effusion patient currently on BiPAP -See A. fib -If no improvement will consider thoracentesis on 12/25  Atrial Fibrillation w RVR  -Currently uncontrolled rate on amiodarone drip -Currently patient's BP unlikely to tolerate additional BB, CCB -Start Digoxin 250 g daily -Hold Lasix and sees soft BP) -Strict in and out -Daily weight -Lasix 20 mg 1  Pulmonary Hypertension -See A. fib  PE, -Continue Xarelto  HLD  Hyponatremia  -Urine osmolality, urine sodium, serum osmolality, pending -Discontinued Zoloft -3% saline at 73ml/hr x 6 hours -DC 3% saline if sodium increases more than 10 mEq in 24 hours  Anemia  - supsect of chronic disease, no evidence of acute bleeding, however dropped one unit overnight. -Repeat CBC Recent Labs Lab 11/25/16 0454 11/25/16 1752 11/27/16 0045 11/28/16 0900 11/28/16 1630  HGB 10.1* 9.6* 9.4* 8.4* 8.5*  -Monitor for bleeding  Anxiety   Hypomagnesemia -Magnesium goal> 2  Hypokalemia -Potassium goal> 4 -Potassium IV 60 mEq    DVT prophylaxis: Xarelto Code Status: Partial Family Communication: Partial Disposition Plan: ?   Consultants:  Cardiology PC CM    Procedures/Significant Events:  12/20 Echocardiogram:Left ventricle: mild LVH. -LVEF=60%.- Tricuspid valve moderate regurgitation. - Pulmonary arteries: PA peak pressure: 60 mm Hg (S).   VENTILATOR SETTINGS: None   Cultures 12/19 MRSA by PCR negative 12/19 blood negative 12/24 respiratory virus panel pending    Antimicrobials:Anti-infectives    Start     Dose/Rate Stop   11/25/16 1900  vancomycin (VANCOCIN) IVPB 750 mg/150  ml premix     750 mg 150 mL/hr over 60 Minutes     11/23/16 1800  vancomycin (VANCOCIN) 500 mg in sodium chloride 0.9 % 100 mL  IVPB  Status:  Discontinued     500 mg 100 mL/hr over 60 Minutes 11/25/16 1849   11/23/16 1000  ceFEPIme (MAXIPIME) 1 g in dextrose 5 % 50 mL IVPB     1 g 100 mL/hr over 30 Minutes     11/23/16 0800  vancomycin (VANCOCIN) IVPB 1000 mg/200 mL premix     1,000 mg 200 mL/hr over 60 Minutes 11/23/16 0903       Devices    LINES / TUBES:      Continuous Infusions: . amiodarone 30 mg/hr (11/28/16 0434)     Objective: Vitals:   11/28/16 1100 11/28/16 1156 11/28/16 1200 11/28/16 1300  BP: 111/63  122/76 98/62  Pulse:      Resp: 17  (!) 21 (!) 21  Temp:  97.8 F (36.6 C)    TempSrc:  Oral    SpO2: 93%  100% 100%  Weight:      Height:        Intake/Output Summary (Last 24 hours) at 11/28/16 1433 Last data filed at 11/28/16 1200  Gross per 24 hour  Intake           483.27 ml  Output             1050 ml  Net          -566.73 ml   Filed Weights   11/24/16 0500 11/26/16 0515 11/27/16 1215  Weight: 58 kg (127 lb 13.9 oz) 60.6 kg (133 lb 9.6 oz) 61.2 kg (134 lb 14.7 oz)    Examination:  General: Somnolent, arousable with stimulation (A/O 1 does not know why, when, where), acute respiratory distress Eyes: negative scleral hemorrhage, negative anisocoria, negative icterus ENT: Negative Runny nose, negative gingival bleeding, Neck:  Negative scars, masses, torticollis, lymphadenopathy, JVD Lungs: decreased breath sounds LLL, clear to auscultation RLL/RUL,without wheezes or crackles Cardiovascular: Irregular irregular rhythm and rate, without murmur gallop or rub normal S1 and S2 Abdomen: negative abdominal pain, nondistended, positive soft, bowel sounds, no rebound, no ascites, no appreciable mass Extremities: No significant cyanosis, clubbing, or edema bilateral lower extremities Skin: Negative rashes, lesions, ulcers Psychiatric:  Unable to fully evaluate secondary to patient's altered mental status  Central nervous system:  Cranial nerves II through XII intact,  tongue/uvula midline. Unable to fully evaluate secondary to patient's altered mental status  .     Data Reviewed: Care during the described time interval was provided by me .  I have reviewed this patient's available data, including medical history, events of note, physical examination, and Jackson test results as part of my evaluation. I have personally reviewed and interpreted Jackson radiology studies.  CBC:  Recent Labs Lab 11/23/16 0105  11/24/16 0428 11/25/16 0454 11/25/16 1752 11/27/16 0045 11/28/16 0900  WBC 18.7*  < > 14.7* 13.8* 13.5* 10.5 10.1  NEUTROABS 17.1*  --   --   --   --   --   --   HGB 11.9*  < > 10.7* 10.1* 9.6* 9.4* 8.4*  HCT 36.5  < > 33.1* 31.3* 29.7* 28.5* 25.9*  MCV 97.3  < > 99.4 99.1 99.0 97.6 94.9  PLT 139*  < > 118* 109* 122* 135* 152  < > = values in this interval not displayed. Basic Metabolic Panel:  Recent Labs Lab 11/23/16  1610 11/24/16 0428 11/25/16 0454 11/26/16 0459 11/27/16 0045 11/28/16 0900  NA 135 130* 128* 128*  --  125*  K 3.8 4.0 3.9 3.5  --  3.5  CL 93* 90* 85* 77*  --  78*  CO2 39* 36* 37* 45*  --  40*  GLUCOSE 106* 117* 131* 90  --  206*  BUN 16 17 14 13   --  8  CREATININE 0.33* 0.34* 0.32* 0.33*  --  0.32*  CALCIUM 7.2* 7.9* 7.5* 7.1*  --  7.7*  MG  --   --  1.7  --  1.5*  --    GFR: Estimated Creatinine Clearance: 60.4 mL/min (by C-G formula based on SCr of 0.32 mg/dL (L)). Liver Function Tests:  Recent Labs Lab 11/23/16 0105 11/24/16 0428  AST 26 21  ALT 58* 43  ALKPHOS 56 53  BILITOT 1.1 0.6  PROT 5.2* 4.6*  ALBUMIN 3.2* 2.6*   No results for input(s): LIPASE, AMYLASE in the last 168 hours.  Recent Labs Lab 11/27/16 0045  AMMONIA 51*   Coagulation Profile: No results for input(s): INR, PROTIME in the last 168 hours. Cardiac Enzymes:  Recent Labs Lab 11/23/16 0105 11/27/16 0045  TROPONINI 0.03* <0.03   BNP (last 3 results) No results for input(s): PROBNP in the last 8760 hours. HbA1C: No results  for input(s): HGBA1C in the last 72 hours. CBG: No results for input(s): GLUCAP in the last 168 hours. Lipid Profile: No results for input(s): CHOL, HDL, LDLCALC, TRIG, CHOLHDL, LDLDIRECT in the last 72 hours. Thyroid Function Tests: No results for input(s): TSH, T4TOTAL, FREET4, T3FREE, THYROIDAB in the last 72 hours. Anemia Panel: No results for input(s): VITAMINB12, FOLATE, FERRITIN, TIBC, IRON, RETICCTPCT in the last 72 hours. Urine analysis:    Component Value Date/Time   COLORURINE YELLOW 11/23/2016 0226   APPEARANCEUR HAZY (A) 11/23/2016 0226   LABSPEC 1.016 11/23/2016 0226   PHURINE 8.0 11/23/2016 0226   GLUCOSEU NEGATIVE 11/23/2016 0226   HGBUR NEGATIVE 11/23/2016 0226   BILIRUBINUR NEGATIVE 11/23/2016 0226   KETONESUR NEGATIVE 11/23/2016 0226   PROTEINUR 30 (A) 11/23/2016 0226   NITRITE NEGATIVE 11/23/2016 0226   LEUKOCYTESUR NEGATIVE 11/23/2016 0226   Sepsis Labs: @LABRCNTIP (procalcitonin:4,lacticidven:4)  ) Recent Results (from the past 240 hour(s))  MRSA PCR Screening     Status: None   Collection Time: 11/23/16  6:00 AM  Result Value Ref Range Status   MRSA by PCR NEGATIVE NEGATIVE Final    Comment:        The GeneXpert MRSA Assay (FDA approved for NASAL specimens only), is one component of a comprehensive MRSA colonization surveillance program. It is not intended to diagnose MRSA infection nor to guide or monitor treatment for MRSA infections.   Culture, blood (Routine X 2) w Reflex to ID Panel     Status: None   Collection Time: 11/23/16  7:38 AM  Result Value Ref Range Status   Specimen Description BLOOD RIGHT ANTECUBITAL  Final   Special Requests BOTTLES DRAWN AEROBIC AND ANAEROBIC 8CC  Final   Culture NO GROWTH 5 DAYS  Final   Report Status 11/28/2016 FINAL  Final  Culture, blood (Routine X 2) w Reflex to ID Panel     Status: None   Collection Time: 11/23/16  7:45 AM  Result Value Ref Range Status   Specimen Description BLOOD LEFT ANTECUBITAL   Final   Special Requests BOTTLES DRAWN AEROBIC AND ANAEROBIC 10CC  Final   Culture NO GROWTH 5  DAYS  Final   Report Status 11/28/2016 FINAL  Final  MRSA PCR Screening     Status: None   Collection Time: 11/27/16 12:27 PM  Result Value Ref Range Status   MRSA by PCR NEGATIVE NEGATIVE Final    Comment:        The GeneXpert MRSA Assay (FDA approved for NASAL specimens only), is one component of a comprehensive MRSA colonization surveillance program. It is not intended to diagnose MRSA infection nor to guide or monitor treatment for MRSA infections.          Radiology Studies: Dg Chest Port 1 View  Result Date: 11/28/2016 CLINICAL DATA:  Acute respiratory failure with hypoxia. EXAM: PORTABLE CHEST 1 VIEW COMPARISON:  11/27/2016 FINDINGS: Right-sided PICC line with tip at the cavoatrial junction. Lungs are adequately inflated demonstrate suggestion of a small right effusion with interval improvement. Opacification in the left base likely moderate size left effusion with associated atelectasis without significant change. Cardiomediastinal silhouette is within normal. There is calcified plaque over the aortic arch. IMPRESSION: Left base opacification likely moderate size effusion with associated atelectasis. Cannot exclude infection in the left base. Improved small right effusion. Right-sided PICC line with tip at the cavoatrial junction. Aortic atherosclerosis. Electronically Signed   By: Elberta Fortisaniel  Boyle M.D.   On: 11/28/2016 08:41   Dg Chest Port 1 View  Result Date: 11/27/2016 CLINICAL DATA:  Hypoxia. EXAM: PORTABLE CHEST 1 VIEW COMPARISON:  11/26/2016 FINDINGS: Basilar consolidation and effusions persist bilaterally, worsened on the right. Apices are obscured by kyphosis. Right upper extremity PICC line probably reaches the right atrium. IMPRESSION: Consolidation and effusions in both bases, worsened on the right. Electronically Signed   By: Ellery Plunkaniel R Mitchell M.D.   On: 11/27/2016 00:56          Scheduled Meds: . budesonide (PULMICORT) nebulizer solution  0.5 mg Nebulization BID  . ceFEPime (MAXIPIME) IV  1 g Intravenous Q12H  . feeding supplement (ENSURE ENLIVE)  237 mL Oral BID BM  . furosemide  20 mg Intravenous Daily  . ipratropium  0.5 mg Nebulization Q6H  . levalbuterol  0.63 mg Nebulization Q6H  . multivitamin with minerals  1 tablet Oral Daily  . pantoprazole  40 mg Oral Daily  . rivaroxaban  20 mg Oral Daily  . sertraline  100 mg Oral Daily  . sodium chloride  250 mL Intravenous Once  . sodium chloride flush  10-40 mL Intracatheter Q12H  . vancomycin  750 mg Intravenous Q12H   Continuous Infusions: . amiodarone 30 mg/hr (11/28/16 0434)     LOS: 5 days    Time spent: 40 minutes    Ilda Laskin, Roselind MessierURTIS J, MD [facility] Pager [phone]   If 7PM-7AM, please contact night-coverage [email or web address] Password [email or web address] 11/28/2016, 2:33 PM

## 2016-11-28 NOTE — Progress Notes (Signed)
Pt HR up to 130's> Ekg obtained to confirm rhythm. Results called to Allyson SabalBerry, MD. No new orders at this time. Will continue to monitor.   Glade LloydMelissa Snow, RN

## 2016-11-28 NOTE — Progress Notes (Signed)
Pharmacy Antibiotic Note  Shannon Jackson is a 70 y.o. female admitted on 11/22/2016 with pneumonia.  Pharmacy has been consulted for VANCOMYCIN AND CEFEPIME dosing, now on day #6 of therapy.  Plan: Continue vanc at 750 mg IV q12hrs *Consider dc vanc as MRSA pcr (-) making MRSA PNA unlikely  *Will get VT if to continue  Cont cefepime 1gm IV q12hrs F/u renal function, cultures and clinical course  Height: 5\' 6"  (167.6 cm) Weight: 134 lb 14.7 oz (61.2 kg) IBW/kg (Calculated) : 59.3  Temp (24hrs), Avg:97.9 F (36.6 C), Min:97.6 F (36.4 C), Max:98.1 F (36.7 C)   Recent Labs Lab 11/23/16 0742 11/24/16 0428 11/25/16 0454 11/25/16 1752 11/26/16 0459 11/27/16 0045 11/28/16 0900  WBC 16.0* 14.7* 13.8* 13.5*  --  10.5 10.1  CREATININE 0.33* 0.34* 0.32*  --  0.33*  --  0.32*  LATICACIDVEN  --   --   --   --   --  0.8  --   VANCOTROUGH  --   --   --  10*  --   --   --     Estimated Creatinine Clearance: 60.4 mL/min (by C-G formula based on SCr of 0.32 mg/dL (L)).    Allergies  Allergen Reactions  . Codeine Nausea And Vomiting  . Levofloxacin     IV form caused swelling in hand, itching and burning.  . Lovenox [Enoxaparin Sodium]     Per EMS    Antimicrobials this admission: Vancomycin 12/19 >>  Cefepime 12/19 >>   Dose adjustments this admission: 12/21>>Vanc dose changed from 500 mg IV q12 to 750 mg IV q12  Microbiology results:  BCx: NG FINAL  MRSA PCR: (-)  Thank you for allowing pharmacy to be a part of this patient's care.   Tyler DeisErika K. Bonnye FavaNicolsen, PharmD, BCPS, CPP Clinical Pharmacist Pager: [phone] Phone: [phone] 11/28/2016 11:56 AM

## 2016-11-28 NOTE — Progress Notes (Signed)
Subjective:  Back in NSR on IV Amio. HR 70s  Objective:  Temp:  [97.7 F (36.5 C)-98.1 F (36.7 C)] 98.1 F (36.7 C) (12/24 0400) Pulse Rate:  [70-138] 90 (12/24 0019) Resp:  [14-32] 15 (12/24 0019) BP: (97-125)/(51-59) 97/51 (12/23 1504) SpO2:  [91 %-100 %] 100 % (12/24 0113) Arterial Line BP: (88-133)/(50-75) 107/51 (12/24 0019) FiO2 (%):  [40 %] 40 % (12/23 1215) Weight:  [134 lb 14.7 oz (61.2 kg)] 134 lb 14.7 oz (61.2 kg) (12/23 1215) Weight change:   Intake/Output from previous day: 12/23 0701 - 12/24 0700 In: 606.8 [P.O.:30; I.V.:326.8; IV Piggyback:250] Out: 350 [Urine:350]  Intake/Output from this shift: No intake/output data recorded.  Physical Exam: General appearance: alert and no distress Neck: no adenopathy, no carotid bruit, no JVD, supple, symmetrical, trachea midline and thyroid not enlarged, symmetric, no tenderness/mass/nodules Lungs: clear to auscultation bilaterally Heart: regular rate and rhythm, S1, S2 normal, no murmur, click, rub or gallop Extremities: extremities normal, atraumatic, no cyanosis or edema  Lab Results: Results for orders placed or performed during the hospital encounter of 11/22/16 (from the past 48 hour(s))  Cortisol     Status: None   Collection Time: 11/27/16 12:45 AM  Result Value Ref Range   Cortisol, Plasma 31.7 ug/dL    Comment: (NOTE) AM    6.7 - 22.6 ug/dL PM   <96.0<10.0       ug/dL [facility]   Lactic acid, plasma     Status: None   Collection Time: 11/27/16 12:45 AM  Result Value Ref Range   Lactic Acid, Venous 0.8 0.5 - 1.9 mmol/L  Troponin I     Status: None   Collection Time: 11/27/16 12:45 AM  Result Value Ref Range   Troponin I <0.03 <0.03 ng/mL  CBC     Status: Abnormal   Collection Time: 11/27/16 12:45 AM  Result Value Ref Range   WBC 10.5 4.0 - 10.5 K/uL   RBC 2.92 (L) 3.87 - 5.11 MIL/uL   Hemoglobin 9.4 (L) 12.0 - 15.0 g/dL   HCT 45.428.5 (L) 09.836.0 - 11.946.0 %   MCV 97.6 78.0 -  100.0 fL   MCH 32.2 26.0 - 34.0 pg   MCHC 33.0 30.0 - 36.0 g/dL   RDW 14.712.3 82.911.5 - 56.215.5 %   Platelets 135 (L) 150 - 400 K/uL  Magnesium     Status: Abnormal   Collection Time: 11/27/16 12:45 AM  Result Value Ref Range   Magnesium 1.5 (L) 1.7 - 2.4 mg/dL  Ammonia     Status: Abnormal   Collection Time: 11/27/16 12:45 AM  Result Value Ref Range   Ammonia 51 (H) 9 - 35 umol/L  MRSA PCR Screening     Status: None   Collection Time: 11/27/16 12:27 PM  Result Value Ref Range   MRSA by PCR NEGATIVE NEGATIVE    Comment:        The GeneXpert MRSA Assay (FDA approved for NASAL specimens only), is one component of a comprehensive MRSA colonization surveillance program. It is not intended to diagnose MRSA infection nor to guide or monitor treatment for MRSA infections.   I-STAT 3, arterial blood gas (G3+)     Status: Abnormal   Collection Time: 11/27/16 12:43 PM  Result Value Ref Range   pH, Arterial 7.469 (H) 7.350 - 7.450   pCO2 arterial 65.9 (HH) 32.0 - 48.0 mmHg   pO2, Arterial 84.0 83.0 - 108.0 mmHg  Bicarbonate 48.0 (H) 20.0 - 28.0 mmol/L   TCO2 >50 0 - 100 mmol/L   O2 Saturation 97.0 %   Acid-Base Excess 21.0 (H) 0.0 - 2.0 mmol/L   Patient temperature 97.8 F    Collection site FEMORAL ARTERY    Drawn by RT    Sample type ARTERIAL    Comment NOTIFIED PHYSICIAN   Blood gas, arterial     Status: Abnormal   Collection Time: 11/27/16 12:55 PM  Result Value Ref Range   FIO2 35.00    Delivery systems OXYGEN MASK    Mode BILEVEL POSITIVE AIRWAY PRESSURE    LHR 12 resp/min   pH, Arterial 7.416 7.350 - 7.450   pCO2 arterial 73.1 (HH) 32.0 - 48.0 mmHg    Comment: CRITICAL RESULT CALLED TO, READ BACK BY AND VERIFIED WITH:  TO SMITH J RN AT 0100 11/27/2016 BY [facility] RRT    pO2, Arterial 67.5 (L) 83.0 - 108.0 mmHg   Bicarbonate 43.0 (H) 20.0 - 28.0 mmol/L   Acid-Base Excess 20.1 (H) 0.0 - 2.0 mmol/L   O2 Saturation 94.3 %   Collection site RADIAL    Drawn by [id number]    Sample  type ARTERIAL    Allens test (pass/fail) PASS PASS    Imaging: Imaging results have been reviewed Left pleural effusion. Looks better. Less interstitial edema   Tele- NSR 70s  Assessment/Plan:   1. Principal Problem: 2.   Atrial fibrillation with rapid ventricular response (HCC) 3. Active Problems: 4.   COPD (chronic obstructive pulmonary disease) (HCC) 5.   Hyperlipidemia 6.   Acute on chronic respiratory failure (HCC) 7.   Hypoxemia 8.   Time Spent Directly with Patient:  20 minutes  Length of Stay:  LOS: 5 days   Pt is back in NSR on IV amio. Appears euvolemic. Left pleural effusion on CXR. Good sats. Afeb. Can transition to PO amio (400 mg PO BID X 7 days then 200 mg PO BID X 7 days then 200 mg daily). Continue Xarelto. Exam benign. Nothing further to add. Will need OP F/U with DR Diona BrownerMcDowell. Call if we can be of further assistance. Suspect pt stable enough to Tx to tele but will defer to PCCM.  Nanetta BattyBerry, Farouk Vivero 11/28/2016, 7:54 AM

## 2016-11-29 ENCOUNTER — Inpatient Hospital Stay (HOSPITAL_COMMUNITY): Payer: Medicare Other

## 2016-11-29 DIAGNOSIS — E784 Other hyperlipidemia: Secondary | ICD-10-CM

## 2016-11-29 LAB — RESPIRATORY PANEL BY PCR
Adenovirus: NOT DETECTED
BORDETELLA PERTUSSIS-RVPCR: NOT DETECTED
CORONAVIRUS 229E-RVPPCR: NOT DETECTED
CORONAVIRUS OC43-RVPPCR: NOT DETECTED
Chlamydophila pneumoniae: NOT DETECTED
Coronavirus HKU1: NOT DETECTED
Coronavirus NL63: NOT DETECTED
INFLUENZA B-RVPPCR: NOT DETECTED
Influenza A: NOT DETECTED
MYCOPLASMA PNEUMONIAE-RVPPCR: NOT DETECTED
Metapneumovirus: NOT DETECTED
PARAINFLUENZA VIRUS 1-RVPPCR: NOT DETECTED
Parainfluenza Virus 2: NOT DETECTED
Parainfluenza Virus 3: NOT DETECTED
Parainfluenza Virus 4: NOT DETECTED
RESPIRATORY SYNCYTIAL VIRUS-RVPPCR: NOT DETECTED
Rhinovirus / Enterovirus: NOT DETECTED

## 2016-11-29 LAB — BASIC METABOLIC PANEL
ANION GAP: 7 (ref 5–15)
BUN: 14 mg/dL (ref 6–20)
CO2: 42 mmol/L — ABNORMAL HIGH (ref 22–32)
Calcium: 7.8 mg/dL — ABNORMAL LOW (ref 8.9–10.3)
Chloride: 83 mmol/L — ABNORMAL LOW (ref 101–111)
Creatinine, Ser: 0.38 mg/dL — ABNORMAL LOW (ref 0.44–1.00)
Glucose, Bld: 165 mg/dL — ABNORMAL HIGH (ref 65–99)
Potassium: 3.7 mmol/L (ref 3.5–5.1)
SODIUM: 132 mmol/L — AB (ref 135–145)

## 2016-11-29 LAB — SODIUM
SODIUM: 130 mmol/L — AB (ref 135–145)
SODIUM: 131 mmol/L — AB (ref 135–145)
SODIUM: 133 mmol/L — AB (ref 135–145)
SODIUM: 135 mmol/L (ref 135–145)
SODIUM: 136 mmol/L (ref 135–145)
Sodium: 133 mmol/L — ABNORMAL LOW (ref 135–145)

## 2016-11-29 LAB — MAGNESIUM: MAGNESIUM: 1.8 mg/dL (ref 1.7–2.4)

## 2016-11-29 MED ORDER — SODIUM CHLORIDE 0.9 % IV SOLN
30.0000 meq | Freq: Once | INTRAVENOUS | Status: AC
  Administered 2016-11-29: 30 meq via INTRAVENOUS
  Filled 2016-11-29: qty 15

## 2016-11-29 MED ORDER — FUROSEMIDE 10 MG/ML IJ SOLN
20.0000 mg | Freq: Once | INTRAMUSCULAR | Status: AC
Start: 2016-11-29 — End: 2016-11-29
  Administered 2016-11-29: 20 mg via INTRAVENOUS
  Filled 2016-11-29: qty 2

## 2016-11-29 MED ORDER — MAGNESIUM SULFATE 2 GM/50ML IV SOLN
2.0000 g | Freq: Once | INTRAVENOUS | Status: AC
  Administered 2016-11-29: 2 g via INTRAVENOUS
  Filled 2016-11-29: qty 50

## 2016-11-29 MED ORDER — DIGOXIN 0.25 MG/ML IJ SOLN
0.1250 mg | Freq: Every day | INTRAMUSCULAR | Status: DC
  Administered 2016-11-30 – 2016-12-03 (×4): 0.125 mg via INTRAVENOUS
  Filled 2016-11-29 (×4): qty 2

## 2016-11-29 NOTE — Progress Notes (Signed)
PROGRESS NOTE    Shannon Jackson  ZOX:[id number] DOB: Jun 18, 1946 DOA: 11/22/2016 PCP: Frederica Kuster, MD   Brief Narrative:  70 y.o.WF PMHx Atrial fibrillation on Xarelto, Pulmonary Hypertension COPD, PE,Raynaud's syndrome, HLD, GERD,   Who came to the ED with generalized weakness for past 2 days. Patient was seen at [facility] couple of weeks ago and diagnosed with atrial fibrillation and failure to thrive at that time she was admitted to ICU and had several episodes of atrial fibrillation. [facility] to [facility] and was supposed to go to [place] for long-term care yesterday. Patient signed herself out AMA from [facility] on 11/21/2016 as she was not happy with the care provided at [facility]. At home patient started feeling generalized weakness and was brought to hospital for further evaluation. She denies chest pain, mild shortness of breath. + nausea, no vomiting or diarrhea.  In the ED patient was found to be in A. fib with RVR, cardiology fellow was consulted by ED physician. Patient started on amiodarone infusion, also given diltiazem.   Subjective: 12/25 A/O 4. Appears very depressed (continues to state she does not feel well although clearly clinical improvement from 12/24 i.e. now in NSR, SPO2 98%, BP normal). Continues to fixate on not having her Symbicort, although one nebulizers.      Assessment & Plan:   Principal Problem:   Atrial fibrillation with rapid ventricular response (HCC) Active Problems:   COPD (chronic obstructive pulmonary disease) (HCC)   Hyperlipidemia   Acute on chronic respiratory failure (HCC)   Hypoxemia   Disorientation   Acute on chronic respiratory failure with hypercapnia (HCC)   COPD exacerbation (HCC)   Pleural effusion, right   Pulmonary hypertension   Acute saddle pulmonary embolism with acute cor pulmonale (HCC)   Hyponatremia   Altered mental status -Respiratory failure?  Hyponatremia? -Patient with increasing PCO2, and decreasing O2 by ABG. -Restart patient on BiPAP titrated to maintain SPO2>, PCO2<65  -Recheck ABG at 2200 -Ammonia WNL -PT/OT consult pending  Depression -Appears may have severe depression, consult behavioral health with patient's hyponatremia will not be able restart SSRI. -12/25 consulted psychiatry for medication recommendations  Acute on Chronic Hypercarbic Respiratory Failure -multifactorial in setting of AF w RVR, possible PNA, underlying COPD -Bilateral Pleural Effusions - R>L -Complete 7 day course of antibiotic for  HCAP  COPD Exacerbation -Complete five-day course antibiotics. -Continue intermittent bipap support  -Titrate O2 for sats 88-94% -Xopenex QID -Solu-Medrol 60 mg daily -Physiotherapy vest BID: Patient unable to use flutter valve -Out of bed to chair  Right pleural effusion -Increasing right pleural effusion patient currently on BiPAP -See A. fib -If no improvement will consider thoracentesis on 12/26  Atrial Fibrillation w RVR  -Currently uncontrolled rate on amiodarone drip -Currently patient's BP unlikely to tolerate additional BB, CCB - Digoxin 250 g daily -Per cardiology decrease digoxin to 125 g daily starting 12/26 -Strict in and out since admission -2.8 L -Daily weight Filed Weights   11/26/16 0515 11/27/16 1215 11/29/16 0600  Weight: 60.6 kg (133 lb 9.6 oz) 61.2 kg (134 lb 14.7 oz) 64.3 kg (141 lb 12.1 oz)    Pulmonary Hypertension -See A. fib  PE, -Continue Xarelto  HLD  Hyponatremia  -Urine osmolality, urine sodium, serum osmolality, pending -Discontinued Zoloft -3% saline at 36ml/hr x 6 hours -DC 3% saline if sodium increases more than 10 mEq in 24 hours  Anemia  - supsect of chronic disease, no evidence of  acute bleeding, however dropped one unit overnight. -Repeat CBC  Recent Labs Lab 11/25/16 0454 11/25/16 1752 11/27/16 0045 11/28/16 0900 11/28/16 1630  HGB 10.1*  9.6* 9.4* 8.4* 8.5*  -Monitor for bleeding  Anxiety   Hypomagnesemia -Magnesium goal> 2 -Magnesium IV 2 g  Hypokalemia -Potassium goal> 4 -Potassium IV 30 mEq  Goals of care -On 12/26 Will consult PALLIATIVE CARE, discuss CODE STATUS, patient's long-term vs short-term goals of care     DVT prophylaxis: Xarelto Code Status: Partial Family Communication: Partial Disposition Plan: ?   Consultants:  Cardiology PC CM    Procedures/Significant Events:  12/20 Echocardiogram:Left ventricle: mild LVH. -LVEF=60%.- Tricuspid valve moderate regurgitation. - Pulmonary arteries: PA peak pressure: 60 mm Hg (S).   VENTILATOR SETTINGS: None   Cultures 12/19 MRSA by PCR negative 12/19 blood negative 12/24 respiratory virus panel negative    Antimicrobials:Anti-infectives    Start     Stop   11/25/16 1900  vancomycin (VANCOCIN) IVPB 750 mg/150 ml premix  Status:  Discontinued     11/29/16 1613   11/23/16 1800  vancomycin (VANCOCIN) 500 mg in sodium chloride 0.9 % 100 mL IVPB  Status:  Discontinued     11/25/16 1849   11/23/16 1000  ceFEPIme (MAXIPIME) 1 g in dextrose 5 % 50 mL IVPB         11/23/16 0800  vancomycin (VANCOCIN) IVPB 1000 mg/200 mL premix     11/23/16 0903       Devices    LINES / TUBES:      Continuous Infusions: . amiodarone 30 mg/hr (11/29/16 0244)     Objective: Vitals:   11/29/16 0913 11/29/16 1000 11/29/16 1150 11/29/16 1426  BP:  (!) 94/54  (!) 108/55  Pulse:  (!) 117  82  Resp:  (!) 22  (!) 24  Temp: 97.7 F (36.5 C)  97.6 F (36.4 C)   TempSrc: Axillary  Oral   SpO2:  100%  100%  Weight:      Height:        Intake/Output Summary (Last 24 hours) at 11/29/16 1613 Last data filed at 11/29/16 1100  Gross per 24 hour  Intake           1082.2 ml  Output             1140 ml  Net            -57.8 ml   Filed Weights   11/26/16 0515 11/27/16 1215 11/29/16 0600  Weight: 60.6 kg (133 lb 9.6 oz) 61.2 kg (134 lb 14.7 oz) 64.3  kg (141 lb 12.1 oz)    Examination:  General: A/O 4, positive acute on chronic respiratory distress Eyes: negative scleral hemorrhage, negative anisocoria, negative icterus ENT: Negative Runny nose, negative gingival bleeding, Neck:  Negative scars, masses, torticollis, lymphadenopathy, JVD Lungs: decreased breath sounds bibasilar, clear to auscultation main lung fields,without wheezes or crackles Cardiovascular: Regular rhythm and rate, without murmur gallop or rub normal S1 and S2 Abdomen: negative abdominal pain, nondistended, positive soft, bowel sounds, no rebound, no ascites, no appreciable mass Extremities: No significant cyanosis, clubbing, or edema bilateral lower extremities Skin: Negative rashes, lesions, ulcers Psychiatric:  Appears severely depressed Central nervous system:  Cranial nerves II through XII intact, tongue/uvula midline. Unable to fully evaluate secondary to patient's altered mental status  .     Data Reviewed: Care during the described time interval was provided by me .  I have reviewed this patient's available data, including  medical history, events of note, physical examination, and all test results as part of my evaluation. I have personally reviewed and interpreted all radiology studies.  CBC:  Recent Labs Lab 11/23/16 0105  11/25/16 0454 11/25/16 1752 11/27/16 0045 11/28/16 0900 11/28/16 1630  WBC 18.7*  < > 13.8* 13.5* 10.5 10.1 10.6*  NEUTROABS 17.1*  --   --   --   --   --   --   HGB 11.9*  < > 10.1* 9.6* 9.4* 8.4* 8.5*  HCT 36.5  < > 31.3* 29.7* 28.5* 25.9* 25.9*  MCV 97.3  < > 99.1 99.0 97.6 94.9 94.9  PLT 139*  < > 109* 122* 135* 152 151  < > = values in this interval not displayed. Basic Metabolic Panel:  Recent Labs Lab 11/25/16 0454 11/26/16 0459 11/27/16 0045 11/28/16 0900 11/28/16 1630  11/29/16 0435 11/29/16 0632 11/29/16 1025 11/29/16 1232 11/29/16 1432  NA 128* 128*  --  125* 127*  < > 132* 133* 135 136 133*  K 3.9  3.5  --  3.5 3.2*  --  3.7  --   --   --   --   CL 85* 77*  --  78* 76*  --  83*  --   --   --   --   CO2 37* 45*  --  40* 42*  --  42*  --   --   --   --   GLUCOSE 131* 90  --  206* 149*  --  165*  --   --   --   --   BUN 14 13  --  8 11  --  14  --   --   --   --   CREATININE 0.32* 0.33*  --  0.32* 0.35*  --  0.38*  --   --   --   --   CALCIUM 7.5* 7.1*  --  7.7* 8.2*  --  7.8*  --   --   --   --   MG 1.7  --  1.5*  --  2.0  --  1.8  --   --   --   --   < > = values in this interval not displayed. GFR: Estimated Creatinine Clearance: 60.4 mL/min (by C-G formula based on SCr of 0.38 mg/dL (L)). Liver Function Tests:  Recent Labs Lab 11/23/16 0105 11/24/16 0428 11/28/16 1630  AST 26 21 27   ALT 58* 43 51  ALKPHOS 56 53 61  BILITOT 1.1 0.6 0.5  PROT 5.2* 4.6* 4.2*  ALBUMIN 3.2* 2.6* 2.6*   No results for input(s): LIPASE, AMYLASE in the last 168 hours.  Recent Labs Lab 11/27/16 0045 11/28/16 1618  AMMONIA 51* 27   Coagulation Profile: No results for input(s): INR, PROTIME in the last 168 hours. Cardiac Enzymes:  Recent Labs Lab 11/23/16 0105 11/27/16 0045  TROPONINI 0.03* <0.03   BNP (last 3 results) No results for input(s): PROBNP in the last 8760 hours. HbA1C: No results for input(s): HGBA1C in the last 72 hours. CBG: No results for input(s): GLUCAP in the last 168 hours. Lipid Profile: No results for input(s): CHOL, HDL, LDLCALC, TRIG, CHOLHDL, LDLDIRECT in the last 72 hours. Thyroid Function Tests: No results for input(s): TSH, T4TOTAL, FREET4, T3FREE, THYROIDAB in the last 72 hours. Anemia Panel: No results for input(s): VITAMINB12, FOLATE, FERRITIN, TIBC, IRON, RETICCTPCT in the last 72 hours. Urine analysis:    Component Value Date/Time  COLORURINE YELLOW 11/23/2016 0226   APPEARANCEUR HAZY (A) 11/23/2016 0226   LABSPEC 1.016 11/23/2016 0226   PHURINE 8.0 11/23/2016 0226   GLUCOSEU NEGATIVE 11/23/2016 0226   HGBUR NEGATIVE 11/23/2016 0226    BILIRUBINUR NEGATIVE 11/23/2016 0226   KETONESUR NEGATIVE 11/23/2016 0226   PROTEINUR 30 (A) 11/23/2016 0226   NITRITE NEGATIVE 11/23/2016 0226   LEUKOCYTESUR NEGATIVE 11/23/2016 0226   Sepsis Labs: @LABRCNTIP (procalcitonin:4,lacticidven:4)  ) Recent Results (from the past 240 hour(s))  MRSA PCR Screening     Status: None   Collection Time: 11/23/16  6:00 AM  Result Value Ref Range Status   MRSA by PCR NEGATIVE NEGATIVE Final    Comment:        The GeneXpert MRSA Assay (FDA approved for NASAL specimens only), is one component of a comprehensive MRSA colonization surveillance program. It is not intended to diagnose MRSA infection nor to guide or monitor treatment for MRSA infections.   Culture, blood (Routine X 2) w Reflex to ID Panel     Status: None   Collection Time: 11/23/16  7:38 AM  Result Value Ref Range Status   Specimen Description BLOOD RIGHT ANTECUBITAL  Final   Special Requests BOTTLES DRAWN AEROBIC AND ANAEROBIC 8CC  Final   Culture NO GROWTH 5 DAYS  Final   Report Status 11/28/2016 FINAL  Final  Culture, blood (Routine X 2) w Reflex to ID Panel     Status: None   Collection Time: 11/23/16  7:45 AM  Result Value Ref Range Status   Specimen Description BLOOD LEFT ANTECUBITAL  Final   Special Requests BOTTLES DRAWN AEROBIC AND ANAEROBIC 10CC  Final   Culture NO GROWTH 5 DAYS  Final   Report Status 11/28/2016 FINAL  Final  MRSA PCR Screening     Status: None   Collection Time: 11/27/16 12:27 PM  Result Value Ref Range Status   MRSA by PCR NEGATIVE NEGATIVE Final    Comment:        The GeneXpert MRSA Assay (FDA approved for NASAL specimens only), is one component of a comprehensive MRSA colonization surveillance program. It is not intended to diagnose MRSA infection nor to guide or monitor treatment for MRSA infections.   Respiratory Panel by PCR     Status: None   Collection Time: 11/29/16  3:30 AM  Result Value Ref Range Status   Adenovirus NOT  DETECTED NOT DETECTED Final   Coronavirus 229E NOT DETECTED NOT DETECTED Final   Coronavirus HKU1 NOT DETECTED NOT DETECTED Final   Coronavirus NL63 NOT DETECTED NOT DETECTED Final   Coronavirus OC43 NOT DETECTED NOT DETECTED Final   Metapneumovirus NOT DETECTED NOT DETECTED Final   Rhinovirus / Enterovirus NOT DETECTED NOT DETECTED Final   Influenza A NOT DETECTED NOT DETECTED Final   Influenza B NOT DETECTED NOT DETECTED Final   Parainfluenza Virus 1 NOT DETECTED NOT DETECTED Final   Parainfluenza Virus 2 NOT DETECTED NOT DETECTED Final   Parainfluenza Virus 3 NOT DETECTED NOT DETECTED Final   Parainfluenza Virus 4 NOT DETECTED NOT DETECTED Final   Respiratory Syncytial Virus NOT DETECTED NOT DETECTED Final   Bordetella pertussis NOT DETECTED NOT DETECTED Final   Chlamydophila pneumoniae NOT DETECTED NOT DETECTED Final   Mycoplasma pneumoniae NOT DETECTED NOT DETECTED Final         Radiology Studies: Dg Chest Port 1 View  Result Date: 11/29/2016 CLINICAL DATA:  Followup of pleural effusion. COPD. Hyperlipidemia. EXAM: PORTABLE CHEST 1 VIEW COMPARISON:  11/28/2016 FINDINGS:  Patient rotated to the right. Right sided PICC line terminates at the mid right atrium, may have been advanced in the interval. Cardiomegaly accentuated by AP portable technique. Left greater than right pleural effusions persist. No pneumothorax. Interstitial edema is moderate and increased. Left greater than right base airspace disease. IMPRESSION: Slight worsening aeration with increasing congestive heart failure. Similar bilateral pleural effusions with bibasilar airspace disease. Right-sided PICC line may have been advanced in the interval and terminates at the mid right atrium. Consider retraction 3-4 cm. Electronically Signed   By: Jeronimo Greaves M.D.   On: 11/29/2016 08:19   Dg Chest Port 1 View  Result Date: 11/28/2016 CLINICAL DATA:  Acute respiratory failure.  Pneumonia. EXAM: PORTABLE CHEST 1 VIEW  COMPARISON:  11/28/2016 FINDINGS: Right PICC line, stable. Cardiomediastinal silhouette is normal. Mediastinal contours appear intact. Calcific atherosclerotic disease of the aorta. No evidence of pneumothorax. Mild upper lobe predominant emphysematous changes. There is persistent left pleural effusion, moderate in size. There has been interval development of moderate in size right pleural effusion. Bilateral lower lobe airspace consolidation versus atelectasis. Osseous structures are without acute abnormality. Soft tissues are grossly normal. IMPRESSION: Interval development of moderate in size right pleural effusion. Stable to increased in size left pleural effusion. Bilateral lower lobe airspace consolidation versus atelectasis. Moderate in severity emphysematous changes. Electronically Signed   By: Ted Mcalpine M.D.   On: 11/28/2016 18:25   Dg Chest Port 1 View  Result Date: 11/28/2016 CLINICAL DATA:  Acute respiratory failure with hypoxia. EXAM: PORTABLE CHEST 1 VIEW COMPARISON:  11/27/2016 FINDINGS: Right-sided PICC line with tip at the cavoatrial junction. Lungs are adequately inflated demonstrate suggestion of a small right effusion with interval improvement. Opacification in the left base likely moderate size left effusion with associated atelectasis without significant change. Cardiomediastinal silhouette is within normal. There is calcified plaque over the aortic arch. IMPRESSION: Left base opacification likely moderate size effusion with associated atelectasis. Cannot exclude infection in the left base. Improved small right effusion. Right-sided PICC line with tip at the cavoatrial junction. Aortic atherosclerosis. Electronically Signed   By: Elberta Fortis M.D.   On: 11/28/2016 08:41        Scheduled Meds: . budesonide (PULMICORT) nebulizer solution  0.5 mg Nebulization BID  . ceFEPime (MAXIPIME) IV  1 g Intravenous Q12H  . [START ON 11/30/2016] digoxin  0.125 mg Intravenous Daily    . feeding supplement (ENSURE ENLIVE)  237 mL Oral BID BM  . ipratropium  0.5 mg Nebulization Q6H  . levalbuterol  0.63 mg Nebulization Q6H  . methylPREDNISolone (SOLU-MEDROL) injection  60 mg Intravenous Q24H  . multivitamin with minerals  1 tablet Oral Daily  . pantoprazole  40 mg Oral Daily  . rivaroxaban  20 mg Oral Daily  . sodium chloride  250 mL Intravenous Once  . sodium chloride flush  10-40 mL Intracatheter Q12H   Continuous Infusions: . amiodarone 30 mg/hr (11/29/16 0244)     LOS: 6 days    Time spent: 40 minutes    Jasminemarie Sherrard, Roselind Messier, MD [facility] Pager [phone]   If 7PM-7AM, please contact night-coverage [email or web address] Password [facility] 11/29/2016, 4:13 PM

## 2016-11-29 NOTE — Progress Notes (Signed)
   Dr Hazle CocaBerry's note reviewed from yesterday, no further recs at that time. Telemetry was reviewed this AM. Patient with some supraventricular ectopy, episodes of afib but currently back in NSR. Some elevated rates are expected given her acute medical illness. Remains on amio gtt, started on IV digoxin yesterday, soft bp's and thought would not tolerate AV nodal agents. With altered mental status and bipap unrelaible PO intake, ok to continue IV amio and digoxin today. Can convert to oral once more stable oral intake. Would change to digoxin 0.125mg  IV starting tomorrow.    Shannon Jackson, M.D.

## 2016-11-29 NOTE — Progress Notes (Signed)
CPT held patient wants to rest.

## 2016-11-29 NOTE — Progress Notes (Signed)
Dr. Julien NordmannLangeland notified of low UOP, orders received

## 2016-11-30 ENCOUNTER — Inpatient Hospital Stay (HOSPITAL_COMMUNITY): Payer: Medicare Other

## 2016-11-30 DIAGNOSIS — F324 Major depressive disorder, single episode, in partial remission: Secondary | ICD-10-CM

## 2016-11-30 DIAGNOSIS — Z9119 Patient's noncompliance with other medical treatment and regimen: Secondary | ICD-10-CM

## 2016-11-30 DIAGNOSIS — Z8261 Family history of arthritis: Secondary | ICD-10-CM

## 2016-11-30 DIAGNOSIS — E872 Acidosis: Secondary | ICD-10-CM

## 2016-11-30 DIAGNOSIS — J90 Pleural effusion, not elsewhere classified: Secondary | ICD-10-CM

## 2016-11-30 DIAGNOSIS — J9601 Acute respiratory failure with hypoxia: Secondary | ICD-10-CM

## 2016-11-30 DIAGNOSIS — Z8489 Family history of other specified conditions: Secondary | ICD-10-CM

## 2016-11-30 DIAGNOSIS — Z91199 Patient's noncompliance with other medical treatment and regimen due to unspecified reason: Secondary | ICD-10-CM

## 2016-11-30 DIAGNOSIS — Z79899 Other long term (current) drug therapy: Secondary | ICD-10-CM

## 2016-11-30 DIAGNOSIS — Z888 Allergy status to other drugs, medicaments and biological substances status: Secondary | ICD-10-CM

## 2016-11-30 DIAGNOSIS — E8729 Other acidosis: Secondary | ICD-10-CM

## 2016-11-30 DIAGNOSIS — Z87891 Personal history of nicotine dependence: Secondary | ICD-10-CM

## 2016-11-30 LAB — BLOOD GAS, ARTERIAL
Acid-Base Excess: 22.7 mmol/L — ABNORMAL HIGH (ref 0.0–2.0)
BICARBONATE: 50.1 mmol/L — AB (ref 20.0–28.0)
DRAWN BY: 441371
O2 Content: 2 L/min
O2 Saturation: 92.9 %
PH ART: 7.31 — AB (ref 7.350–7.450)
PO2 ART: 64.9 mmHg — AB (ref 83.0–108.0)
Patient temperature: 97.8
pCO2 arterial: 102 mmHg (ref 32.0–48.0)

## 2016-11-30 LAB — BASIC METABOLIC PANEL
ANION GAP: 4 — AB (ref 5–15)
BUN: 15 mg/dL (ref 6–20)
CALCIUM: 8.5 mg/dL — AB (ref 8.9–10.3)
CO2: 47 mmol/L — ABNORMAL HIGH (ref 22–32)
Chloride: 83 mmol/L — ABNORMAL LOW (ref 101–111)
Creatinine, Ser: <0.3 mg/dL — ABNORMAL LOW (ref 0.44–1.00)
Glucose, Bld: 91 mg/dL (ref 65–99)
POTASSIUM: 3.9 mmol/L (ref 3.5–5.1)
SODIUM: 134 mmol/L — AB (ref 135–145)

## 2016-11-30 LAB — MAGNESIUM: MAGNESIUM: 2.1 mg/dL (ref 1.7–2.4)

## 2016-11-30 MED ORDER — FUROSEMIDE 10 MG/ML IJ SOLN
60.0000 mg | Freq: Every day | INTRAMUSCULAR | Status: DC
  Administered 2016-11-30 – 2016-12-05 (×6): 60 mg via INTRAVENOUS
  Filled 2016-11-30 (×6): qty 6

## 2016-11-30 MED ORDER — ALBUMIN HUMAN 25 % IV SOLN
50.0000 g | Freq: Once | INTRAVENOUS | Status: AC
  Administered 2016-11-30: 50 g via INTRAVENOUS
  Filled 2016-11-30: qty 50

## 2016-11-30 MED ORDER — FUROSEMIDE 10 MG/ML IJ SOLN
60.0000 mg | Freq: Once | INTRAMUSCULAR | Status: AC
  Administered 2016-11-30: 60 mg via INTRAVENOUS
  Filled 2016-11-30: qty 6

## 2016-11-30 MED ORDER — METHYLPREDNISOLONE SODIUM SUCC 40 MG IJ SOLR
30.0000 mg | INTRAMUSCULAR | Status: DC
  Administered 2016-11-30 – 2016-12-01 (×2): 30 mg via INTRAVENOUS
  Filled 2016-11-30 (×2): qty 1

## 2016-11-30 NOTE — Progress Notes (Signed)
Patient with short breath , poor air movement O2 saturation trending down in the low 90's via HFNC oxygen. Due breathing treatment was given and respiratory was paged will see patient. Reposition patient high fowlers and patient verbalized slight relieve of short breath. Will continue to monitor.

## 2016-11-30 NOTE — Progress Notes (Addendum)
PROGRESS NOTE    Shannon Jackson  WUJ:[id number] DOB: [birth year] DOA: 11/22/2016 PCP: Frederica KusterMILLER, STEPHEN M, MD   Brief Narrative:  70 y.o.WF PMHx Atrial fibrillation on Xarelto, Pulmonary Hypertension COPD, PE,Raynaud's syndrome, HLD, GERD,   Who came to the ED with generalized weakness for past 2 days. Patient was seen at [facility] couple of weeks ago and diagnosed with atrial fibrillation and failure to thrive at that time she was admitted to ICU and had several episodes of atrial fibrillation. [facility] and was supposed to go to [place] for long-term care yesterday. Patient signed herself out AMA from [facility] on 11/21/2016 as she was not happy with the care provided at [facility]. At home patient started feeling generalized weakness and was brought to hospital for further evaluation. She denies chest pain, mild shortness of breath. + nausea, no vomiting or diarrhea.  In the ED patient was found to be in A. fib with RVR, cardiology fellow was consulted by ED physician. Patient started on amiodarone infusion, also given diltiazem.   Subjective: 12/26 A/O 4. Patient refused BiPAP. Patient more lethargic this a.m., ABG showed PCO2= 102. PH 7.3. PCO2= 64.9        Assessment & Plan:   Principal Problem:   Atrial fibrillation with rapid ventricular response (HCC) Active Problems:   COPD (chronic obstructive pulmonary disease) (HCC)   Hyperlipidemia   Acute on chronic respiratory failure (HCC)   Hypoxemia   Disorientation   Acute on chronic respiratory failure with hypercapnia (HCC)   COPD exacerbation (HCC)   Pleural effusion, right   Pulmonary hypertension   Acute saddle pulmonary embolism with acute cor pulmonale (HCC)   Hyponatremia   Altered mental status -Respiratory failure? Hyponatremia? -Patient with increasing PCO2, and decreasing O2 by ABG. -Restart patient on BiPAP titrated to maintain SPO2 89-93%,  PCO2<65  -Recheck ABG at 2200 -Ammonia WNL -PT/OT consult pending  Depression -Appears may have severe depression, consult behavioral health with patient's hyponatremia will not be able restart SSRI. -12/25 consulted psychiatry for medication recommendations  Noncompliance hospital treatment. -Counseled patient that she has the right to refuse any further diagnostic or therapeutic treatment, however refused to do so would result in the continuation of her PCO2 to increase and at some point she would stop breathing. Counseled patient that if she chose this course of action we needed to make her DO NOT RESUSCITATE. Patient decided that she would accept recommended treatment modalities.  Acute on Chronic Hypercarbic Respiratory Failure -multifactorial in setting of AF w RVR, possible PNA, underlying COPD -Bilateral Pleural Effusions - R>L -Complete 7 day course of antibiotic for  HCAP  Respiratory acidosis -Compensated, patient continues to feel uncomfortable and short of breath, most likely secondary to pleural effusion vs her respiratory acidosis.  COPD Exacerbation -Complete five-day course antibiotics. -Continue intermittent bipap support  -Titrate O2 for sats 88-94% -Xopenex QID -Decreased Solu-Medrol 30 mg daily (continued rapid titration off) -Physiotherapy vest BID: Patient unable to use flutter valve -Out of bed to chair  Right pleural effusion -Increasing right pleural effusion patient currently on BiPAP -See A. fib -Patient would like to  try a more conservative approach to treating her pleural effusion prior to trying thoracentesis. Will treat with albumin + Lasix. However counseled patient if this does not work she will need to consent to thoracentesis. -Obtain chest CT later today. If no improvement thoracentesis on 12/27 or 12/28.  Atrial Fibrillation w RVR  -Currently uncontrolled  rate on amiodarone drip -Currently patient's BP unlikely to tolerate additional BB,  CCB - Digoxin 250 g daily -Per cardiology decrease digoxin to 125 g daily starting 12/26 -Strict in and out since admission -2.8 L -Daily weight Filed Weights   11/27/16 1215 11/29/16 0600 11/30/16 0600  Weight: 61.2 kg (134 lb 14.7 oz) 64.3 kg (141 lb 12.1 oz) 63.2 kg (139 lb 5.3 oz)    Pulmonary Hypertension -See A. fib  PE, -Continue Xarelto  HLD  Hyponatremia  -Urine osmolality, urine sodium, serum osmolality, pending -Resolved with use of 3% normal saline and discontinuation of SSRI  Anemia  - supsect of chronic disease, no evidence of acute bleeding, however dropped one unit overnight. -Repeat CBC  Recent Labs Lab 11/25/16 0454 11/25/16 1752 11/27/16 0045 11/28/16 0900 11/28/16 1630  HGB 10.1* 9.6* 9.4* 8.4* 8.5*  -Monitor for bleeding  Anxiety   Hypomagnesemia -Magnesium goal> 2  Hypokalemia -Potassium goal> 4  Goals of care -On 12/26 Will consult PALLIATIVE CARE, discuss CODE STATUS, patient's long-term vs short-term goals of care     DVT prophylaxis: Xarelto Code Status: Partial Family Communication: Spoke at length with Steward DroneBrenda (sister) and discuss plan of care Disposition Plan: ?   Consultants:  Cardiology PC CM    Procedures/Significant Events:  12/20 Echocardiogram:Left ventricle: mild LVH. -LVEF=60%.- Tricuspid valve moderate regurgitation. - Pulmonary arteries: PA peak pressure: 60 mm Hg (S).   VENTILATOR SETTINGS: None   Cultures 12/19 MRSA by PCR negative 12/19 blood negative 12/24 respiratory virus panel negative    Antimicrobials:Anti-infectives    Start     Stop   11/25/16 1900  vancomycin (VANCOCIN) IVPB 750 mg/150 ml premix  Status:  Discontinued     11/29/16 1613   11/23/16 1800  vancomycin (VANCOCIN) 500 mg in sodium chloride 0.9 % 100 mL IVPB  Status:  Discontinued     11/25/16 1849   11/23/16 1000  ceFEPIme (MAXIPIME) 1 g in dextrose 5 % 50 mL IVPB         11/23/16 0800  vancomycin (VANCOCIN) IVPB 1000  mg/200 mL premix     11/23/16 0903       Devices    LINES / TUBES:      Continuous Infusions: . amiodarone 30 mg/hr (11/30/16 0700)     Objective: Vitals:   11/30/16 0700 11/30/16 0729 11/30/16 0736 11/30/16 0743  BP: 112/64 123/73    Pulse: (!) 112 (!) 115 (!) 115 (!) 111  Resp: (!) 25 (!) 22 (!) 23 (!) 23  Temp:      TempSrc:      SpO2: 92% 96% 96% 96%  Weight:      Height:        Intake/Output Summary (Last 24 hours) at 11/30/16 0801 Last data filed at 11/30/16 0700  Gross per 24 hour  Intake            407.1 ml  Output             1940 ml  Net          -1532.9 ml   Filed Weights   11/27/16 1215 11/29/16 0600 11/30/16 0600  Weight: 61.2 kg (134 lb 14.7 oz) 64.3 kg (141 lb 12.1 oz) 63.2 kg (139 lb 5.3 oz)    Examination:  General: A/O 4, positive acute on chronic respiratory distress Eyes: negative scleral hemorrhage, negative anisocoria, negative icterus ENT: Negative Runny nose, negative gingival bleeding, Neck:  Negative scars, masses, torticollis, lymphadenopathy, JVD Lungs: Absent  breath sounds bibasilar, clear to auscultation main lung fields,without wheezes or crackles Cardiovascular: Regular rhythm and rate, without murmur gallop or rub normal S1 and S2 Abdomen: negative abdominal pain, nondistended, positive soft, bowel sounds, no rebound, no ascites, no appreciable mass Extremities: No significant cyanosis, clubbing, or edema bilateral lower extremities Skin: Negative rashes, lesions, ulcers Psychiatric:  Appears severely depressed Central nervous system:  Cranial nerves II through XII intact, tongue/uvula midline. Unable to fully evaluate secondary to patient's altered mental status  .     Data Reviewed: Care during the described time interval was provided by me .  I have reviewed this patient's available data, including medical history, events of note, physical examination, and all test results as part of my evaluation. I have personally  reviewed and interpreted all radiology studies.  CBC:  Recent Labs Lab 11/25/16 0454 11/25/16 1752 11/27/16 0045 11/28/16 0900 11/28/16 1630  WBC 13.8* 13.5* 10.5 10.1 10.6*  HGB 10.1* 9.6* 9.4* 8.4* 8.5*  HCT 31.3* 29.7* 28.5* 25.9* 25.9*  MCV 99.1 99.0 97.6 94.9 94.9  PLT 109* 122* 135* 152 151   Basic Metabolic Panel:  Recent Labs Lab 11/25/16 0454 11/26/16 0459 11/27/16 0045 11/28/16 0900 11/28/16 1630  11/29/16 0435 11/29/16 0632 11/29/16 1025 11/29/16 1232 11/29/16 1432 11/30/16 0212  NA 128* 128*  --  125* 127*  < > 132* 133* 135 136 133* 134*  K 3.9 3.5  --  3.5 3.2*  --  3.7  --   --   --   --  3.9  CL 85* 77*  --  78* 76*  --  83*  --   --   --   --  83*  CO2 37* 45*  --  40* 42*  --  42*  --   --   --   --  47*  GLUCOSE 131* 90  --  206* 149*  --  165*  --   --   --   --  91  BUN 14 13  --  8 11  --  14  --   --   --   --  15  CREATININE 0.32* 0.33*  --  0.32* 0.35*  --  0.38*  --   --   --   --  <0.30*  CALCIUM 7.5* 7.1*  --  7.7* 8.2*  --  7.8*  --   --   --   --  8.5*  MG 1.7  --  1.5*  --  2.0  --  1.8  --   --   --   --  2.1  < > = values in this interval not displayed. GFR: CrCl cannot be calculated (This lab value cannot be used to calculate CrCl because it is not a number: <0.30). Liver Function Tests:  Recent Labs Lab 11/24/16 0428 11/28/16 1630  AST 21 27  ALT 43 51  ALKPHOS 53 61  BILITOT 0.6 0.5  PROT 4.6* 4.2*  ALBUMIN 2.6* 2.6*   No results for input(s): LIPASE, AMYLASE in the last 168 hours.  Recent Labs Lab 11/27/16 0045 11/28/16 1618  AMMONIA 51* 27   Coagulation Profile: No results for input(s): INR, PROTIME in the last 168 hours. Cardiac Enzymes:  Recent Labs Lab 11/27/16 0045  TROPONINI <0.03   BNP (last 3 results) No results for input(s): PROBNP in the last 8760 hours. HbA1C: No results for input(s): HGBA1C in the last 72 hours. CBG: No results for input(s): GLUCAP in  the last 168 hours. Lipid Profile: No  results for input(s): CHOL, HDL, LDLCALC, TRIG, CHOLHDL, LDLDIRECT in the last 72 hours. Thyroid Function Tests: No results for input(s): TSH, T4TOTAL, FREET4, T3FREE, THYROIDAB in the last 72 hours. Anemia Panel: No results for input(s): VITAMINB12, FOLATE, FERRITIN, TIBC, IRON, RETICCTPCT in the last 72 hours. Urine analysis:    Component Value Date/Time   COLORURINE YELLOW 11/23/2016 0226   APPEARANCEUR HAZY (A) 11/23/2016 0226   LABSPEC 1.016 11/23/2016 0226   PHURINE 8.0 11/23/2016 0226   GLUCOSEU NEGATIVE 11/23/2016 0226   HGBUR NEGATIVE 11/23/2016 0226   BILIRUBINUR NEGATIVE 11/23/2016 0226   KETONESUR NEGATIVE 11/23/2016 0226   PROTEINUR 30 (A) 11/23/2016 0226   NITRITE NEGATIVE 11/23/2016 0226   LEUKOCYTESUR NEGATIVE 11/23/2016 0226   Sepsis Labs: @LABRCNTIP (procalcitonin:4,lacticidven:4)  ) Recent Results (from the past 240 hour(s))  MRSA PCR Screening     Status: None   Collection Time: 11/23/16  6:00 AM  Result Value Ref Range Status   MRSA by PCR NEGATIVE NEGATIVE Final    Comment:        The GeneXpert MRSA Assay (FDA approved for NASAL specimens only), is one component of a comprehensive MRSA colonization surveillance program. It is not intended to diagnose MRSA infection nor to guide or monitor treatment for MRSA infections.   Culture, blood (Routine X 2) w Reflex to ID Panel     Status: None   Collection Time: 11/23/16  7:38 AM  Result Value Ref Range Status   Specimen Description BLOOD RIGHT ANTECUBITAL  Final   Special Requests BOTTLES DRAWN AEROBIC AND ANAEROBIC 8CC  Final   Culture NO GROWTH 5 DAYS  Final   Report Status 11/28/2016 FINAL  Final  Culture, blood (Routine X 2) w Reflex to ID Panel     Status: None   Collection Time: 11/23/16  7:45 AM  Result Value Ref Range Status   Specimen Description BLOOD LEFT ANTECUBITAL  Final   Special Requests BOTTLES DRAWN AEROBIC AND ANAEROBIC 10CC  Final   Culture NO GROWTH 5 DAYS  Final   Report Status  11/28/2016 FINAL  Final  MRSA PCR Screening     Status: None   Collection Time: 11/27/16 12:27 PM  Result Value Ref Range Status   MRSA by PCR NEGATIVE NEGATIVE Final    Comment:        The GeneXpert MRSA Assay (FDA approved for NASAL specimens only), is one component of a comprehensive MRSA colonization surveillance program. It is not intended to diagnose MRSA infection nor to guide or monitor treatment for MRSA infections.   Respiratory Panel by PCR     Status: None   Collection Time: 11/29/16  3:30 AM  Result Value Ref Range Status   Adenovirus NOT DETECTED NOT DETECTED Final   Coronavirus 229E NOT DETECTED NOT DETECTED Final   Coronavirus HKU1 NOT DETECTED NOT DETECTED Final   Coronavirus NL63 NOT DETECTED NOT DETECTED Final   Coronavirus OC43 NOT DETECTED NOT DETECTED Final   Metapneumovirus NOT DETECTED NOT DETECTED Final   Rhinovirus / Enterovirus NOT DETECTED NOT DETECTED Final   Influenza A NOT DETECTED NOT DETECTED Final   Influenza B NOT DETECTED NOT DETECTED Final   Parainfluenza Virus 1 NOT DETECTED NOT DETECTED Final   Parainfluenza Virus 2 NOT DETECTED NOT DETECTED Final   Parainfluenza Virus 3 NOT DETECTED NOT DETECTED Final   Parainfluenza Virus 4 NOT DETECTED NOT DETECTED Final   Respiratory Syncytial Virus NOT DETECTED NOT DETECTED  Final   Bordetella pertussis NOT DETECTED NOT DETECTED Final   Chlamydophila pneumoniae NOT DETECTED NOT DETECTED Final   Mycoplasma pneumoniae NOT DETECTED NOT DETECTED Final         Radiology Studies: Dg Chest Port 1 View  Result Date: 11/29/2016 CLINICAL DATA:  Followup of pleural effusion. COPD. Hyperlipidemia. EXAM: PORTABLE CHEST 1 VIEW COMPARISON:  11/28/2016 FINDINGS: Patient rotated to the right. Right sided PICC line terminates at the mid right atrium, may have been advanced in the interval. Cardiomegaly accentuated by AP portable technique. Left greater than right pleural effusions persist. No pneumothorax.  Interstitial edema is moderate and increased. Left greater than right base airspace disease. IMPRESSION: Slight worsening aeration with increasing congestive heart failure. Similar bilateral pleural effusions with bibasilar airspace disease. Right-sided PICC line may have been advanced in the interval and terminates at the mid right atrium. Consider retraction 3-4 cm. Electronically Signed   By: Jeronimo Greaves M.D.   On: 11/29/2016 08:19   Dg Chest Port 1 View  Result Date: 11/28/2016 CLINICAL DATA:  Acute respiratory failure.  Pneumonia. EXAM: PORTABLE CHEST 1 VIEW COMPARISON:  11/28/2016 FINDINGS: Right PICC line, stable. Cardiomediastinal silhouette is normal. Mediastinal contours appear intact. Calcific atherosclerotic disease of the aorta. No evidence of pneumothorax. Mild upper lobe predominant emphysematous changes. There is persistent left pleural effusion, moderate in size. There has been interval development of moderate in size right pleural effusion. Bilateral lower lobe airspace consolidation versus atelectasis. Osseous structures are without acute abnormality. Soft tissues are grossly normal. IMPRESSION: Interval development of moderate in size right pleural effusion. Stable to increased in size left pleural effusion. Bilateral lower lobe airspace consolidation versus atelectasis. Moderate in severity emphysematous changes. Electronically Signed   By: Ted Mcalpine M.D.   On: 11/28/2016 18:25        Scheduled Meds: . albumin human  50 g Intravenous Once  . budesonide (PULMICORT) nebulizer solution  0.5 mg Nebulization BID  . ceFEPime (MAXIPIME) IV  1 g Intravenous Q12H  . digoxin  0.125 mg Intravenous Daily  . feeding supplement (ENSURE ENLIVE)  237 mL Oral BID BM  . ipratropium  0.5 mg Nebulization Q6H  . levalbuterol  0.63 mg Nebulization Q6H  . methylPREDNISolone (SOLU-MEDROL) injection  60 mg Intravenous Q24H  . multivitamin with minerals  1 tablet Oral Daily  .  pantoprazole  40 mg Oral Daily  . rivaroxaban  20 mg Oral Daily  . sodium chloride  250 mL Intravenous Once  . sodium chloride flush  10-40 mL Intracatheter Q12H   Continuous Infusions: . amiodarone 30 mg/hr (11/30/16 0700)     LOS: 7 days    Time spent: 40 minutes    Iban Utz, Roselind Messier, MD [facility] Pager [phone]   If 7PM-7AM, please contact night-coverage [email or web address] Password [email or web address] 11/30/2016, 8:01 AM

## 2016-11-30 NOTE — Progress Notes (Signed)
Nutrition Follow-up  INTERVENTION:   Recommend liberalize diet to Regular  Add Magic cup TID with meals, each supplement provides 290 kcal and 9 grams of protein  Continue Ensure Enlive po BID, each supplement provides 350 kcal and 20 grams of protein  NUTRITION DIAGNOSIS:   Increased nutrient needs related to chronic illness as evidenced by meal completion < 50%. Ongoing.   GOAL:   Patient will meet greater than or equal to 90% of their needs Not met.   MONITOR:   PO intake, Supplement acceptance, Labs, Weight trends  ASSESSMENT:   Pt ith PMH of atrial fibrillation on Xarelto, COPD, GERD, PE who came to the ED with generalized weakness for past 2 days. Patient was seen at [facility] couple of weeks ago and diagnosed with atrial fibrillation and failure to thrive at that time she was admitted to ICU and had several episodes of atrial fibrillation. [facility] to [facility] from [facility] and was supposed to go to [place] for long-term care yesterday. Patient signed herself out from [facility] on 11/21/2016 as she was not happy with the care provided at [facility].  Na 134 Per RN and son pt has only been drinking bites at meals. Pt currently asleep on BiPAP and unable to eat. Per RN pt is a do not intubate. Encouraged son to order meals, offer high calorie foods first, offer ensure at least twice per day. He is usually here from 10 am to 2 pm but he lives out of town and owns his own business.  Spoke with RN to advocate for liberalized diet.   Diet Order:  Diet Heart Room service appropriate? Yes; Fluid consistency: Thin  Skin:  Reviewed, no issues  Last BM:  12/23  Height:   Ht Readings from Last 1 Encounters:  11/27/16 '5\' 6"'$  (1.676 m)    Weight:   Wt Readings from Last 1 Encounters:  11/30/16 139 lb 5.3 oz (63.2 kg)    Ideal Body Weight:  59 kg  BMI:  Body mass index is 22.49 kg/m.  Estimated Nutritional Needs:   Kcal:   6962-9528  Protein:  73-78 gr  Fluid:  1.7 liters daily  EDUCATION NEEDS:   No education needs identified at this time  [place], [place], [place] Pager [phone] After Hours Pager

## 2016-11-30 NOTE — Progress Notes (Signed)
Subjective:   69 y.o.female with known history of atrial fib, COPD, GERD, PE and failure to thrive, who has been seen at [place] is elevated today .   May be back in atrial fib. C/o shortness of breath   Objective:  Temp:  [97.6 F (36.4 C)-97.9 F (36.6 C)] 97.7 F (36.5 C) (12/26 0800) Pulse Rate:  [71-117] 112 (12/26 0800) Resp:  [12-30] 30 (12/26 0800) BP: (85-123)/(46-73) 121/67 (12/26 0800) SpO2:  [89 %-100 %] 89 % (12/26 0800) Arterial Line BP: (101-130)/(41-59) 127/56 (12/26 0800) Weight:  [139 lb 5.3 oz (63.2 kg)] 139 lb 5.3 oz (63.2 kg) (12/26 0600) Weight change: -2 lb 6.8 oz (-1.1 kg)  Intake/Output from previous day: 12/25 0701 - 12/26 0700 In: 407.1 [I.V.:207.1; IV Piggyback:200] Out: 1940 [HYWVP:7106]  Intake/Output from this shift: No intake/output data recorded.  Physical Exam:  General:   Elderly female, appears chronically ill HEENT:   No JVD Lungs:   Clear [place]:   RR, tachy Abd:   NT,  + BS  EXT:   No C/C/E Neuro:  Awake, alert, answers some questions   Lab Results: Results for orders placed or performed during the hospital encounter of 11/22/16 (from the past 48 hour(s))  CBC     Status: Abnormal   Collection Time: 11/28/16  9:00 AM  Result Value Ref Range   WBC 10.1 4.0 - 10.5 K/uL   RBC 2.73 (L) 3.87 - 5.11 MIL/uL   Hemoglobin 8.4 (L) 12.0 - 15.0 g/dL   HCT 25.9 (L) 36.0 - 46.0 %   MCV 94.9 78.0 - 100.0 fL   MCH 30.8 26.0 - 34.0 pg   MCHC 32.4 30.0 - 36.0 g/dL   RDW 12.6 11.5 - 15.5 %   Platelets 152 150 - 400 K/uL  Basic metabolic panel     Status: Abnormal   Collection Time: 11/28/16  9:00 AM  Result Value Ref Range   Sodium 125 (L) 135 - 145 mmol/L   Potassium 3.5 3.5 - 5.1 mmol/L   Chloride 78 (L) 101 - 111 mmol/L   CO2 40 (H) 22 - 32 mmol/L   Glucose, Bld 206 (H) 65 - 99 mg/dL   BUN 8 6 - 20 mg/dL   Creatinine, Ser 0.32 (L) 0.44 - 1.00 mg/dL   Calcium 7.7 (L) 8.9 - 10.3 mg/dL   GFR calc non Af Amer >60  >60 mL/min   GFR calc Af Amer >60 >60 mL/min    Comment: (NOTE) The eGFR has been calculated using the CKD EPI equation. This calculation has not been validated in all clinical situations. eGFR's persistently <60 mL/min signify possible Chronic Kidney Disease.    Anion gap 7 5 - 15  Ammonia     Status: None   Collection Time: 11/28/16  4:18 PM  Result Value Ref Range   Ammonia 27 9 - 35 umol/L  Osmolality, urine     Status: None   Collection Time: 11/28/16  4:30 PM  Result Value Ref Range   Osmolality, Ur 473 300 - 900 mOsm/kg  Osmolality     Status: Abnormal   Collection Time: 11/28/16  4:30 PM  Result Value Ref Range   Osmolality 271 (L) 275 - 295 mOsm/kg  Na and K (sodium & potassium), rand urine     Status: None   Collection Time: 11/28/16  4:30 PM  Result Value Ref Range   Sodium, Ur <10 mmol/L   Potassium Urine  14 mmol/L  Blood gas, arterial     Status: Abnormal   Collection Time: 11/28/16  4:30 PM  Result Value Ref Range   O2 Content 3.0 L/min   Delivery systems NASAL CANNULA    pH, Arterial 7.311 (L) 7.350 - 7.450   pCO2 arterial 96.8 (HH) 32.0 - 48.0 mmHg    Comment: CRITICAL RESULT CALLED TO, READ BACK BY AND VERIFIED WITH: KIM GROENDAL, RRT AT 1642 BY C.HALL,RRT ON 11/28/2016    pO2, Arterial 169 (H) 83.0 - 108.0 mmHg   Bicarbonate 47.8 (H) 20.0 - 28.0 mmol/L   Acid-Base Excess 20.5 (H) 0.0 - 2.0 mmol/L   O2 Saturation 99.3 %   Patient temperature 97.7    Collection site A-LINE    Drawn by [id number]    Sample type ARTERIAL DRAW   Magnesium     Status: None   Collection Time: 11/28/16  4:30 PM  Result Value Ref Range   Magnesium 2.0 1.7 - 2.4 mg/dL  CBC     Status: Abnormal   Collection Time: 11/28/16  4:30 PM  Result Value Ref Range   WBC 10.6 (H) 4.0 - 10.5 K/uL   RBC 2.73 (L) 3.87 - 5.11 MIL/uL   Hemoglobin 8.5 (L) 12.0 - 15.0 g/dL   HCT 25.9 (L) 36.0 - 46.0 %   MCV 94.9 78.0 - 100.0 fL   MCH 31.1 26.0 - 34.0 pg   MCHC 32.8 30.0 - 36.0 g/dL   RDW  12.5 11.5 - 15.5 %   Platelets 151 150 - 400 K/uL  Comprehensive metabolic panel     Status: Abnormal   Collection Time: 11/28/16  4:30 PM  Result Value Ref Range   Sodium 127 (L) 135 - 145 mmol/L   Potassium 3.2 (L) 3.5 - 5.1 mmol/L   Chloride 76 (L) 101 - 111 mmol/L   CO2 42 (H) 22 - 32 mmol/L   Glucose, Bld 149 (H) 65 - 99 mg/dL   BUN 11 6 - 20 mg/dL   Creatinine, Ser 0.35 (L) 0.44 - 1.00 mg/dL   Calcium 8.2 (L) 8.9 - 10.3 mg/dL   Total Protein 4.2 (L) 6.5 - 8.1 g/dL   Albumin 2.6 (L) 3.5 - 5.0 g/dL   AST 27 15 - 41 U/L   ALT 51 14 - 54 U/L   Alkaline Phosphatase 61 38 - 126 U/L   Total Bilirubin 0.5 0.3 - 1.2 mg/dL   GFR calc non Af Amer >60 >60 mL/min   GFR calc Af Amer >60 >60 mL/min    Comment: (NOTE) The eGFR has been calculated using the CKD EPI equation. This calculation has not been validated in all clinical situations. eGFR's persistently <60 mL/min signify possible Chronic Kidney Disease.    Anion gap 9 5 - 15  Uric acid     Status: Abnormal   Collection Time: 11/28/16  4:30 PM  Result Value Ref Range   Uric Acid, Serum 1.2 (L) 2.3 - 6.6 mg/dL  Sodium     Status: Abnormal   Collection Time: 11/28/16  6:32 PM  Result Value Ref Range   Sodium 123 (L) 135 - 145 mmol/L  Sodium     Status: Abnormal   Collection Time: 11/28/16 10:32 PM  Result Value Ref Range   Sodium 125 (L) 135 - 145 mmol/L  Blood gas, arterial     Status: Abnormal   Collection Time: 11/28/16 11:38 PM  Result Value Ref Range   FIO2 0.40  Delivery systems BILEVEL POSITIVE AIRWAY PRESSURE    LHR 10 resp/min   Inspiratory PAP 14    Expiratory PAP 7    pH, Arterial 7.401 7.350 - 7.450   pCO2 arterial 76.4 (HH) 32.0 - 48.0 mmHg    Comment: CRITICAL RESULT CALLED TO, READ BACK BY AND VERIFIED WITH: ASHLEY BROOKER RRT, RCP BY SHANNON WOODALL RRT RCP ON 11/28/2016 AT 2344    pO2, Arterial 106 83.0 - 108.0 mmHg   Bicarbonate 46.4 (H) 20.0 - 28.0 mmol/L   Acid-Base Excess 20.2 (H) 0.0 - 2.0  mmol/L   O2 Saturation 98.0 %   Patient temperature 98.6    Collection site RIGHT RADIAL    Drawn by [id number]    Sample type ARTERIAL DRAW    Allens test (pass/fail) PASS PASS  Sodium     Status: Abnormal   Collection Time: 11/29/16 12:25 AM  Result Value Ref Range   Sodium 130 (L) 135 - 145 mmol/L  Sodium     Status: Abnormal   Collection Time: 11/29/16  2:32 AM  Result Value Ref Range   Sodium 131 (L) 135 - 145 mmol/L  Respiratory Panel by PCR     Status: None   Collection Time: 11/29/16  3:30 AM  Result Value Ref Range   Adenovirus NOT DETECTED NOT DETECTED   Coronavirus 229E NOT DETECTED NOT DETECTED   Coronavirus HKU1 NOT DETECTED NOT DETECTED   Coronavirus NL63 NOT DETECTED NOT DETECTED   Coronavirus OC43 NOT DETECTED NOT DETECTED   Metapneumovirus NOT DETECTED NOT DETECTED   Rhinovirus / Enterovirus NOT DETECTED NOT DETECTED   Influenza A NOT DETECTED NOT DETECTED   Influenza B NOT DETECTED NOT DETECTED   Parainfluenza Virus 1 NOT DETECTED NOT DETECTED   Parainfluenza Virus 2 NOT DETECTED NOT DETECTED   Parainfluenza Virus 3 NOT DETECTED NOT DETECTED   Parainfluenza Virus 4 NOT DETECTED NOT DETECTED   Respiratory Syncytial Virus NOT DETECTED NOT DETECTED   Bordetella pertussis NOT DETECTED NOT DETECTED   Chlamydophila pneumoniae NOT DETECTED NOT DETECTED   Mycoplasma pneumoniae NOT DETECTED NOT DETECTED  Basic metabolic panel     Status: Abnormal   Collection Time: 11/29/16  4:35 AM  Result Value Ref Range   Sodium 132 (L) 135 - 145 mmol/L   Potassium 3.7 3.5 - 5.1 mmol/L   Chloride 83 (L) 101 - 111 mmol/L   CO2 42 (H) 22 - 32 mmol/L   Glucose, Bld 165 (H) 65 - 99 mg/dL   BUN 14 6 - 20 mg/dL   Creatinine, Ser 0.38 (L) 0.44 - 1.00 mg/dL   Calcium 7.8 (L) 8.9 - 10.3 mg/dL   GFR calc non Af Amer >60 >60 mL/min   GFR calc Af Amer >60 >60 mL/min    Comment: (NOTE) The eGFR has been calculated using the CKD EPI equation. This calculation has not been validated in all  clinical situations. eGFR's persistently <60 mL/min signify possible Chronic Kidney Disease.    Anion gap 7 5 - 15  Magnesium     Status: None   Collection Time: 11/29/16  4:35 AM  Result Value Ref Range   Magnesium 1.8 1.7 - 2.4 mg/dL  Sodium     Status: Abnormal   Collection Time: 11/29/16  6:32 AM  Result Value Ref Range   Sodium 133 (L) 135 - 145 mmol/L  Sodium     Status: None   Collection Time: 11/29/16 10:25 AM  Result Value Ref Range  Sodium 135 135 - 145 mmol/L  Sodium     Status: None   Collection Time: 11/29/16 12:32 PM  Result Value Ref Range   Sodium 136 135 - 145 mmol/L  Sodium     Status: Abnormal   Collection Time: 11/29/16  2:32 PM  Result Value Ref Range   Sodium 133 (L) 135 - 145 mmol/L  Basic metabolic panel     Status: Abnormal   Collection Time: 11/30/16  2:12 AM  Result Value Ref Range   Sodium 134 (L) 135 - 145 mmol/L   Potassium 3.9 3.5 - 5.1 mmol/L   Chloride 83 (L) 101 - 111 mmol/L   CO2 47 (H) 22 - 32 mmol/L   Glucose, Bld 91 65 - 99 mg/dL   BUN 15 6 - 20 mg/dL   Creatinine, Ser <0.30 (L) 0.44 - 1.00 mg/dL   Calcium 8.5 (L) 8.9 - 10.3 mg/dL   GFR calc non Af Amer NOT CALCULATED >60 mL/min   GFR calc Af Amer NOT CALCULATED >60 mL/min    Comment: (NOTE) The eGFR has been calculated using the CKD EPI equation. This calculation has not been validated in all clinical situations. eGFR's persistently <60 mL/min signify possible Chronic Kidney Disease.    Anion gap 4 (L) 5 - 15  Magnesium     Status: None   Collection Time: 11/30/16  2:12 AM  Result Value Ref Range   Magnesium 2.1 1.7 - 2.4 mg/dL    Imaging: Imaging results have been reviewed Left pleural effusion. Looks better. Less interstitial edema   Tele- NSR 70s  Assessment/Plan:   Principal Problem:   Atrial fibrillation with rapid ventricular response (HCC) Active Problems:   COPD (chronic obstructive pulmonary disease) (HCC)   Hyperlipidemia   Acute on chronic respiratory  failure (HCC)   Hypoxemia   Disorientation   Acute on chronic respiratory failure with hypercapnia (HCC)   COPD exacerbation (HCC)   Pleural effusion, right   Pulmonary hypertension   Acute saddle pulmonary embolism with acute cor pulmonale (HCC)   Hyponatremia   1. Atrial fib:   I have ordered ECG to further evaluate her rhythm. Continue amio for now  2. Dyspnea:  Multifactorial - COPD, pulmonary HTN, PE. Marland Kitchen ?pneumonia   This appears to be a chronic issue.   She was at [facility] prior to coming here.      Mertie Moores, MD  11/30/2016 8:34 AM    [facility],  [address] [place], [place]  [id number] Pager [phone] Phone: [phone]; Fax: [phone]

## 2016-11-30 NOTE — Progress Notes (Signed)
RT transported pt to CT and back to 4N16. Vitals remained stable throughout.

## 2016-11-30 NOTE — Progress Notes (Signed)
  Amiodarone Drug - Drug Interaction Consult Note  Recommendations: Amiodarone is metabolized by the cytochrome P450 system and therefore has the potential to cause many drug interactions. Amiodarone has an average plasma half-life of 50 days (range 20 to 100 days). Patient is now on both amiodarone and digoxin. It is recommended that digoxin dose be cut in half. Patient was started on digoxin 0.25 mg on 12/24, today 12/26 dose was already reduced to 0.125 mg. Would follow up on digoxin level when at steady state ~5 days. Level should be drawn at least 8 hours (preferrably 12 hours) after dose for accurate level to interpret.   There is potential for drug interactions to occur several weeks or months after stopping treatment and the onset of drug interactions may be slow after initiating amiodarone.   []  Statins: Increased risk of myopathy. Simvastatin- restrict dose to 20mg  daily. Other statins: counsel patients to report any muscle pain or weakness immediately.  []  Anticoagulants: Amiodarone can increase anticoagulant effect. Consider warfarin dose reduction. Patients should be monitored closely and the dose of anticoagulant altered accordingly, remembering that amiodarone levels take several weeks to stabilize.  []  Antiepileptics: Amiodarone can increase plasma concentration of phenytoin, the dose should be reduced. Note that small changes in phenytoin dose can result in large changes in levels. Monitor patient and counsel on signs of toxicity.  []  Beta blockers: increased risk of bradycardia, AV block and myocardial depression. Sotalol - avoid concomitant use.  []   Calcium channel blockers (diltiazem and verapamil): increased risk of bradycardia, AV block and myocardial depression.  []   Cyclosporine: Amiodarone increases levels of cyclosporine. Reduced dose of cyclosporine is recommended.  [x]  Digoxin dose should be halved when amiodarone is started.  []  Diuretics: increased risk of  cardiotoxicity if hypokalemia occurs.  []  Oral hypoglycemic agents (glyburide, glipizide, glimepiride): increased risk of hypoglycemia. Patient's glucose levels should be monitored closely when initiating amiodarone therapy.   []  Drugs that prolong the QT interval:  Torsades de pointes risk may be increased with concurrent use - avoid if possible.  Monitor QTc, also keep magnesium/potassium WNL if concurrent therapy can't be avoided. Marland Kitchen. Antibiotics: e.g. fluoroquinolones, erythromycin. . Antiarrhythmics: e.g. quinidine, procainamide, disopyramide, sotalol. . Antipsychotics: e.g. phenothiazines, haloperidol.  . Lithium, tricyclic antidepressants, and methadone.  Thank You,   Sherron MondayAubrey N. Jacody Beneke, PharmD Clinical Pharmacy Resident Pager: [phone] 11/30/16 12:40 PM

## 2016-11-30 NOTE — Progress Notes (Signed)
Pt refusing Bipap at this time. Per report pt refused Bipap last night. 12/25

## 2016-11-30 NOTE — Progress Notes (Signed)
Pt assessed, states she is in pain, but refusing pain medicine at this time. Joseph ArtWoods, MD paged as pt more confused, refusing bipap now and last night (per report), refusing fluids and food. MD verbal order for Chest xray, and ABG. Pt HR 114, arterial BP 134/59, RR 27, O2 90% on 2L HFNC ( MD order for sats 88-92%). Will continue to monitor closely.  Cainan Trull N. Jamelle HaringSnow, RN

## 2016-11-30 NOTE — Progress Notes (Signed)
CRITICAL VALUE ALERT  Critical value received:  17:59  Date of notification:  11/30/2016  Time of notification:  17:59  Critical value read back:Yes.    Nurse who received alert:  Glade LloydMelissa Snow, RN  MD notified (1st page):  Face to face notification 18:01  Time of first page: face to face 18:01    Responding MD:  Carolyne Littlesurtis Woods, MD  Time MD responded:  18:01

## 2016-11-30 NOTE — Progress Notes (Signed)
ABG critical values given to Dr. Joseph ArtWoods.

## 2016-11-30 NOTE — Progress Notes (Signed)
Physical Therapy Evaluation Patient Details Name: Shannon Jackson MRN: [id number] DOB: 30-Jun-1946 Today's Date: 11/30/2016   History of Present Illness  Pt came to the ED with generalized weakness for past 2 days. Patient was seen [facility] couple of weeks ago and diagnosed with atrial fibrillation and failure to thrive at that time she was admitted to ICU and had several episodes of atrial fibrillation. [facility] and [place] care yesterday (12/25).Patient signed herself out AMA from [facility] on 11/21/2016 as she was not happy with the care provided at [facility].  Clinical Impression  PTA, pt was independent with all ADLs and community mobility without assistive devices. Evaluation limited 2/2 Bi-pap. Pt reports that she currently lives alone and does not have anyone to help at discharge. Pt currently requires min assist for bed mobility and was not able to tolerate EOB for longer than 2 minutes 2/2 need for BM. Pt was assisted onto bed pan but was not able to have BM. Performed rolling x5 with min assist. Pt will benefit from SNF following d/c from hospital to assist with deficits in strength and mobility before return to previous lifestyle. PT will continue to follow acutely.    Follow Up Recommendations SNF;Supervision/Assistance - 24 hour    Equipment Recommendations  Other (comment);Rolling walker with 5" wheels (unclear if pt already owns)    Recommendations for Other Services       Precautions / Restrictions Precautions Precautions: Fall Restrictions Weight Bearing Restrictions: No      Mobility  Bed Mobility Overal bed mobility: Needs Assistance Bed Mobility: Rolling;Sidelying to Sit Rolling: Min assist Sidelying to sit: Min assist       General bed mobility comments: Pt able to roll x5 to change bed pan with min assist to maintain rolled position while bed pan was  changed. Pt required min assist for LE mobility out of bed and trunk upright for supine<>sit.  Transfers                    Ambulation/Gait                Stairs            Wheelchair Mobility    Modified Rankin (Stroke Patients Only)       Balance Overall balance assessment: Needs assistance Sitting-balance support: Bilateral upper extremity supported;Feet unsupported Sitting balance-Leahy Scale: Poor  Pt sat EOB 1 1/2 minutes with min guard assist.                                        Pertinent Vitals/Pain Pain Assessment: Faces Faces Pain Scale: Hurts little more         HR 115, SpO2 96% throughout, 134/59 initially; 161/58 at end of session.   Home Living Family/patient expects to be discharged to:: Skilled nursing facility Living Arrangements: Alone     Home Access: Stairs to enter   Entrance Stairs-Number of Steps: 3 Home Layout: One level        Prior Function Level of Independence: Independent               Hand Dominance        Extremity/Trunk Assessment   Upper Extremity Assessment Upper Extremity Assessment: Generalized weakness    Lower Extremity Assessment Lower Extremity Assessment: Generalized  weakness       Communication   Communication: No difficulties (Difficult to understand 2/2 Bi-PAP )  Cognition Arousal/Alertness: Lethargic Behavior During Therapy: WFL for tasks assessed/performed Overall Cognitive Status: Within Functional Limits for tasks assessed                      General Comments      Exercises     Assessment/Plan    PT Assessment Patient needs continued PT services  PT Problem List Decreased strength;Decreased range of motion;Decreased activity tolerance;Decreased balance;Decreased mobility;Decreased knowledge of use of DME;Decreased safety awareness;Decreased knowledge of precautions;Pain          PT Treatment Interventions DME instruction;Gait  training;Therapeutic activities;Therapeutic exercise;Functional mobility training;Balance training;Patient/family education    PT Goals (Current goals can be found in the Care Plan section)  Acute Rehab PT Goals Patient Stated Goal: to get better PT Goal Formulation: With patient Time For Goal Achievement: 12/14/16 Potential to Achieve Goals: Fair    Frequency Min 3X/week   Barriers to discharge        Co-evaluation               End of Session   Activity Tolerance: Patient limited by fatigue;Patient limited by lethargy Patient left: in bed;with call bell/phone within reach;with nursing/sitter in room Nurse Communication: Mobility status;Other (comment) (RN notified of wound on L hand and pt requesting to call son)         Time: 1017-1040 PT Time Calculation (min) (ACUTE ONLY): 23 min   Charges:   PT Evaluation $PT Eval Moderate Complexity: 1 Procedure PT Treatments $Therapeutic Activity: 8-22 mins   PT G Codes:        Gaye PollackRebecca Kim 11/30/2016, 11:52 AM Gaye Pollackebecca Kim, SPT [phone] I have read, reviewed and agree with student's note.   [facility] Verlisa Vara,PT Acute Rehabilitation [phone] (pager)

## 2016-11-30 NOTE — Progress Notes (Signed)
Pt refused to stay on CPT - says she is in pain. RN notified.

## 2016-11-30 NOTE — Consult Note (Addendum)
[facility] Consult   Reason for Consult:  depression Referring Physician:  Dr. Dia Crawford Patient Identification: Shannon Jackson MRN:  [id number] Principal Diagnosis: Atrial fibrillation with rapid ventricular response [facility]) Diagnosis:   Patient Active Problem List   Diagnosis Date Noted  . Major depressive disorder with single episode, in partial remission ([place]) [F32.4]   . Personal history of noncompliance with medical treatment, presenting hazards to health [Z91.19]   . Respiratory acidosis [E87.2]   . Pleural effusion [J90]   . Disorientation [R41.0]   . Acute on chronic respiratory failure with hypercapnia (HCC) [J96.22]   . COPD exacerbation ([place]) [J44.1]   . Pleural effusion, right [J90]   . Pulmonary hypertension [I27.20]   . Acute saddle pulmonary embolism with acute cor pulmonale (HCC) [I26.02]   . Hyponatremia [E87.1]   . Hypoxemia [R09.02]   . Acute on chronic respiratory failure ([place]) [J96.20] 11/25/2016  . Atrial fibrillation with rapid ventricular response ([place]) [I48.91] 11/23/2016  . Atrial fibrillation with RVR ([place]) [I48.91] 11/23/2016  . Osteoporosis [M81.0] 09/22/2016  . Allergic rhinitis [477]   . Arthritis [M19.90]   . Osteopenia [M85.80]   . Hyperlipidemia [E78.5]   . Raynaud's disease [I73.00] 06/28/2013  . COPD (chronic obstructive pulmonary disease) ([place]) [J44.9] 11/10/2011    Total Time spent with patient: 45 minutes  Subjective/HPI:   Shannon Jackson is a 70 y.o. female patient with depression, COPD, PE, Afib who was admitted for generalized weakness. Patient was found to have Afib with RVR. Hospital course is complicated by hyponatremia, acute on chronic hypercarbic respiratory failure, right pleural effusion. Psychiatry is consulted for severe depression and medication management. Noted that her home dose of sertraline 100 mg was discontinued by the team with concern for hyponatremia (Serum Osm 271, Urine Osm 473, Urine Na <10)   - per  team's report, patient is alert, oriented and no significant signs of delirium. The team is concerned that patient occasionally declines treatment and has decreased PO intake. - per chart review, "Per son at bedside, pt was seen at [facility] a couple weeks ago and diagnosed with A-fib and failure to thrive. She was admitted to the ICU and had several episodes of the atrial fibrillation.  Pt states she has not been able to ambulate since admitted for A-fib. She was sent to [facility] after being discharged for rehabilitation and was supposed to be sent to [facility] [place] for long-term care yesterday. Pt signed herself out of [facility] on 11/21/16 even though she is still weak. Pt reports that they were not treating her well over at the facility and prescribed her medications that caused her to hallucinate. Son states family has been staying with her 24/7 since she left [facility] and she isn't improving.  Pt is regularly on 2L oxygen at home for her COPD. Prior to her A-fib diagnosis, she lived at home by herself."  Patient is a poor historian due to occasional confusion and it took time for her to answer each questions (or ended up saying nothing).  Patient states that she feels "not good." She is unable to elaborate it. She does not answer questions when she is asked of refusing treatment earlier. She is unsure if she would feel better is she does not have medical condition. When she was asked about her hobby, she states "breathing." She slept some last night. She denies SI/HI/AH/VH.   Patient sister is at the bedside.  [name] appears to be confused when she was offered medication which  she does not know. She used to be independent and she was doing much better two weeks ago. The sister believes that lugene was doing better earlier this morning, and was talking a little more. The sister is not aware of any SI or hallucinations.   Past Psychiatric History:  Outpatient: sees a psychiatrist, details  unknown Psychiatry admission: denies Previous suicide attempt: denies Past trials of medication: sertraline History of violence: denies  Risk to Self: Is patient at risk for suicide?: No Risk to Others:   Prior Inpatient Therapy:   Prior Outpatient Therapy:    Past Medical History:  Past Medical History:  Diagnosis Date  . A-fib ([place])   . Allergic rhinitis   . Arthritis    left hip  . Cataract   . COPD (chronic obstructive pulmonary disease) ([place])   . Failure to thrive in adult   . GERD (gastroesophageal reflux disease)   . Hyperlipidemia   . Osteopenia   . Osteoporosis 08/2016  . Pulmonary embolism ([place])   . Raynaud's syndrome     Past Surgical History:  Procedure Laterality Date  . FOOT NEUROMA SURGERY    . TUBAL LIGATION     Family History:  Family History  Problem Relation Age of Onset  . Melanoma Mother   . Emphysema Father   . Arthritis Father   . Cancer Brother     skin, melanoma   Family Psychiatric  History: denies Social History:  History  Alcohol Use No     History  Drug Use No    Social History   Social History  . Marital status: Widowed    Spouse name: N/A  . Number of children: N/A  . Years of education: N/A   Occupational History  . retired    Social History Main Topics  . Smoking status: Former Smoker    Packs/day: 1.00    Years: 25.00    Types: Cigarettes    Quit date: 12/06/2006  . Smokeless tobacco: Never Used  . Alcohol use No  . Drug use: No  . Sexual activity: Not Currently    Birth control/ protection: Post-menopausal   Other Topics Concern  . None   Social History Narrative  . None   Additional Social History:    Allergies:   Allergies  Allergen Reactions  . Codeine Nausea And Vomiting  . Levofloxacin     IV form caused swelling in hand, itching and burning.  . Lovenox [Enoxaparin Sodium]     Per EMS    Labs:  Results for orders placed or performed during the hospital encounter of 11/22/16 (from the  past 48 hour(s))  Ammonia     Status: None   Collection Time: 11/28/16  4:18 PM  Result Value Ref Range   Ammonia 27 9 - 35 umol/L  Osmolality, urine     Status: None   Collection Time: 11/28/16  4:30 PM  Result Value Ref Range   Osmolality, Ur 473 300 - 900 mOsm/kg  Osmolality     Status: Abnormal   Collection Time: 11/28/16  4:30 PM  Result Value Ref Range   Osmolality 271 (L) 275 - 295 mOsm/kg  Na and K (sodium & potassium), rand urine     Status: None   Collection Time: 11/28/16  4:30 PM  Result Value Ref Range   Sodium, Ur <10 mmol/L   Potassium Urine 14 mmol/L  Blood gas, arterial     Status: Abnormal   Collection Time: 11/28/16  4:30 PM  Result Value Ref Range   O2 Content 3.0 L/min   Delivery systems NASAL CANNULA    pH, Arterial 7.311 (L) 7.350 - 7.450   pCO2 arterial 96.8 (HH) 32.0 - 48.0 mmHg    Comment: CRITICAL RESULT CALLED TO, READ BACK BY AND VERIFIED WITH: KIM GROENDAL, RRT AT 1642 BY C.HALL,RRT ON 11/28/2016    pO2, Arterial 169 (H) 83.0 - 108.0 mmHg   Bicarbonate 47.8 (H) 20.0 - 28.0 mmol/L   Acid-Base Excess 20.5 (H) 0.0 - 2.0 mmol/L   O2 Saturation 99.3 %   Patient temperature 97.7    Collection site A-LINE    Drawn by [id number]    Sample type ARTERIAL DRAW   Magnesium     Status: None   Collection Time: 11/28/16  4:30 PM  Result Value Ref Range   Magnesium 2.0 1.7 - 2.4 mg/dL  CBC     Status: Abnormal   Collection Time: 11/28/16  4:30 PM  Result Value Ref Range   WBC 10.6 (H) 4.0 - 10.5 K/uL   RBC 2.73 (L) 3.87 - 5.11 MIL/uL   Hemoglobin 8.5 (L) 12.0 - 15.0 g/dL   HCT 25.9 (L) 36.0 - 46.0 %   MCV 94.9 78.0 - 100.0 fL   MCH 31.1 26.0 - 34.0 pg   MCHC 32.8 30.0 - 36.0 g/dL   RDW 12.5 11.5 - 15.5 %   Platelets 151 150 - 400 K/uL  Comprehensive metabolic panel     Status: Abnormal   Collection Time: 11/28/16  4:30 PM  Result Value Ref Range   Sodium 127 (L) 135 - 145 mmol/L   Potassium 3.2 (L) 3.5 - 5.1 mmol/L   Chloride 76 (L) 101 - 111 mmol/L    CO2 42 (H) 22 - 32 mmol/L   Glucose, Bld 149 (H) 65 - 99 mg/dL   BUN 11 6 - 20 mg/dL   Creatinine, Ser 0.35 (L) 0.44 - 1.00 mg/dL   Calcium 8.2 (L) 8.9 - 10.3 mg/dL   Total Protein 4.2 (L) 6.5 - 8.1 g/dL   Albumin 2.6 (L) 3.5 - 5.0 g/dL   AST 27 15 - 41 U/L   ALT 51 14 - 54 U/L   Alkaline Phosphatase 61 38 - 126 U/L   Total Bilirubin 0.5 0.3 - 1.2 mg/dL   GFR calc non Af Amer >60 >60 mL/min   GFR calc Af Amer >60 >60 mL/min    Comment: (NOTE) The eGFR has been calculated using the CKD EPI equation. This calculation has not been validated in all clinical situations. eGFR's persistently <60 mL/min signify possible Chronic Kidney Disease.    Anion gap 9 5 - 15  Uric acid     Status: Abnormal   Collection Time: 11/28/16  4:30 PM  Result Value Ref Range   Uric Acid, Serum 1.2 (L) 2.3 - 6.6 mg/dL  Sodium     Status: Abnormal   Collection Time: 11/28/16  6:32 PM  Result Value Ref Range   Sodium 123 (L) 135 - 145 mmol/L  Sodium     Status: Abnormal   Collection Time: 11/28/16 10:32 PM  Result Value Ref Range   Sodium 125 (L) 135 - 145 mmol/L  Blood gas, arterial     Status: Abnormal   Collection Time: 11/28/16 11:38 PM  Result Value Ref Range   FIO2 0.40    Delivery systems BILEVEL POSITIVE AIRWAY PRESSURE    LHR 10 resp/min   Inspiratory PAP 14  Expiratory PAP 7    pH, Arterial 7.401 7.350 - 7.450   pCO2 arterial 76.4 (HH) 32.0 - 48.0 mmHg    Comment: CRITICAL RESULT CALLED TO, READ BACK BY AND VERIFIED WITH: ASHLEY BROOKER RRT, RCP BY SHANNON WOODALL RRT RCP ON 11/28/2016 AT 2344    pO2, Arterial 106 83.0 - 108.0 mmHg   Bicarbonate 46.4 (H) 20.0 - 28.0 mmol/L   Acid-Base Excess 20.2 (H) 0.0 - 2.0 mmol/L   O2 Saturation 98.0 %   Patient temperature 98.6    Collection site RIGHT RADIAL    Drawn by [id number]    Sample type ARTERIAL DRAW    Allens test (pass/fail) PASS PASS  Sodium     Status: Abnormal   Collection Time: 11/29/16 12:25 AM  Result Value Ref Range    Sodium 130 (L) 135 - 145 mmol/L  Sodium     Status: Abnormal   Collection Time: 11/29/16  2:32 AM  Result Value Ref Range   Sodium 131 (L) 135 - 145 mmol/L  Respiratory Panel by PCR     Status: None   Collection Time: 11/29/16  3:30 AM  Result Value Ref Range   Adenovirus NOT DETECTED NOT DETECTED   Coronavirus 229E NOT DETECTED NOT DETECTED   Coronavirus HKU1 NOT DETECTED NOT DETECTED   Coronavirus NL63 NOT DETECTED NOT DETECTED   Coronavirus OC43 NOT DETECTED NOT DETECTED   Metapneumovirus NOT DETECTED NOT DETECTED   Rhinovirus / Enterovirus NOT DETECTED NOT DETECTED   Influenza A NOT DETECTED NOT DETECTED   Influenza B NOT DETECTED NOT DETECTED   Parainfluenza Virus 1 NOT DETECTED NOT DETECTED   Parainfluenza Virus 2 NOT DETECTED NOT DETECTED   Parainfluenza Virus 3 NOT DETECTED NOT DETECTED   Parainfluenza Virus 4 NOT DETECTED NOT DETECTED   Respiratory Syncytial Virus NOT DETECTED NOT DETECTED   Bordetella pertussis NOT DETECTED NOT DETECTED   Chlamydophila pneumoniae NOT DETECTED NOT DETECTED   Mycoplasma pneumoniae NOT DETECTED NOT DETECTED  Basic metabolic panel     Status: Abnormal   Collection Time: 11/29/16  4:35 AM  Result Value Ref Range   Sodium 132 (L) 135 - 145 mmol/L   Potassium 3.7 3.5 - 5.1 mmol/L   Chloride 83 (L) 101 - 111 mmol/L   CO2 42 (H) 22 - 32 mmol/L   Glucose, Bld 165 (H) 65 - 99 mg/dL   BUN 14 6 - 20 mg/dL   Creatinine, Ser 0.38 (L) 0.44 - 1.00 mg/dL   Calcium 7.8 (L) 8.9 - 10.3 mg/dL   GFR calc non Af Amer >60 >60 mL/min   GFR calc Af Amer >60 >60 mL/min    Comment: (NOTE) The eGFR has been calculated using the CKD EPI equation. This calculation has not been validated in all clinical situations. eGFR's persistently <60 mL/min signify possible Chronic Kidney Disease.    Anion gap 7 5 - 15  Magnesium     Status: None   Collection Time: 11/29/16  4:35 AM  Result Value Ref Range   Magnesium 1.8 1.7 - 2.4 mg/dL  Sodium     Status: Abnormal    Collection Time: 11/29/16  6:32 AM  Result Value Ref Range   Sodium 133 (L) 135 - 145 mmol/L  Sodium     Status: None   Collection Time: 11/29/16 10:25 AM  Result Value Ref Range   Sodium 135 135 - 145 mmol/L  Sodium     Status: None   Collection Time: 11/29/16 12:32  PM  Result Value Ref Range   Sodium 136 135 - 145 mmol/L  Sodium     Status: Abnormal   Collection Time: 11/29/16  2:32 PM  Result Value Ref Range   Sodium 133 (L) 135 - 145 mmol/L  Basic metabolic panel     Status: Abnormal   Collection Time: 11/30/16  2:12 AM  Result Value Ref Range   Sodium 134 (L) 135 - 145 mmol/L   Potassium 3.9 3.5 - 5.1 mmol/L   Chloride 83 (L) 101 - 111 mmol/L   CO2 47 (H) 22 - 32 mmol/L   Glucose, Bld 91 65 - 99 mg/dL   BUN 15 6 - 20 mg/dL   Creatinine, Ser <0.30 (L) 0.44 - 1.00 mg/dL   Calcium 8.5 (L) 8.9 - 10.3 mg/dL   GFR calc non Af Amer NOT CALCULATED >60 mL/min   GFR calc Af Amer NOT CALCULATED >60 mL/min    Comment: (NOTE) The eGFR has been calculated using the CKD EPI equation. This calculation has not been validated in all clinical situations. eGFR's persistently <60 mL/min signify possible Chronic Kidney Disease.    Anion gap 4 (L) 5 - 15  Magnesium     Status: None   Collection Time: 11/30/16  2:12 AM  Result Value Ref Range   Magnesium 2.1 1.7 - 2.4 mg/dL  Blood gas, arterial     Status: Abnormal   Collection Time: 11/30/16  8:23 AM  Result Value Ref Range   O2 Content 2.0 L/min   Delivery systems NASAL CANNULA    pH, Arterial 7.310 (L) 7.350 - 7.450   pCO2 arterial 102 (HH) 32.0 - 48.0 mmHg    Comment: CRITICAL RESULT CALLED TO, READ BACK BY AND VERIFIED WITH: SUE PALMER,RRT,RCP AT 0840 BY M.SMITH,RRT,RCP ON 11/30/16    pO2, Arterial 64.9 (L) 83.0 - 108.0 mmHg   Bicarbonate 50.1 (H) 20.0 - 28.0 mmol/L   Acid-Base Excess 22.7 (H) 0.0 - 2.0 mmol/L   O2 Saturation 92.9 %   Patient temperature 97.8    Collection site A-LINE    Drawn by [id number]    Sample type  ARTERIAL DRAW    Allens test (pass/fail) PASS PASS    Current Facility-Administered Medications  Medication Dose Route Frequency Provider Last Rate Last Dose  . 0.9 %  sodium chloride infusion   Intra-arterial PRN Rise Patience, MD      . amiodarone (NEXTERONE PREMIX) 360-4.14 MG/200ML-% (1.8 mg/mL) IV infusion  30 mg/hr Intravenous Continuous Rolland Porter, MD 16.7 mL/hr at 11/30/16 0700 30 mg/hr at 11/30/16 0700  . budesonide (PULMICORT) nebulizer solution 0.5 mg  0.5 mg Nebulization BID Rigoberto Noel, MD   0.5 mg at 11/30/16 0729  . ceFEPIme (MAXIPIME) 1 g in dextrose 5 % 50 mL IVPB  1 g Intravenous Q12H Allie Bossier, MD   1 g at 11/30/16 0607  . digoxin (LANOXIN) 0.25 MG/ML injection 0.125 mg  0.125 mg Intravenous Daily Arnoldo Lenis, MD   0.125 mg at 11/30/16 7564  . feeding supplement (ENSURE ENLIVE) (ENSURE ENLIVE) liquid 237 mL  237 mL Oral BID BM Belkys A Regalado, MD   237 mL at 11/29/16 1006  . ipratropium (ATROVENT) nebulizer solution 0.5 mg  0.5 mg Nebulization Q6H Belkys A Regalado, MD   0.5 mg at 11/30/16 0713  . levalbuterol (XOPENEX) nebulizer solution 0.63 mg  0.63 mg Nebulization Q6H PRN Oswald Hillock, MD   0.63 mg at 11/25/16 0752  .  levalbuterol (XOPENEX) nebulizer solution 0.63 mg  0.63 mg Nebulization Q6H Belkys A Regalado, MD   0.63 mg at 11/30/16 0714  . LORazepam (ATIVAN) injection 0.5-1 mg  0.5-1 mg Intravenous Q6H PRN Belkys A Regalado, MD   1 mg at 11/28/16 1100  . methylPREDNISolone sodium succinate (SOLU-MEDROL) 40 mg/mL injection 30 mg  30 mg Intravenous Q24H Allie Bossier, MD      . morphine 2 MG/ML injection 1-2 mg  1-2 mg Intravenous Q2H PRN Allie Bossier, MD   2 mg at 11/30/16 1134  . multivitamin with minerals tablet 1 tablet  1 tablet Oral Daily Belkys A Regalado, MD   1 tablet at 11/30/16 0919  . ondansetron (ZOFRAN) tablet 4 mg  4 mg Oral Q6H PRN Oswald Hillock, MD       Or  . ondansetron (ZOFRAN) injection 4 mg  4 mg Intravenous Q6H PRN Oswald Hillock, MD   4 mg at 11/26/16 1659  . pantoprazole (PROTONIX) EC tablet 40 mg  40 mg Oral Daily Oswald Hillock, MD   40 mg at 11/30/16 0919  . rivaroxaban (XARELTO) tablet 20 mg  20 mg Oral Daily Oswald Hillock, MD   20 mg at 11/30/16 0919  . sodium chloride 0.9 % bolus 250 mL  250 mL Intravenous Once Velvet Bathe, MD      . sodium chloride flush (NS) 0.9 % injection 10-40 mL  10-40 mL Intracatheter Q12H Velvet Bathe, MD   10 mL at 11/30/16 0921  . sodium chloride flush (NS) 0.9 % injection 10-40 mL  10-40 mL Intracatheter PRN Velvet Bathe, MD   10 mL at 11/30/16 0921    Musculoskeletal: Strength & Muscle Tone: decreased Gait & Station: unable to assess, patient lying in the bed Patient leans: N/A  Psychiatric Specialty Exam: Physical Exam  Review of Systems  Unable to perform ROS: Acuity of condition    Blood pressure 126/71, pulse (!) 122, temperature 97 F (36.1 C), temperature source Axillary, resp. rate (!) 21, height '5\' 6"'$  (1.676 m), weight 139 lb 5.3 oz (63.2 kg), SpO2 92 %.Body mass index is 22.49 kg/m.  General Appearance: Fairly Groomed  Eye Contact:  None- closing her eyes  Speech:  Slurred  Volume:  Decreased  Mood:  "not good"  Affect:  Restricted  Thought Process:  Coherent, disorganized at times  Orientation:  Other:  "2020, [place]" unable to tell month, date.   Thought Content:  no paranoia Perceptions: denies AH/VH  Suicidal Thoughts:  No  Homicidal Thoughts:  No  Memory:  Immediate;   Poor Recent;   Poor Remote;   Poor  Judgement:  Poor  Insight:  Lacking  Psychomotor Activity:  Decreased  Concentration:  Concentration: Poor and Attention Span: Poor  Recall:  Poor  Fund of Knowledge:  difficult to assess due to her current mental status  Language:  difficult to assess due to her current mental status  Akathisia:  difficult to assess due to her current mental status  Handed:    AIMS (if indicated):     Assets:  Social Support  ADL's:  Impaired  Cognition:   Impaired,  Mild  Sleep:   fair   Assessment  Bernadene Garside is a 70 y.o. female patient with depression, COPD, PE, Afib who was admitted for generalized weakness. Patient was found to have Afib with RVR. Hospital course is complicated by hyponatremia, acute on chronic hypercarbic respiratory failure, right pleural effusion. Psychiatry is  consulted for severe depression and medication management given her refusal of treatment and decreased PO intake. Noted that her home dose of sertraline 100 mg was discontinued by the team with concern for hyponatremia (Serum Osm 271, Urine Osm 473, Urine Na <10).   # Delirium The patient's exam is notable for altered sensorium, disorientation and cognitive deficits that appear markedly different than their baseline, suggesting a diagnosis of delirium. It is unclear whether this is secondary to patient given morphine two hours prior to the interview. Virtually any medical condition or physiologic stress can precipitate delirium in a susceptible individual, with risk increasing in those with: advanced age, major chronic medical issues, prolonged hospitalizations,anemia, electrolyte abnormality, hypoxia, insomnia/disturbed sleep, and severe pain. Addressing the underlying medical condition and institution of preventative measures are recommended. Given her current mental status, it is difficult to assess her depression nor make any recommendation for psychotropics; will continue to evaluate this patient. Noted that patient has had no significant behavioral issues to warrant prn psychotropics at this time.  - Check TSH  - Continue to monitor and treat underlying medical causes of delirium, including infection, electrolyte disturbances, etc. - Delirium precautions - Minimize/avoid deliriogenic meds including: anticholinergic, opiates, benzodiazepines           - Maintain hydration, oxygenation, nutrition           - Limit use of restraints and catheters           -  Normalize sleep patterns by minimizing nighttime noise, light and interruptions by                clustering care, opening blinds during the day           - Reorient the patient frequently, provide easily visible clock and calendar           - Provide sensory aids like glasses, hearing aids           - Encourage ambulation, regular activities and visitors to maintain cognitive stimulation   Treatment Plan Summary: Daily contact with patient to assess and evaluate symptoms and progress in treatment  Disposition: Will plan to evaluate the patient again tomorrow.  Norman Clay, MD 11/30/2016 1:57 PM

## 2016-12-01 DIAGNOSIS — Z7189 Other specified counseling: Secondary | ICD-10-CM

## 2016-12-01 DIAGNOSIS — J90 Pleural effusion, not elsewhere classified: Secondary | ICD-10-CM

## 2016-12-01 DIAGNOSIS — I959 Hypotension, unspecified: Secondary | ICD-10-CM

## 2016-12-01 DIAGNOSIS — J9601 Acute respiratory failure with hypoxia: Secondary | ICD-10-CM

## 2016-12-01 DIAGNOSIS — J439 Emphysema, unspecified: Secondary | ICD-10-CM

## 2016-12-01 LAB — BASIC METABOLIC PANEL
ANION GAP: 9 (ref 5–15)
BUN: 13 mg/dL (ref 6–20)
CALCIUM: 8.2 mg/dL — AB (ref 8.9–10.3)
CO2: 50 mmol/L — ABNORMAL HIGH (ref 22–32)
Chloride: 75 mmol/L — ABNORMAL LOW (ref 101–111)
Creatinine, Ser: 0.35 mg/dL — ABNORMAL LOW (ref 0.44–1.00)
Glucose, Bld: 128 mg/dL — ABNORMAL HIGH (ref 65–99)
POTASSIUM: 3.4 mmol/L — AB (ref 3.5–5.1)
SODIUM: 134 mmol/L — AB (ref 135–145)

## 2016-12-01 LAB — MAGNESIUM: MAGNESIUM: 1.9 mg/dL (ref 1.7–2.4)

## 2016-12-01 LAB — GLUCOSE, CAPILLARY: GLUCOSE-CAPILLARY: 118 mg/dL — AB (ref 65–99)

## 2016-12-01 MED ORDER — AMIODARONE HCL IN DEXTROSE 360-4.14 MG/200ML-% IV SOLN
30.0000 mg/h | INTRAVENOUS | Status: DC
  Administered 2016-12-01 – 2016-12-03 (×4): 30 mg/h via INTRAVENOUS
  Filled 2016-12-01 (×4): qty 200

## 2016-12-01 MED ORDER — AMIODARONE HCL IN DEXTROSE 360-4.14 MG/200ML-% IV SOLN
60.0000 mg/h | INTRAVENOUS | Status: AC
  Administered 2016-12-01: 60 mg/h via INTRAVENOUS
  Filled 2016-12-01: qty 200

## 2016-12-01 MED ORDER — SODIUM CHLORIDE 0.9 % IV SOLN
30.0000 meq | Freq: Once | INTRAVENOUS | Status: AC
  Administered 2016-12-01: 30 meq via INTRAVENOUS
  Filled 2016-12-01: qty 15

## 2016-12-01 NOTE — Progress Notes (Signed)
PULMONARY / CRITICAL CARE MEDICINE   Name: Shannon Jackson MRN: [id number] DOB: [birth year]    ADMISSION DATE:  11/22/2016 CONSULTATION DATE:  11/27/16  REFERRING MD:  Dr. Cena BentonVega   CHIEF COMPLAINT:  SOB, generalized weakness  HISTORY OF PRESENT ILLNESS:   70 y/o F with PMH of arthritis, osteoporosis, GERD, HLD, AF (on Xarelto), prior PE, Raynaud's syndrome, concern for failure to thrive & COPD who presented to [facility] on 12/18 with generalized weakness.    [facility] weeks prior to admit and diagnosed with atrial fibrillation and failure to thrive.  She was [facility] and signed herself out AMA on 11/21/16 as she was unhappy with the care provided at [facility].  She returend home and continued to have generalized weakness, mild SOB and returned for evaluation on 12/18 and was found to be in AF with RVR.  She was admitted by [facility] for [facility] evaluation.  She was treated with IV amiodarone gtt for rate control.  There was concern for LLL consolidation and she was treated empirically for PNA.  The patient developed worsening respiratory distress and was placed on BiPAP.    [facility]/23.     SUBJECTIVE:  Patient has been on BiPAP for support of work of breathing as well as treatment of acute on chronic hypercarbia. Patient reports significant ongoing dyspnea. Denies any pain or nausea with a head nod.  REVIEW OF SYSTEMS: Unable to obtain given BiPAP requirement.  VITAL SIGNS: BP 120/67   Pulse (!) 120   Temp 98 F (36.7 C) (Axillary)   Resp 19   Ht 5\' 6"  (1.676 m)   Wt 135 lb 5.8 oz (61.4 kg)   SpO2 99%   BMI 21.85 kg/m   HEMODYNAMICS:    VENTILATOR SETTINGS: FiO2 (%):  [40 %] 40 %  INTAKE / OUTPUT: I/O last 3 completed shifts: In: 807 [P.O.:120; I.V.:587; IV Piggyback:100] Out: 5455 [Urine:5455]  PHYSICAL EXAMINATION: General:  Elderly female. Frail. No family at bedside.   Neuro:  Following commands. Nods to questions. Grossly nonfocal. HEENT:  BiPAP mask in place. No scleral icterus or injection. Cardiovascular:  Tachycardic with irregular rhythm. Appears to be in atrial fibrillation on telemetry. No edema. Pulmonary: Decreased breath sounds bilateral lung bases. Mildly increased work of breathing on BiPAP support. Abdomen:  Soft. Nondistended. Nontender. Integument: Warm and dry. No rash on exposed skin.  LABS:  BMET  Recent Labs Lab 11/29/16 0435  11/29/16 1432 11/30/16 0212 12/01/16 0434  NA 132*  < > 133* 134* 134*  K 3.7  --   --  3.9 3.4*  CL 83*  --   --  83* 75*  CO2 42*  --   --  47* 50*  BUN 14  --   --  15 13  CREATININE 0.38*  --   --  <0.30* 0.35*  GLUCOSE 165*  --   --  91 128*  < > = values in this interval not displayed.  Electrolytes  Recent Labs Lab 11/29/16 0435 11/30/16 0212 12/01/16 0434  CALCIUM 7.8* 8.5* 8.2*  MG 1.8 2.1 1.9    CBC  Recent Labs Lab 11/27/16 0045 11/28/16 0900 11/28/16 1630  WBC 10.5 10.1 10.6*  HGB 9.4* 8.4* 8.5*  HCT 28.5* 25.9* 25.9*  PLT 135* 152 151    Coag's No results for input(s): APTT, INR in the last 168 hours.  Sepsis Markers  Recent Labs Lab 11/27/16 0045  LATICACIDVEN 0.8    ABG  Recent Labs Lab 11/28/16 1630 11/28/16 2338 11/30/16 0823  PHART 7.311* 7.401 7.310*  PCO2ART 96.8* 76.4* 102*  PO2ART 169* 106 64.9*    Liver Enzymes  Recent Labs Lab 11/28/16 1630  AST 27  ALT 51  ALKPHOS 61  BILITOT 0.5  ALBUMIN 2.6*    Cardiac Enzymes  Recent Labs Lab 11/27/16 0045  TROPONINI <0.03    Glucose No results for input(s): GLUCAP in the last 168 hours.  Imaging Ct Chest Wo Contrast  Result Date: 11/30/2016 CLINICAL DATA:  Shortness of Breath EXAM: CT CHEST WITHOUT CONTRAST TECHNIQUE: Multidetector CT imaging of the chest was performed following the standard protocol without IV contrast. COMPARISON:  Chest x-ray from earlier in the same day  FINDINGS: Cardiovascular: Somewhat limited by a lack of IV contrast. Aortic calcifications are seen without aneurysmal dilatation. Cardiac structures are within normal limits. No significant coronary calcifications are noted. Mediastinum/Nodes: Thoracic inlet is within normal limits. No hilar or mediastinal adenopathy is seen. Some calcified mediastinal lymph nodes are noted consistent with prior granulomatous disease. Lungs/Pleura: Bilateral pleural effusions are seen left greater than right similar to that noted on recent plain film examination. Left lower lobe consolidation is seen. Additionally there is some patchy rounded density in both the anterior aspect of the right upper lobe on image number 77 of series 3 and in the anterior aspect of the left upper lobe on image number 70 of series 3. These changes were not present on the prior exam. Upper Abdomen: Mild ascites is noted but incompletely evaluated on this exam. No other upper abdominal abnormality is seen. Musculoskeletal: Chronic compression deformity of T5 is noted. IMPRESSION: Bilateral pleural effusions left greater than right with left lower lobe consolidation. Nodular areas of increased density which may simply represent infiltrate although the possibility of mass lesion could not be totally excluded on the basis of this exam. Short-term followup following appropriate therapy is recommended this would be best accomplished with additional noncontrast CT scan. Electronically Signed   By: Alcide Clever M.D.   On: 11/30/2016 14:52    STUDIES:  TTE 11/24/16: Mild LVH with EF 60%. No regional wall motion abnormalities. Normal diastolic function. LA normal in size & RA mildly dilated. RV mildly dilated with preserved systolic function. Pulmonary artery systolic pressure 60 mmHg. No aortic stenosis or regurgitation. Aortic root normal in size. No mitral stenosis or regurgitation. No pulmonic regurgitation. Moderate tricuspid regurgitation. No pericardial  effusion. CT CHEST W/O 11/30/16:  Personally reviewed by me. Bilateral pleural effusions left greater than right with some suggestion of layering on the left. Adjacent left lower lobe consolidation noted. Patchy nodularity bilaterally. No pathologic mediastinal adenopathy. Some mild calcification and mediastinal lymph nodes. No pericardial effusion.  MICROBIOLOGY: MRSA PCR 12/19:  Negative Blood Ctx x2 12/19:  Negative MRSA PCR 12/23:  Negative  Respiratory Panel PCR 12/25:  Negative   ANTIBIOTICS: Cefepime 12/19 - 12/26 Vancomycin 12/19 - 12/25  SIGNIFICANT EVENTS: 12/18 - Admit to [facility] for weakness, found to be in AF with RVR, ? PNA.  BiPAP 12/23 - Tx to [facility] for further eval 12/24 - Tx to [facility] 12/27 - re-consult with work of breathing  LINES/TUBES: RUE TL PICC 12/22 (placed AP/OSH) >>  ASSESSMENT / PLAN:  70 y.o. female admitted to outside hospital with weakness and shortness of breath. Patient found to be in atrial fibrillation with rapid ventricular response. Antibiotics were initiated  for possible pneumonia. Patient was subsequently transferred on 12/24 to our facility for further evaluation and treatment. Patient continues to have acute on chronic hypercarbic respiratory failure and acute hypoxic respiratory failure. Degree of dyspnea at this time is likely multifactorial and certainly exacerbated by her atrial fibrillation with rapid ventricular response. Currently on amiodarone infusion at 3 mg per hour IV. She also has bilateral pleural effusions with likely some decompensated diastolic congestive heart failure for which she has been diuresing well. Her overall clinical trajectory is poor. I am worried that her respiratory status will continue to decline unless her atrial fibrillation is controlled successfully. I have spoken with the patient's attending hospitalist who plans on consulted palliative medicine for evaluation and further treatment. In the meantime I will attempt to  stabilize her heart rate more effectively and we will continue noninvasive positive pressure ventilation.  1. Acute on Chronic Hypercarbic Respiratory Failure:  Targeting saturation 88-94% to prevent V/Q mismatching and worsening of hypercarbia. Continuing treatment of underlying COPD as well as BiPAP support as needed for increased work of breathing. 2. Acute Hypoxic Respiratory Failure: Likely secondary to decompensated congestive heart failure. Continuing diuresis with Lasix IV daily. Recommend continuing as long as renal function and blood pressure allow. 3. Atrial Fibrillation with RVR:  Cardiology has signed off. Increasing patient's amiodarone back to 60 mg per hour IV. Continuing IV digoxin. Discontinuing oral Xarelto. Continuing telemetry monitoring. 4. Bilateral Pleural Effusions:  Likely decompensated diastolic congestive heart failure given prior echo. Recommend repeat imaging to ensure resolution of pleural effusions. Recommend consideration of left therapeutic and diagnostic thoracentesis with routine pleural fluid analysis in addition to cytology and culture. 5. Pulmonary Emphysema w/ Possible COPD:  Continuing to hold outpatient Dulera. Patient will need outpatient screening for alpha-1 antitrypsin deficiency with phenotype as well as full pulmonary function testing. Continuing Xopenex and Atrovent nebulizer therapies every 6 hours. Continuing Pulmicort nebulized twice daily. Continuing Solu-Medrol 30 mg IV daily. 6. Possible Pulmonary Hypertension: Suggested on transthoracic echocardiogram earlier this month. Recommend holding off on pulmonary arterial vasodilator therapy at this time. Holding off on right heart catheterization at this time. 7. Possible Pneumonia:  Status post empiric treatment with Vancomycin & Cefepime. 8. Bilateral Lung Nodules: Recommend repeat CT imaging without contrast after patient has undergone diuresis to evaluate size of nodules. No role for biopsy at this  time.  Remainder of care as per primary service. Plan of care discussed with attending hospitalist and bedside nurse.  Donna ChristenJennings E. Jamison NeighborNestor, M.D. [facility] Pager:  [phone] After 3pm or if no response, call [phone] 12/01/2016, 11:20 AM

## 2016-12-01 NOTE — Progress Notes (Signed)
PT Cancellation Note  Patient Details Name: Perlie GoldMary Difabio MRN: [id number] DOB: December 19, 1945   Cancelled Treatment:    Reason Eval/Treat Not Completed: Medical issues which prohibited therapy;Other (comment) (RN requesting to hold therapy for today). PT will continue to f/u with pt as appropriate.   Alessandra BevelsJennifer M Abhiraj Dozal 12/01/2016, 1:58 PM

## 2016-12-01 NOTE — Progress Notes (Signed)
PROGRESS NOTE    Perlie GoldMary Baskins  LKG:[id number] DOB: May 29, 1946 DOA: 11/22/2016 PCP: Frederica KusterMILLER, STEPHEN M, MD   Brief Narrative: 70 y.o.WF PMHx Atrial fibrillation on Xarelto, Pulmonary Hypertension COPD, PE,Raynaud's syndrome, HLD, GERD, presented with generalized weakness. Patient was seen at [facility] couple of weeks ago and diagnosed with atrial fibrillation and failure to thrive at that time she was admitted to ICU and had several episodes of atrial fibrillation. [facility] and was [place]term.Patient signed herself out [facility] from [facility] on 11/21/2016 as she was not happy with the care provided at [facility]. At home patient started feeling generalized weakness and was brought to hospital for further evaluation. In the ED patient was found to be in A. fib with RVR. On amiodarone. Cardiology and pulmonary critical care evaluated the patient. Patient is continues to decline. Discussed with the patient, her sister and son at length today. Patient is DO NOT RESUSCITATE. Plan to meet with palliative care service tomorrow.  Assessment & Plan:   Principal Problem:   Atrial fibrillation with rapid ventricular response (HCC) Active Problems:   COPD (chronic obstructive pulmonary disease) (HCC)   Hyperlipidemia   Acute on chronic respiratory failure (HCC)   Hypoxemia   Disorientation   Acute on chronic respiratory failure with hypercapnia (HCC)   COPD exacerbation (HCC)   Pleural effusion, right   Pulmonary hypertension   Acute saddle pulmonary embolism with acute cor pulmonale (HCC)   Hyponatremia   Major depressive disorder with single episode, in partial remission (HCC)   Personal history of noncompliance with medical treatment, presenting hazards to health   Respiratory acidosis   Pleural effusion   Acute respiratory failure with hypoxia (HCC)   Pulmonary emphysema (HCC)   Bilateral pleural  effusion  #Acute on chronic hypoxic and hypercapnic respiratory failure: Multifactorial etiology including possible pneumonia, underlying COPD, bilateral pleural effusion, atrial fibrillation with RVR. -Patient is a sterile complaining of shortness of breath requiring BiPAP. Oxygen saturation acceptable with BiPAP and with high flow oxygen. But he still feels dyspneic. CT scan with bilateral effusion and left lower lobe consolidation. I discussed with pulmonary critical care team, Dr. Jamison NeighborNestor in detail. Patient will likely not tolerate any invasive procedure. Patient is clinically declining. -Patient already completed a course of antibiotics for pneumonia.  #Acute COPD exacerbation: On a steroid, nebulization, I recommended antibiotics course. Continue to monitor. -Also has pulmonary hypertension.  #Altered mental status likely metabolic encephalopathy and possibly may have CO2 narcosis: Patient has no focal neurological deficit. Continue to monitor. Palliative care and possibly hospice care.  #Depression: Evaluated by behavioral health.   #Atrial fibrillation with rapid ventricular response: The heart rate is not improving with the amiodarone drip. Currently on amiodarone drip. Receiving IV Lasix as tolerated. Patient is not tolerating beta blocker BLOCKER because of hypotension. On low dosed his oxygen. Cardiologist evaluated the patient and recommended palliative hospice evaluation. Cardiology signed off. -On xarelto for systemic anticoagulation, unable to take because of BiPAP.  #Pulmonary embolism: Resume xarelto when able to tolerate.  #Anemia of chronic disease: Monitor CBC.  #Goals of care: I reviewed patient's medical chart in detail. I discussed with Dr. Jamison NeighborNestor from pulmonology. I reevaluated the patient this afternoon and discussed with patient's sister. Patient with gradual clinical decline. I discussed with the patient's son Tinnie GensJeffrey over the phone. I informed him about declining  clinical condition despite of medical management. Patient is already partial DO  NOT RESUSCITATE with no intubation. After discussion with the patient, her sister and son, they agreed for DO NOT RESUSCITATE. I believe at this time, patient will benefit from comfort care and possibly hospice. Palliative care is planning to meet with family tomorrow morning.  DVT prophylaxis: Systemic anticoagulation. Code Status: Changed to DO NOT RESUSCITATE today after discussion with the patient, her sister and son. Family Communication: Discussed with son and sister Disposition Plan: Likely discharge to skilled nursing facility with hospice in 1-2 days.    Consultants:   Cardiology  Pulmonologist and critical care  Palliative care  Procedures: Echocardiogram Antimicrobials: Treated with vancomycin and cefepime.  Subjective: Patient was seen and examined at bedside twice today. Patient was complaining shortness of breath, weakness and fatigue. Denied chest pain headache nausea or vomiting. In the afternoon she was more somnolent. The BiPAP was removed and put her on high flow oxygen, maintaining oxygen saturation well.   Objective: Vitals:   12/01/16 1229 12/01/16 1324 12/01/16 1502 12/01/16 1508  BP:   95/68 95/68  Pulse:   (!) 119 80  Resp:   17 20  Temp: 97.7 F (36.5 C)     TempSrc: Axillary     SpO2:  98% 100% 99%  Weight:      Height:        Intake/Output Summary (Last 24 hours) at 12/01/16 1559 Last data filed at 12/01/16 1200  Gross per 24 hour  Intake            333.9 ml  Output             4090 ml  Net          -3756.1 ml   Filed Weights   11/29/16 0600 11/30/16 0600 12/01/16 0419  Weight: 64.3 kg (141 lb 12.1 oz) 63.2 kg (139 lb 5.3 oz) 61.4 kg (135 lb 5.8 oz)    Examination:  General exam: Ill-looking female lying in bed on BiPAP  Respiratory system: bibasal decreased breath sound, coarse breath sounds bilateral Cardiovascular system: Irregular heart rate,  tachycardic, S1 and S2 normal.  No pedal edema. Gastrointestinal system: Abdomen soft, nontender nondistended. Bowel sound positive. Central nervous system: Alert and awake but somnolent. Skin: No rashes, lesions or ulcers Psychiatry: Patient was somnolent and therefore unable to assess.    Data Reviewed: I have personally reviewed following labs and imaging studies  CBC:  Recent Labs Lab 11/25/16 0454 11/25/16 1752 11/27/16 0045 11/28/16 0900 11/28/16 1630  WBC 13.8* 13.5* 10.5 10.1 10.6*  HGB 10.1* 9.6* 9.4* 8.4* 8.5*  HCT 31.3* 29.7* 28.5* 25.9* 25.9*  MCV 99.1 99.0 97.6 94.9 94.9  PLT 109* 122* 135* 152 151   Basic Metabolic Panel:  Recent Labs Lab 11/27/16 0045 11/28/16 0900 11/28/16 1630  11/29/16 0435  11/29/16 1025 11/29/16 1232 11/29/16 1432 11/30/16 0212 12/01/16 0434  NA  --  125* 127*  < > 132*  < > 135 136 133* 134* 134*  K  --  3.5 3.2*  --  3.7  --   --   --   --  3.9 3.4*  CL  --  78* 76*  --  83*  --   --   --   --  83* 75*  CO2  --  40* 42*  --  42*  --   --   --   --  47* 50*  GLUCOSE  --  206* 149*  --  165*  --   --   --   --  91 128*  BUN  --  8 11  --  14  --   --   --   --  15 13  CREATININE  --  0.32* 0.35*  --  0.38*  --   --   --   --  <0.30* 0.35*  CALCIUM  --  7.7* 8.2*  --  7.8*  --   --   --   --  8.5* 8.2*  MG 1.5*  --  2.0  --  1.8  --   --   --   --  2.1 1.9  < > = values in this interval not displayed. GFR: Estimated Creatinine Clearance: 60.4 mL/min (by C-G formula based on SCr of 0.35 mg/dL (L)). Liver Function Tests:  Recent Labs Lab 11/28/16 1630  AST 27  ALT 51  ALKPHOS 61  BILITOT 0.5  PROT 4.2*  ALBUMIN 2.6*   No results for input(s): LIPASE, AMYLASE in the last 168 hours.  Recent Labs Lab 11/27/16 0045 11/28/16 1618  AMMONIA 51* 27   Coagulation Profile: No results for input(s): INR, PROTIME in the last 168 hours. Cardiac Enzymes:  Recent Labs Lab 11/27/16 0045  TROPONINI <0.03   BNP (last 3  results) No results for input(s): PROBNP in the last 8760 hours. HbA1C: No results for input(s): HGBA1C in the last 72 hours. CBG: No results for input(s): GLUCAP in the last 168 hours. Lipid Profile: No results for input(s): CHOL, HDL, LDLCALC, TRIG, CHOLHDL, LDLDIRECT in the last 72 hours. Thyroid Function Tests: No results for input(s): TSH, T4TOTAL, FREET4, T3FREE, THYROIDAB in the last 72 hours. Anemia Panel: No results for input(s): VITAMINB12, FOLATE, FERRITIN, TIBC, IRON, RETICCTPCT in the last 72 hours. Sepsis Labs:  Recent Labs Lab 11/27/16 0045  LATICACIDVEN 0.8    Recent Results (from the past 240 hour(s))  MRSA PCR Screening     Status: None   Collection Time: 11/23/16  6:00 AM  Result Value Ref Range Status   MRSA by PCR NEGATIVE NEGATIVE Final    Comment:        The GeneXpert MRSA Assay (FDA approved for NASAL specimens only), is one component of a comprehensive MRSA colonization surveillance program. It is not intended to diagnose MRSA infection nor to guide or monitor treatment for MRSA infections.   Culture, blood (Routine X 2) w Reflex to ID Panel     Status: None   Collection Time: 11/23/16  7:38 AM  Result Value Ref Range Status   Specimen Description BLOOD RIGHT ANTECUBITAL  Final   Special Requests BOTTLES DRAWN AEROBIC AND ANAEROBIC 8CC  Final   Culture NO GROWTH 5 DAYS  Final   Report Status 11/28/2016 FINAL  Final  Culture, blood (Routine X 2) w Reflex to ID Panel     Status: None   Collection Time: 11/23/16  7:45 AM  Result Value Ref Range Status   Specimen Description BLOOD LEFT ANTECUBITAL  Final   Special Requests BOTTLES DRAWN AEROBIC AND ANAEROBIC 10CC  Final   Culture NO GROWTH 5 DAYS  Final   Report Status 11/28/2016 FINAL  Final  MRSA PCR Screening     Status: None   Collection Time: 11/27/16 12:27 PM  Result Value Ref Range Status   MRSA by PCR NEGATIVE NEGATIVE Final    Comment:        The GeneXpert MRSA Assay (FDA approved  for NASAL specimens only), is one component of a comprehensive MRSA colonization surveillance program. It  is not intended to diagnose MRSA infection nor to guide or monitor treatment for MRSA infections.   Respiratory Panel by PCR     Status: None   Collection Time: 11/29/16  3:30 AM  Result Value Ref Range Status   Adenovirus NOT DETECTED NOT DETECTED Final   Coronavirus 229E NOT DETECTED NOT DETECTED Final   Coronavirus HKU1 NOT DETECTED NOT DETECTED Final   Coronavirus NL63 NOT DETECTED NOT DETECTED Final   Coronavirus OC43 NOT DETECTED NOT DETECTED Final   Metapneumovirus NOT DETECTED NOT DETECTED Final   Rhinovirus / Enterovirus NOT DETECTED NOT DETECTED Final   Influenza A NOT DETECTED NOT DETECTED Final   Influenza B NOT DETECTED NOT DETECTED Final   Parainfluenza Virus 1 NOT DETECTED NOT DETECTED Final   Parainfluenza Virus 2 NOT DETECTED NOT DETECTED Final   Parainfluenza Virus 3 NOT DETECTED NOT DETECTED Final   Parainfluenza Virus 4 NOT DETECTED NOT DETECTED Final   Respiratory Syncytial Virus NOT DETECTED NOT DETECTED Final   Bordetella pertussis NOT DETECTED NOT DETECTED Final   Chlamydophila pneumoniae NOT DETECTED NOT DETECTED Final   Mycoplasma pneumoniae NOT DETECTED NOT DETECTED Final         Radiology Studies: Ct Chest Wo Contrast  Result Date: 11/30/2016 CLINICAL DATA:  Shortness of Breath EXAM: CT CHEST WITHOUT CONTRAST TECHNIQUE: Multidetector CT imaging of the chest was performed following the standard protocol without IV contrast. COMPARISON:  Chest x-ray from earlier in the same day FINDINGS: Cardiovascular: Somewhat limited by a lack of IV contrast. Aortic calcifications are seen without aneurysmal dilatation. Cardiac structures are within normal limits. No significant coronary calcifications are noted. Mediastinum/Nodes: Thoracic inlet is within normal limits. No hilar or mediastinal adenopathy is seen. Some calcified mediastinal lymph nodes are noted  consistent with prior granulomatous disease. Lungs/Pleura: Bilateral pleural effusions are seen left greater than right similar to that noted on recent plain film examination. Left lower lobe consolidation is seen. Additionally there is some patchy rounded density in both the anterior aspect of the right upper lobe on image number 77 of series 3 and in the anterior aspect of the left upper lobe on image number 70 of series 3. These changes were not present on the prior exam. Upper Abdomen: Mild ascites is noted but incompletely evaluated on this exam. No other upper abdominal abnormality is seen. Musculoskeletal: Chronic compression deformity of T5 is noted. IMPRESSION: Bilateral pleural effusions left greater than right with left lower lobe consolidation. Nodular areas of increased density which may simply represent infiltrate although the possibility of mass lesion could not be totally excluded on the basis of this exam. Short-term followup following appropriate therapy is recommended this would be best accomplished with additional noncontrast CT scan. Electronically Signed   By: Alcide Clever M.D.   On: 11/30/2016 14:52   Dg Chest Port 1 View  Result Date: 11/30/2016 CLINICAL DATA:  SOB, more oxygen needs, possible fluid overload because of stopped lasix. EXAM: PORTABLE CHEST - 1 VIEW COMPARISON:  11/29/2016 FINDINGS: Right arm PICC line stable. Mild central pulmonary vascular congestion stable. Heart size normal.  Atheromatous aorta. Bilateral pleural effusions left greater than right as before. No pneumothorax. Visualized bones unremarkable. IMPRESSION: 1. Little change in pulmonary vascular congestion and bilateral pleural effusions. Electronically Signed   By: Corlis Leak M.D.   On: 11/30/2016 08:35        Scheduled Meds: . budesonide (PULMICORT) nebulizer solution  0.5 mg Nebulization BID  . digoxin  0.125 mg  Intravenous Daily  . feeding supplement (ENSURE ENLIVE)  237 mL Oral BID BM  .  furosemide  60 mg Intravenous Daily  . ipratropium  0.5 mg Nebulization Q6H  . levalbuterol  0.63 mg Nebulization Q6H  . methylPREDNISolone (SOLU-MEDROL) injection  30 mg Intravenous Q24H  . multivitamin with minerals  1 tablet Oral Daily  . sodium chloride flush  10-40 mL Intracatheter Q12H   Continuous Infusions: . amiodarone 60 mg/hr (12/01/16 1152)  . amiodarone       LOS: 8 days    Layal Javid Jaynie Collins, MD [facility] Pager [phone]  If 7PM-7AM, please contact night-coverage [email or web address] Password [email or web address] 12/01/2016, 3:59 PM

## 2016-12-01 NOTE — Progress Notes (Signed)
Subjective:   69 y.o.female with known history of atrial fib, COPD, GERD, PE and failure to thrive, who has been seen at [facility] is elevated today .   May be back in atrial fib. C/o shortness of breath   Objective:  Temp:  [97 F (36.1 C)-98.6 F (37 C)] 98 F (36.7 C) (12/27 0822) Pulse Rate:  [105-126] 125 (12/27 0700) Resp:  [13-30] 22 (12/27 0700) BP: (97-161)/(53-77) 106/69 (12/27 0700) SpO2:  [90 %-100 %] 97 % (12/27 0744) Arterial Line BP: (112-139)/(54-65) 124/59 (12/26 2200) FiO2 (%):  [40 %] 40 % (12/27 0744) Weight:  [135 lb 5.8 oz (61.4 kg)] 135 lb 5.8 oz (61.4 kg) (12/27 0419) Weight change: -3 lb 15.5 oz (-1.8 kg)  Intake/Output from previous day: 12/26 0701 - 12/27 0700 In: 549.9 [P.O.:120; I.V.:379.9; IV Piggyback:50] Out: 3532 [Urine:4665]  Intake/Output from this shift: No intake/output data recorded.  Physical Exam:  General:   Elderly female, appears chronically ill HEENT:   No JVD Lungs:   Clear Cor:   RR, tachy Abd:   NT,  + BS  EXT:   No C/C/E Neuro:  Awake, alert, answers some questions   Lab Results: Results for orders placed or performed during the hospital encounter of 11/22/16 (from the past 48 hour(s))  Sodium     Status: None   Collection Time: 11/29/16 10:25 AM  Result Value Ref Range   Sodium 135 135 - 145 mmol/L  Sodium     Status: None   Collection Time: 11/29/16 12:32 PM  Result Value Ref Range   Sodium 136 135 - 145 mmol/L  Sodium     Status: Abnormal   Collection Time: 11/29/16  2:32 PM  Result Value Ref Range   Sodium 133 (L) 135 - 145 mmol/L  Basic metabolic panel     Status: Abnormal   Collection Time: 11/30/16  2:12 AM  Result Value Ref Range   Sodium 134 (L) 135 - 145 mmol/L   Potassium 3.9 3.5 - 5.1 mmol/L   Chloride 83 (L) 101 - 111 mmol/L   CO2 47 (H) 22 - 32 mmol/L   Glucose, Bld 91 65 - 99 mg/dL   BUN 15 6 - 20 mg/dL   Creatinine, Ser <0.30 (L) 0.44 - 1.00 mg/dL   Calcium 8.5 (L) 8.9  - 10.3 mg/dL   GFR calc non Af Amer NOT CALCULATED >60 mL/min   GFR calc Af Amer NOT CALCULATED >60 mL/min    Comment: (NOTE) The eGFR has been calculated using the CKD EPI equation. This calculation has not been validated in all clinical situations. eGFR's persistently <60 mL/min signify possible Chronic Kidney Disease.    Anion gap 4 (L) 5 - 15  Magnesium     Status: None   Collection Time: 11/30/16  2:12 AM  Result Value Ref Range   Magnesium 2.1 1.7 - 2.4 mg/dL  Blood gas, arterial     Status: Abnormal   Collection Time: 11/30/16  8:23 AM  Result Value Ref Range   O2 Content 2.0 L/min   Delivery systems NASAL CANNULA    pH, Arterial 7.310 (L) 7.350 - 7.450   pCO2 arterial 102 (HH) 32.0 - 48.0 mmHg    Comment: CRITICAL RESULT CALLED TO, READ BACK BY AND VERIFIED WITH: SUE PALMER,RRT,RCP AT 0840 BY M.SMITH,RRT,RCP ON 11/30/16    pO2, Arterial 64.9 (L) 83.0 - 108.0 mmHg   Bicarbonate 50.1 (H) 20.0 - 28.0 mmol/L  Acid-Base Excess 22.7 (H) 0.0 - 2.0 mmol/L   O2 Saturation 92.9 %   Patient temperature 97.8    Collection site A-LINE    Drawn by [id number]    Sample type ARTERIAL DRAW    Allens test (pass/fail) PASS PASS  Basic metabolic panel     Status: Abnormal   Collection Time: 12/01/16  4:34 AM  Result Value Ref Range   Sodium 134 (L) 135 - 145 mmol/L   Potassium 3.4 (L) 3.5 - 5.1 mmol/L   Chloride 75 (L) 101 - 111 mmol/L   CO2 50 (H) 22 - 32 mmol/L   Glucose, Bld 128 (H) 65 - 99 mg/dL   BUN 13 6 - 20 mg/dL   Creatinine, Ser 0.35 (L) 0.44 - 1.00 mg/dL   Calcium 8.2 (L) 8.9 - 10.3 mg/dL   GFR calc non Af Amer >60 >60 mL/min   GFR calc Af Amer >60 >60 mL/min    Comment: (NOTE) The eGFR has been calculated using the CKD EPI equation. This calculation has not been validated in all clinical situations. eGFR's persistently <60 mL/min signify possible Chronic Kidney Disease.    Anion gap 9 5 - 15  Magnesium     Status: None   Collection Time: 12/01/16  4:34 AM  Result  Value Ref Range   Magnesium 1.9 1.7 - 2.4 mg/dL    Imaging: Imaging results have been reviewed Left pleural effusion. Looks better. Less interstitial edema   Tele- NSR 70s  Assessment/Plan:   Principal Problem:   Atrial fibrillation with rapid ventricular response (HCC) Active Problems:   COPD (chronic obstructive pulmonary disease) (HCC)   Hyperlipidemia   Acute on chronic respiratory failure (HCC)   Hypoxemia   Disorientation   Acute on chronic respiratory failure with hypercapnia (HCC)   COPD exacerbation (HCC)   Pleural effusion, right   Pulmonary hypertension   Acute saddle pulmonary embolism with acute cor pulmonale (HCC)   Hyponatremia   Major depressive disorder with single episode, in partial remission ([place])   Personal history of noncompliance with medical treatment, presenting hazards to health   Respiratory acidosis   Pleural effusion   Acute respiratory failure with hypoxia ([place])   1. Atrial fib:   I have ordered ECG to further evaluate her rhythm. Continue amio for now She remains in atrial fib The patient's condition continue to generally decline Given her respiratory issues, the likelyhood of maintaining NSR is extremely low  Her prognosis is poor. Agree with palliative care / hospice consult   2. Dyspnea:  Multifactorial - COPD, pulmonary HTN, PE. Marland Kitchen ?pneumonia  Chronic issue ABG yesterday - 7.31/102/65     Will sign off.   Mertie Moores, MD  12/01/2016 8:22 AM    [address],  [place], [place]  [phone] Pager [phone] Phone: [phone]; Fax: [phone]

## 2016-12-01 NOTE — Progress Notes (Signed)
Pt. Refused CPT this AM.

## 2016-12-01 NOTE — Progress Notes (Signed)
Pt request to come off Bipap for a little while. 5LHFNC applied. O2 at 92% at this time. Will continue to monitor closely.

## 2016-12-01 NOTE — Progress Notes (Signed)
OT Cancellation Note  Patient Details Name: Shannon Jackson MRN: [id number] DOB: Oct 30, 1946   Cancelled Treatment:    Reason Eval/Treat Not Completed: Medical issues which prohibited therapy Nsg requesting to hold therapy today. Will follow up tomorrow. [facility]  Indiya Izquierdo, OT/L  [phone] 12/01/2016 12/01/2016, 2:50 PM

## 2016-12-02 DIAGNOSIS — J432 Centrilobular emphysema: Secondary | ICD-10-CM

## 2016-12-02 DIAGNOSIS — Z515 Encounter for palliative care: Secondary | ICD-10-CM

## 2016-12-02 MED ORDER — METHYLPREDNISOLONE SODIUM SUCC 40 MG IJ SOLR
10.0000 mg | INTRAMUSCULAR | Status: DC
  Administered 2016-12-02: 10 mg via INTRAVENOUS
  Filled 2016-12-02: qty 1

## 2016-12-02 MED ORDER — SODIUM CHLORIDE 0.9% FLUSH
3.0000 mL | Freq: Two times a day (BID) | INTRAVENOUS | Status: DC
Start: 2016-12-02 — End: 2016-12-06
  Administered 2016-12-02 (×2): 10 mL via INTRAVENOUS
  Administered 2016-12-03 – 2016-12-05 (×2): 3 mL via INTRAVENOUS

## 2016-12-02 MED ORDER — LORAZEPAM 2 MG/ML IJ SOLN
0.2500 mg | Freq: Two times a day (BID) | INTRAMUSCULAR | Status: DC
  Administered 2016-12-02: 0.25 mg via INTRAVENOUS
  Filled 2016-12-02: qty 1

## 2016-12-02 MED ORDER — MAGNESIUM SULFATE 2 GM/50ML IV SOLN
2.0000 g | Freq: Once | INTRAVENOUS | Status: AC
  Administered 2016-12-02: 2 g via INTRAVENOUS
  Filled 2016-12-02: qty 50

## 2016-12-02 MED ORDER — MORPHINE SULFATE (PF) 2 MG/ML IV SOLN
2.0000 mg | INTRAVENOUS | Status: DC | PRN
  Administered 2016-12-02 – 2016-12-03 (×4): 4 mg via INTRAVENOUS
  Filled 2016-12-02 (×4): qty 2

## 2016-12-02 MED ORDER — SODIUM CHLORIDE 0.9 % IV SOLN
30.0000 meq | Freq: Once | INTRAVENOUS | Status: AC
  Administered 2016-12-02: 30 meq via INTRAVENOUS
  Filled 2016-12-02: qty 15

## 2016-12-02 MED ORDER — POTASSIUM CHLORIDE CRYS ER 20 MEQ PO TBCR: 20.0000 meq | EXTENDED_RELEASE_TABLET | ORAL | Status: DC

## 2016-12-02 MED ORDER — SODIUM CHLORIDE 0.9% FLUSH: 3.0000 mL | INTRAVENOUS | Status: DC | PRN

## 2016-12-02 MED ORDER — MORPHINE SULFATE (PF) 2 MG/ML IV SOLN: 1.0000 mg | INTRAVENOUS | Status: DC | PRN

## 2016-12-02 MED ORDER — SODIUM CHLORIDE 0.9 % IV SOLN
250.0000 mL | INTRAVENOUS | Status: DC | PRN
  Administered 2016-12-05: 250 mL via INTRAVENOUS

## 2016-12-02 NOTE — Consult Note (Signed)
Consultation Note Date: 12/02/2016   Patient Name: Shannon Jackson  DOB: 01-21-1946  MRN: [id number]  Age / Sex: 70 y.o., female  PCP: Wardell Honour, MD Referring Physician: Rosita Fire, MD  Reason for Consultation: Establishing goals of care, Non pain symptom management, Psychosocial/spiritual support and Withdrawal of life-sustaining treatment  HPI/Patient Profile: 70 y.o. female  with past medical history of COPD, PE, Afib, and failure to thrive  who was admitted on 11/22/2016 with chest pain and SOB.  During this hospitalization she was initially admitted to [facility], but transferred to [facility] on 12/23.  She has been unable to wean from BiPap for any extended period of time since admission.  She had acute on chronic respiratory failure on admission from multiple etiologies including possible pneumonia, bilateral pleural effusions, with bilateral upper lobe densities of uncertain significance, advanced COPD, and in recent weeks she had developed afib with RVR.  She was treated with antibiotics and pulmonary support.  She was also seen by cardiology and placed on an amiodarone gtt.  Unfortunately her afib remained uncontrolled and her breathing has not significantly improved despite 11 days of hospital treatment.  Clinical Assessment and Goals of Care: I met at bedside with Shannon Jackson and her son Shannon Jackson.  Shannon Jackson was sleeping and unable to communicate with me as she was on BiPap.  Shannon Jackson is Shannon Jackson's only son.  They have always been very close.  He lost is father (her 1st husband) this summer to pancreatic cancer.  Shannon Jackson 2nd husband died 6 yrs ago.  She has lived alone since.  She had a career in the [employer] and a strong work Psychologist, forensic.  She has suffered with COPD for approximately 10 years - but insisted on maintaining her independence.  She still drove and lived alone.  She has 9 brothers  and sisters - most of whom live in [place], [place].  Shannon Jackson is a very religious woman with great faith in the [place].  She is Shannon Jackson and I discussed his mother's health.  He understands that she will likely not be able to survive without BiPAP.  Her comfort is his priority.  She was a person who worried "about everything" and had a great fear of drowning.  Shannon Jackson does not want her to be anxious or feel like she is drowning.   We discussed inviting the family in to visit and then weaning her from BiPap peacefully in the next couple of days.  We re-discussed code status.  Shannon Jackson supports DNR.  We discussed changing her goal to "comfort" - Shannon Jackson is supportive of this as well.  Primary Decision Maker:  NEXT OF KIN Son, Shannon Jackson    SUMMARY OF RECOMMENDATIONS    Goal is comfort.  Terminal wean anticipated in the next 24-48 hours after family visits. Continue Lasix and Amiodarone gtt for today - until family can visit.  Will likely discontinue those tomorrow or Sat. When gtt is d/c'd we can consider moving the patient to  6N Wean IV steroids.  Decreased to 10 mg IV today.  Will D/C on Friday. Continue BiPap for comfort.  Code Status/Advance Care Planning:  DNR    Symptom Management:   Increase frequency of morphine to q 15 min PRN.  Increase dose to 2-4 mg  Add scheduled low dose ativan to PRN ativan already ordered.  Continue Lasix  Taper steroids  Continue amiodarone gtt today  Continue BiPap for comfort   D/C medications not geared towards comfort - vitamins, statins, etc  Liberalize diet  Palliative Prophylaxis:   Delirium Protocol, Frequent Pain Assessment and Oral Care  Additional Recommendations (Limitations, Scope, Preferences):  Full Comfort Care  Psycho-social/Spiritual:   Desire for further Chaplaincy support:yes, [place] order placed.  Additional Recommendations: Caregiving  Support/Resources  Prognosis:   Hours - Days  Discharge  Planning: Anticipated Hospital Death      Primary Diagnoses: Present on Admission: . Atrial fibrillation with rapid ventricular response ([place]) . COPD (chronic obstructive pulmonary disease) ([place]) . Hyperlipidemia   I have reviewed the medical record, interviewed the patient and family, and examined the patient. The following aspects are pertinent.  Past Medical History:  Diagnosis Date  . A-fib ([place])   . Allergic rhinitis   . Arthritis    left hip  . Cataract   . COPD (chronic obstructive pulmonary disease) ([place])   . Failure to thrive in adult   . GERD (gastroesophageal reflux disease)   . Hyperlipidemia   . Osteopenia   . Osteoporosis 08/2016  . Pulmonary embolism ([place])   . Raynaud's syndrome    Social History   Social History  . Marital status: Widowed    Spouse name: N/A  . Number of children: N/A  . Years of education: N/A   Occupational History  . retired    Social History Main Topics  . Smoking status: Former Smoker    Packs/day: 1.00    Years: 25.00    Types: Cigarettes    Quit date: 12/06/2006  . Smokeless tobacco: Never Used  . Alcohol use No  . Drug use: No  . Sexual activity: Not Currently    Birth control/ protection: Post-menopausal   Other Topics Concern  . None   Social History Narrative  . None   Family History  Problem Relation Age of Onset  . Melanoma Mother   . Emphysema Father   . Arthritis Father   . Cancer Brother     skin, melanoma   Scheduled Meds: . budesonide (PULMICORT) nebulizer solution  0.5 mg Nebulization BID  . digoxin  0.125 mg Intravenous Daily  . feeding supplement (ENSURE ENLIVE)  237 mL Oral BID BM  . furosemide  60 mg Intravenous Daily  . ipratropium  0.5 mg Nebulization Q6H  . levalbuterol  0.63 mg Nebulization Q6H  . LORazepam  0.25 mg Intravenous BID  . methylPREDNISolone (SOLU-MEDROL) injection  10 mg Intravenous Q24H  . potassium chloride (KCL MULTIRUN) 30 mEq in 265 mL IVPB  30 mEq Intravenous Once    . sodium chloride flush  10-40 mL Intracatheter Q12H  . sodium chloride flush  3 mL Intravenous Q12H   Continuous Infusions: . amiodarone 30 mg/hr (12/02/16 0533)   PRN Meds:.[CANCELED] Place/Maintain arterial line **AND** sodium chloride, sodium chloride, levalbuterol, LORazepam, morphine injection, ondansetron **OR** ondansetron (ZOFRAN) IV, sodium chloride flush, sodium chloride flush Allergies  Allergen Reactions  . Codeine Nausea And Vomiting  . Levofloxacin     IV form caused swelling in hand,  itching and burning.  . Lovenox [Enoxaparin Sodium]     Per EMS   Review of Systems on Bipap - unable.  Physical Exam  Thin well developed female who appears younger than stated age Head:  At, [place] CV reg rate, irreg rhythm, no m/r/g Resp on bipap, no distress Abdomen soft , nt, nd Extremities - no deformities, no edema Psych - unable to assess - sleeping on bipap after receiving morphine. Skin - no frank bruises, rash, lesions    Vital Signs: BP 123/61   Pulse 69   Temp 97.8 F (36.6 C) (Oral)   Resp 12   Ht '5\' 6"'$  (1.676 m)   Wt 60.8 kg (134 lb 0.6 oz)   SpO2 98%   BMI 21.63 kg/m  Pain Assessment: CPOT POSS *See Group Information*: 1-Acceptable,Awake and alert Pain Score: Asleep   SpO2: SpO2: 98 % O2 Device:SpO2: 98 % O2 Flow Rate: .O2 Flow Rate (L/min): 5 L/min  IO: Intake/output summary:  Intake/Output Summary (Last 24 hours) at 12/02/16 1049 Last data filed at 12/02/16 0911  Gross per 24 hour  Intake           581.06 ml  Output             2400 ml  Net         -1818.94 ml    LBM: Last BM Date: 11/27/16 Baseline Weight: Weight: 50.3 kg (111 lb) Most recent weight: Weight: 60.8 kg (134 lb 0.6 oz)     Palliative Assessment/Data:   Flowsheet Rows   Flowsheet Row Most Recent Value  Intake Tab  Referral Department  Hospitalist  Unit at Time of Referral  Intermediate Care Unit  Palliative Care Primary Diagnosis  Pulmonary  Date Notified  12/01/16   Palliative Care Type  New Palliative care  Reason for referral  Clarify Goals of Care, End of Life Care Assistance  Date of Admission  11/22/16  Date first seen by Palliative Care  12/02/16  # of days Palliative referral response time  1 Day(s)  # of days IP prior to Palliative referral  9  Clinical Assessment  Palliative Performance Scale Score  20%  Dyspnea Max Last 24 Hours  6  Dyspnea Min Last 24 hours  4  Anxiety Max Last 24 Hours  6  Anxiety Min Last 24 Hours  4  Psychosocial & Spiritual Assessment  Palliative Care Outcomes  Patient/Family meeting held?  Yes  Palliative Care Outcomes  Improved non-pain symptom therapy, Clarified goals of care, Changed to focus on comfort      Time In: 9:15 Time Out: 10:45 Time Total: 90 min Greater than 50%  of this time was spent counseling and coordinating care related to the above assessment and plan. Spoke with bedside RN and charge RN  Signed by: Imogene Burn, PA-C Palliative Medicine Pager: [phone]  Please contact Palliative Medicine Team phone at [phone] for questions and concerns.  For individual provider: See Shea Evans

## 2016-12-02 NOTE — Progress Notes (Addendum)
PROGRESS NOTE    Shannon Jackson  VWU:[id number] DOB: 1946/01/26 DOA: 11/22/2016 PCP: Frederica Kuster, MD   Brief Narrative: 70 y.o.WF PMHx Atrial fibrillation on Xarelto, Pulmonary Hypertension COPD, PE,Raynaud's syndrome, HLD, GERD, presented with generalized weakness. Patient was seen at [facility] couple of weeks ago and diagnosed with atrial fibrillation and failure to thrive at that time she was admitted to ICU and had several episodes of atrial fibrillation. [facility] and was supposed to go to [place] for long-term.Patient signed herself out AMA from [facility] on 11/21/2016 as she was not happy with the care provided at [facility]. At home patient started feeling generalized weakness and was brought to hospital for further evaluation. In the ED patient was found to be in A. fib with RVR. On amiodarone. Cardiology and pulmonary critical care evaluated the patient. Patient is continues to decline. Discussed with the patient, her sister and son at length today. Patient is DO NOT RESUSCITATE. Plan to meet with palliative care service tomorrow.  Assessment & Plan:   Principal Problem:   Atrial fibrillation with rapid ventricular response (HCC) Active Problems:   COPD (chronic obstructive pulmonary disease) (HCC)   Hyperlipidemia   Acute on chronic respiratory failure (HCC)   Hypoxemia   Disorientation   Acute on chronic respiratory failure with hypercapnia (HCC)   COPD exacerbation (HCC)   Pleural effusion, right   Pulmonary hypertension   Acute saddle pulmonary embolism with acute cor pulmonale (HCC)   Hyponatremia   Major depressive disorder with single episode, in partial remission (HCC)   Personal history of noncompliance with medical treatment, presenting hazards to health   Respiratory acidosis   Pleural effusion   Acute respiratory failure with hypoxia (HCC)   Pulmonary emphysema (HCC)   Bilateral pleural  effusion   Hypotension   Goals of care, counseling/discussion   Palliative care encounter   Comfort measures only status  #Acute on chronic hypoxic and hypercapnic respiratory failure: Multifactorial etiology including possible pneumonia, underlying COPD, bilateral pleural effusion, atrial fibrillation with RVR. -No clinical improvement. Patient is still complaining of shortness of breath and dyspnea requiring BiPAP. Oxygen saturation however acceptable. I discussed with the pulmonary team yesterday. I consulted palliative care service. Palliative care consult appreciated. Planning to focus on more comfort care after family visits. For now continue current drip and treatment. -Continue BiPAP for comfort measures. -Patient already completed a course of antibiotics for pneumonia.  #Acute COPD exacerbation: On a steroid, nebulization. Tapering the dose of steroid.  #Altered mental status likely metabolic encephalopathy and possibly may have CO2 narcosis: Patient has no focal neurological deficit. Continue to monitor. Palliative care and possibly hospice care.  #Depression: Evaluated by behavioral health.   #Atrial fibrillation with rapid ventricular response: The heart rate is not improving with the amiodarone drip. Currently on amiodarone drip. IV Lasix as tolerated. Patient is not able to tolerate a beta blocker or calcium channel blocker because of hypotension. Already evaluated by cardiologist recommended palliative hospice evaluation. Cardiologist signed off.  -On xarelto for systemic anticoagulation, unable to take because of BiPAP.   #Pulmonary embolism: Resume xarelto when able to tolerate. Or may not require because patient likely go to hospice.  #Anemia of chronic disease: Monitor CBC.  #Goals of care: I had a long discussion with the patient's sister and son over the phone yesterday. Meeting with the palliative care this morning. Patient is DO NOT RESUSCITATE and we'll focus more on  comfort  measures. Patient will likely go to hospice in 1-2 days. We will continue to follow up with palliative care service and with the family's. DVT prophylaxis: Systemic anticoagulation. Code Status: DO NOT RESUSCITATE Family Communication: Did not meet family at bedside today. Disposition Plan: Likely discharge to skilled nursing facility with hospice in 1-2 days.    Consultants:   Cardiology  Pulmonologist and critical care  Palliative care  Procedures: Echocardiogram Antimicrobials: Treated with vancomycin and cefepime.  Subjective: Patient was seen and examined at bedside twice today. Patient is somnolent however able to answer a few questions. Requiring BiPAP for dyspnea. Complaining of shortness of breath and weakness. No headache, chest pain, nausea or vomiting.   Objective: Vitals:   12/02/16 0738 12/02/16 0801 12/02/16 1130 12/02/16 1144  BP:   129/61   Pulse:   (!) 123   Resp:   (!) 25   Temp:  97.8 F (36.6 C)  97.7 F (36.5 C)  TempSrc:  Axillary  Axillary  SpO2: 98%  94%   Weight:      Height:        Intake/Output Summary (Last 24 hours) at 12/02/16 1151 Last data filed at 12/02/16 0911  Gross per 24 hour  Intake           581.06 ml  Output             2400 ml  Net         -1818.94 ml   Filed Weights   11/30/16 0600 12/01/16 0419 12/02/16 0341  Weight: 63.2 kg (139 lb 5.3 oz) 61.4 kg (135 lb 5.8 oz) 60.8 kg (134 lb 0.6 oz)    Examination:  General exam: Ill-looking female lying on bed the mild somnolent, on BiPAP. Respiratory system: Bibasal decreased breath sound with a course bilateral Cardiovascular system: Irregular heart rate, tachycardic, S1 and S2 normal.. Gastrointestinal system: Abdomen soft, nontender nondistended. Bowel sound positive. Central nervous system: Somnolent but alert awake and following simple commands. Skin: No rashes, lesions or ulcers Psychiatry: Unable to assess fully because of somnolence.  Data Reviewed: I have  personally reviewed following labs and imaging studies  CBC:  Recent Labs Lab 11/25/16 1752 11/27/16 0045 11/28/16 0900 11/28/16 1630  WBC 13.5* 10.5 10.1 10.6*  HGB 9.6* 9.4* 8.4* 8.5*  HCT 29.7* 28.5* 25.9* 25.9*  MCV 99.0 97.6 94.9 94.9  PLT 122* 135* 152 151   Basic Metabolic Panel:  Recent Labs Lab 11/27/16 0045 11/28/16 0900 11/28/16 1630  11/29/16 0435  11/29/16 1025 11/29/16 1232 11/29/16 1432 11/30/16 0212 12/01/16 0434  NA  --  125* 127*  < > 132*  < > 135 136 133* 134* 134*  K  --  3.5 3.2*  --  3.7  --   --   --   --  3.9 3.4*  CL  --  78* 76*  --  83*  --   --   --   --  83* 75*  CO2  --  40* 42*  --  42*  --   --   --   --  47* 50*  GLUCOSE  --  206* 149*  --  165*  --   --   --   --  91 128*  BUN  --  8 11  --  14  --   --   --   --  15 13  CREATININE  --  0.32* 0.35*  --  0.38*  --   --   --   --  <  0.30* 0.35*  CALCIUM  --  7.7* 8.2*  --  7.8*  --   --   --   --  8.5* 8.2*  MG 1.5*  --  2.0  --  1.8  --   --   --   --  2.1 1.9  < > = values in this interval not displayed. GFR: Estimated Creatinine Clearance: 60.4 mL/min (by C-G formula based on SCr of 0.35 mg/dL (L)). Liver Function Tests:  Recent Labs Lab 11/28/16 1630  AST 27  ALT 51  ALKPHOS 61  BILITOT 0.5  PROT 4.2*  ALBUMIN 2.6*   No results for input(s): LIPASE, AMYLASE in the last 168 hours.  Recent Labs Lab 11/27/16 0045 11/28/16 1618  AMMONIA 51* 27   Coagulation Profile: No results for input(s): INR, PROTIME in the last 168 hours. Cardiac Enzymes:  Recent Labs Lab 11/27/16 0045  TROPONINI <0.03   BNP (last 3 results) No results for input(s): PROBNP in the last 8760 hours. HbA1C: No results for input(s): HGBA1C in the last 72 hours. CBG:  Recent Labs Lab 12/01/16 1648  GLUCAP 118*   Lipid Profile: No results for input(s): CHOL, HDL, LDLCALC, TRIG, CHOLHDL, LDLDIRECT in the last 72 hours. Thyroid Function Tests: No results for input(s): TSH, T4TOTAL, FREET4,  T3FREE, THYROIDAB in the last 72 hours. Anemia Panel: No results for input(s): VITAMINB12, FOLATE, FERRITIN, TIBC, IRON, RETICCTPCT in the last 72 hours. Sepsis Labs:  Recent Labs Lab 11/27/16 0045  LATICACIDVEN 0.8    Recent Results (from the past 240 hour(s))  MRSA PCR Screening     Status: None   Collection Time: 11/23/16  6:00 AM  Result Value Ref Range Status   MRSA by PCR NEGATIVE NEGATIVE Final    Comment:        The GeneXpert MRSA Assay (FDA approved for NASAL specimens only), is one component of a comprehensive MRSA colonization surveillance program. It is not intended to diagnose MRSA infection nor to guide or monitor treatment for MRSA infections.   Culture, blood (Routine X 2) w Reflex to ID Panel     Status: None   Collection Time: 11/23/16  7:38 AM  Result Value Ref Range Status   Specimen Description BLOOD RIGHT ANTECUBITAL  Final   Special Requests BOTTLES DRAWN AEROBIC AND ANAEROBIC 8CC  Final   Culture NO GROWTH 5 DAYS  Final   Report Status 11/28/2016 FINAL  Final  Culture, blood (Routine X 2) w Reflex to ID Panel     Status: None   Collection Time: 11/23/16  7:45 AM  Result Value Ref Range Status   Specimen Description BLOOD LEFT ANTECUBITAL  Final   Special Requests BOTTLES DRAWN AEROBIC AND ANAEROBIC 10CC  Final   Culture NO GROWTH 5 DAYS  Final   Report Status 11/28/2016 FINAL  Final  MRSA PCR Screening     Status: None   Collection Time: 11/27/16 12:27 PM  Result Value Ref Range Status   MRSA by PCR NEGATIVE NEGATIVE Final    Comment:        The GeneXpert MRSA Assay (FDA approved for NASAL specimens only), is one component of a comprehensive MRSA colonization surveillance program. It is not intended to diagnose MRSA infection nor to guide or monitor treatment for MRSA infections.   Respiratory Panel by PCR     Status: None   Collection Time: 11/29/16  3:30 AM  Result Value Ref Range Status   Adenovirus NOT DETECTED NOT DETECTED Final  Coronavirus 229E NOT DETECTED NOT DETECTED Final   Coronavirus HKU1 NOT DETECTED NOT DETECTED Final   Coronavirus NL63 NOT DETECTED NOT DETECTED Final   Coronavirus OC43 NOT DETECTED NOT DETECTED Final   Metapneumovirus NOT DETECTED NOT DETECTED Final   Rhinovirus / Enterovirus NOT DETECTED NOT DETECTED Final   Influenza A NOT DETECTED NOT DETECTED Final   Influenza B NOT DETECTED NOT DETECTED Final   Parainfluenza Virus 1 NOT DETECTED NOT DETECTED Final   Parainfluenza Virus 2 NOT DETECTED NOT DETECTED Final   Parainfluenza Virus 3 NOT DETECTED NOT DETECTED Final   Parainfluenza Virus 4 NOT DETECTED NOT DETECTED Final   Respiratory Syncytial Virus NOT DETECTED NOT DETECTED Final   Bordetella pertussis NOT DETECTED NOT DETECTED Final   Chlamydophila pneumoniae NOT DETECTED NOT DETECTED Final   Mycoplasma pneumoniae NOT DETECTED NOT DETECTED Final         Radiology Studies: Ct Chest Wo Contrast  Result Date: 11/30/2016 CLINICAL DATA:  Shortness of Breath EXAM: CT CHEST WITHOUT CONTRAST TECHNIQUE: Multidetector CT imaging of the chest was performed following the standard protocol without IV contrast. COMPARISON:  Chest x-ray from earlier in the same day FINDINGS: Cardiovascular: Somewhat limited by a lack of IV contrast. Aortic calcifications are seen without aneurysmal dilatation. Cardiac structures are within normal limits. No significant coronary calcifications are noted. Mediastinum/Nodes: Thoracic inlet is within normal limits. No hilar or mediastinal adenopathy is seen. Some calcified mediastinal lymph nodes are noted consistent with prior granulomatous disease. Lungs/Pleura: Bilateral pleural effusions are seen left greater than right similar to that noted on recent plain film examination. Left lower lobe consolidation is seen. Additionally there is some patchy rounded density in both the anterior aspect of the right upper lobe on image number 77 of series 3 and in the anterior  aspect of the left upper lobe on image number 70 of series 3. These changes were not present on the prior exam. Upper Abdomen: Mild ascites is noted but incompletely evaluated on this exam. No other upper abdominal abnormality is seen. Musculoskeletal: Chronic compression deformity of T5 is noted. IMPRESSION: Bilateral pleural effusions left greater than right with left lower lobe consolidation. Nodular areas of increased density which may simply represent infiltrate although the possibility of mass lesion could not be totally excluded on the basis of this exam. Short-term followup following appropriate therapy is recommended this would be best accomplished with additional noncontrast CT scan. Electronically Signed   By: Alcide CleverMark  Lukens M.D.   On: 11/30/2016 14:52        Scheduled Meds: . budesonide (PULMICORT) nebulizer solution  0.5 mg Nebulization BID  . digoxin  0.125 mg Intravenous Daily  . feeding supplement (ENSURE ENLIVE)  237 mL Oral BID BM  . furosemide  60 mg Intravenous Daily  . ipratropium  0.5 mg Nebulization Q6H  . levalbuterol  0.63 mg Nebulization Q6H  . LORazepam  0.25 mg Intravenous BID  . methylPREDNISolone (SOLU-MEDROL) injection  10 mg Intravenous Q24H  . potassium chloride (KCL MULTIRUN) 30 mEq in 265 mL IVPB  30 mEq Intravenous Once  . sodium chloride flush  10-40 mL Intracatheter Q12H  . sodium chloride flush  3 mL Intravenous Q12H   Continuous Infusions: . amiodarone 30 mg/hr (12/02/16 0533)     LOS: 9 days    Seann Genther Jaynie CollinsPrasad Jj Enyeart, MD [facility] Pager [phone]  If 7PM-7AM, please contact night-coverage [email or web address] Password [email or web address] 12/02/2016, 11:51 AM

## 2016-12-02 NOTE — Progress Notes (Signed)
Patient requested to stay off of the Bipap and remain on the HFNC (going at 7L/min).  Patient's O2 saturation remains 90 and above with good waveform.

## 2016-12-02 NOTE — Progress Notes (Signed)
   12/02/16 1115  Clinical Encounter Type  Visited With Patient and family together  Visit Type Initial;Spiritual support  Referral From Patient;Family  Consult/Referral To Chaplain  Recommendations (follow up )  Spiritual Encounters  Spiritual Needs Prayer;Emotional  Stress Factors  Patient Stress Factors Major life changes  Family Stress Factors Major life changes  Chaplain prayed with patient family, ministry of presence,,  Spiritual and emotional support. Chaplain will follow up as needed with patient and family.  Advised to call spiritual care if further assistance is needed.  Chaplain Raylene Carmickle A. Kennedy BuckerLunsford,  MA-PC , BA-REL/PHIL  [phone]

## 2016-12-02 NOTE — Progress Notes (Signed)
PULMONARY / CRITICAL CARE MEDICINE   Name: Shannon Jackson MRN: [id number] DOB: 1946-08-05    ADMISSION DATE:  11/22/2016 CONSULTATION DATE:  11/27/16  REFERRING MD:  Dr. Cena BentonVega   CHIEF COMPLAINT:  SOB, generalized weakness  HISTORY OF PRESENT ILLNESS:   70 yo female presented to [facility] with progressive weakness after recent admit for A fib with RVR and FTT.  She has progressive respiratory failure in setting of advanced COPD with emphysema.  [facility].  PMHx of GERD, A fib on xarelto, PE.  SUBJECTIVE:  Remains on continuous BIPAP support. Family meeting planned at 10:00 to discuss goals of care.   VITAL SIGNS: BP 123/61   Pulse 69   Temp 97.8 F (36.6 C) (Oral)   Resp 12   Ht 5\' 6"  (1.676 m)   Wt 60.8 kg (134 lb 0.6 oz)   SpO2 98%   BMI 21.63 kg/m   INTAKE / OUTPUT: I/O last 3 completed shifts: In: 848.2 [P.O.:50; I.V.:748.2; IV Piggyback:50] Out: 4940 [Urine:4940]  PHYSICAL EXAMINATION: General:  Elderly female, no distress, lying in bed  Neuro:  Lethargic, follows commands, moves all extremities  HEENT:  BiPAP mask in place  Cardiovascular:  RRR, no MRG, NI S1/S2 Pulmonary: Decreased breath sounds bilateral lung bases, unlabored  Abdomen:  Soft. Nondistended. Nontender. Integument: Warm, dry, intact   LABS:  BMET  Recent Labs Lab 11/29/16 0435  11/29/16 1432 11/30/16 0212 12/01/16 0434  NA 132*  < > 133* 134* 134*  K 3.7  --   --  3.9 3.4*  CL 83*  --   --  83* 75*  CO2 42*  --   --  47* 50*  BUN 14  --   --  15 13  CREATININE 0.38*  --   --  <0.30* 0.35*  GLUCOSE 165*  --   --  91 128*  < > = values in this interval not displayed.  Electrolytes  Recent Labs Lab 11/29/16 0435 11/30/16 0212 12/01/16 0434  CALCIUM 7.8* 8.5* 8.2*  MG 1.8 2.1 1.9    CBC  Recent Labs Lab 11/27/16 0045 11/28/16 0900 11/28/16 1630  WBC 10.5 10.1 10.6*  HGB 9.4* 8.4* 8.5*  HCT 28.5* 25.9* 25.9*  PLT 135* 152 151     Coag's No results for input(s): APTT, INR in the last 168 hours.  Sepsis Markers  Recent Labs Lab 11/27/16 0045  LATICACIDVEN 0.8    ABG  Recent Labs Lab 11/28/16 1630 11/28/16 2338 11/30/16 0823  PHART 7.311* 7.401 7.310*  PCO2ART 96.8* 76.4* 102*  PO2ART 169* 106 64.9*    Liver Enzymes  Recent Labs Lab 11/28/16 1630  AST 27  ALT 51  ALKPHOS 61  BILITOT 0.5  ALBUMIN 2.6*    Cardiac Enzymes  Recent Labs Lab 11/27/16 0045  TROPONINI <0.03    Glucose  Recent Labs Lab 12/01/16 1648  GLUCAP 118*    Imaging No results found.  STUDIES:  TTE 11/24/16: Mild LVH with EF 60%. No regional wall motion abnormalities. Normal diastolic function. LA normal in size & RA mildly dilated. RV mildly dilated with preserved systolic function. Pulmonary artery systolic pressure 60 mmHg. No aortic stenosis or regurgitation. Aortic root normal in size. No mitral stenosis or regurgitation. No pulmonic regurgitation. Moderate tricuspid regurgitation. No pericardial effusion. CT CHEST W/O 11/30/16:  Bilateral pleural effusions left greater than right with some suggestion of layering on the left. Adjacent left lower lobe consolidation noted. Patchy nodularity  bilaterally. No pathologic mediastinal adenopathy. Some mild calcification and mediastinal lymph nodes. No pericardial effusion.  MICROBIOLOGY: MRSA PCR 12/19:  Negative Blood Ctx x2 12/19:  Negative MRSA PCR 12/23:  Negative  Respiratory Panel PCR 12/25:  Negative   ANTIBIOTICS: Cefepime 12/19 - 12/26 Vancomycin 12/19 - 12/25  SIGNIFICANT EVENTS: 12/18 - Admit to [facility] for weakness, found to be in AF with RVR, ? PNA.  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  - Maintain saturation  88-94% - BiPAP support as needed for increased work of breathing - Continue to hold outpatient Dulera - Continue Xopenex and Atrovent nebulizer therapies every 6 hours - Continue Pulmicort nebulized twice daily - Continuing Solu-Medrol 30 mg IV daily - Will need outpatient screening for alpha-1 antitrypsin deficiency with phenotype as well as full pulmonary function testing  Acute Hypoxic Respiratory Failure in secondary to bilateral pleural effusions in setting of congestive heart failure vs CAP (Has completed course of Vanc and Cefepime) - Continue diuresis with Lasix IV daily as BP and renal function tolerate  - Recommend consideration of left therapeutic and diagnostic thoracentesis with routine pleural fluid analysis in addition to cytology and culture  Atrial Fibrillation with RVR:  - Continue cardiac monitoring  - Cardiology has signed off - Continue amiodarone 60 mg per hour IV - Continue IV digoxin  Pulmonary Hypertension:  - Suggested on transthoracic echocardiogram earlier this month. Recommend holding off on pulmonary arterial vasodilator therapy at this time. Holding off on right heart catheterization at this time.  Bilateral Lung Nodules:  - Recommend repeat CT imaging without contrast after patient has undergone diuresis to evaluate size of nodules. No role for biopsy at this time.   Son updated at bedside. Planned Palliative care meeting at 10:00. Remainder of care as per primary service.  Jovita KussmaulKatalina Eubanks, AG-ACNP [place] [facility]  Pgr: [phone]  PCCM Pgr: [phone]  She isn't sure Bipap is helping.  Feels weak.  Ill appearing, thin, wearing Bipap.  Decreased BS.  No wheeze.  Abd soft.  HR tachycardic.  Multiple areas of bruising.  CO2 50, Creatinine 0.35.  Assessment/plan:  Acute on chronic hypoxic/hypercapnic respiratory failure in setting of advanced COPD/emphysema with acute on chronic diastolic CHF from A fib with RVR and WHO group  2 and 3 [facility]. - she is on maximal pulmonary regimen at present - not much addition to offer - agree with plan for further discussion with palliative care about goals of care >> currently DNR, but transition to hospice care likely best option   Updated pt's son at bedside.  Coralyn HellingVineet Aalina Brege, MD [facility] 12/02/2016, 10:56 AM Pager:  [phone] After 3pm call: [phone]

## 2016-12-02 NOTE — Progress Notes (Signed)
PT Cancellation Note  Patient Details Name: Shannon Jackson MRN: [id number] DOB: Feb 24, 1946   Cancelled Treatment:    Reason Eval/Treat Not Completed: PT screened, no needs identified, will sign off. Palliative consult today and pt is now comfort care only. PT will sign off.   [name] 12/02/2016, 12:49 PM

## 2016-12-03 MED ORDER — IPRATROPIUM-ALBUTEROL 0.5-2.5 (3) MG/3ML IN SOLN: 3.0000 mL | RESPIRATORY_TRACT | Status: DC | PRN

## 2016-12-03 MED ORDER — MORPHINE BOLUS VIA INFUSION
2.0000 mg | INTRAVENOUS | Status: DC | PRN
  Administered 2016-12-05 – 2016-12-06 (×2): 2 mg via INTRAVENOUS
  Filled 2016-12-03: qty 4

## 2016-12-03 MED ORDER — LORAZEPAM 2 MG/ML IJ SOLN
0.5000 mg | Freq: Two times a day (BID) | INTRAMUSCULAR | Status: DC
  Administered 2016-12-03 – 2016-12-05 (×4): 0.5 mg via INTRAVENOUS
  Filled 2016-12-03 (×4): qty 1

## 2016-12-03 MED ORDER — IPRATROPIUM-ALBUTEROL 0.5-2.5 (3) MG/3ML IN SOLN: 3.0000 mL | Freq: Three times a day (TID) | RESPIRATORY_TRACT | Status: DC

## 2016-12-03 MED ORDER — SODIUM CHLORIDE 0.9 % IV SOLN
1.0000 mg/h | INTRAVENOUS | Status: DC
  Administered 2016-12-03: 1 mg/h via INTRAVENOUS
  Filled 2016-12-03: qty 10

## 2016-12-03 MED ORDER — AMIODARONE HCL 200 MG PO TABS
200.0000 mg | ORAL_TABLET | Freq: Every day | ORAL | Status: DC
  Administered 2016-12-03 – 2016-12-05 (×3): 200 mg via ORAL
  Filled 2016-12-03 (×3): qty 1

## 2016-12-03 NOTE — Progress Notes (Signed)
Daily Progress Note   Patient Name: Shannon Jackson       Date: 12/03/2016 DOB: Oct 30, 1946  Age: 70 y.o. MRN#: [id number] Attending Physician: Drema Dallasurtis J Woods, MD Primary Care Physician: Frederica KusterMILLER, STEPHEN M, MD Admit Date: 11/22/2016  Reason for Consultation/Follow-up: Establishing goals of care, Non pain symptom management, Pain control, Terminal Care and Withdrawal of life-sustaining treatment  Subjective: Patient with family at bedside.  She seems to have stabilized a bit.  Patient states "I'm tired".  Later in the morning she began having more trouble with her breathing.    Length of Stay: 10  Current Medications: Scheduled Meds:  . amiodarone  200 mg Oral Daily  . budesonide (PULMICORT) nebulizer solution  0.5 mg Nebulization BID  . digoxin  0.125 mg Intravenous Daily  . feeding supplement (ENSURE ENLIVE)  237 mL Oral BID BM  . furosemide  60 mg Intravenous Daily  . ipratropium  0.5 mg Nebulization Q6H  . levalbuterol  0.63 mg Nebulization Q6H  . LORazepam  0.25 mg Intravenous BID  . sodium chloride flush  10-40 mL Intracatheter Q12H  . sodium chloride flush  3 mL Intravenous Q12H    Continuous Infusions:   PRN Meds: [CANCELED] Place/Maintain arterial line **AND** sodium chloride, sodium chloride, levalbuterol, LORazepam, morphine injection, ondansetron **OR** ondansetron (ZOFRAN) IV, sodium chloride flush, sodium chloride flush  Physical Exam  Constitutional:  Frail ill appearing female in NAD.  +Pallor  HENT:  Head: Normocephalic and atraumatic.  Eyes: EOM are normal. No scleral icterus.  Cardiovascular: Normal rate.   Murmur heard. Pulmonary/Chest: She is in respiratory distress.  Mild resp distress  Abdominal: Soft. She exhibits no distension.          Vital Signs:  BP (!) 104/53   Pulse 66   Temp 97.6 F (36.4 C) (Axillary)   Resp 10   Ht 5\' 6"  (1.676 m)   Wt 60.8 kg (134 lb 0.6 oz)   SpO2 100%   BMI 21.63 kg/m  SpO2: SpO2: 100 % O2 Device: O2 Device: High Flow Nasal Cannula O2 Flow Rate: O2 Flow Rate (L/min): 6 L/min  Intake/output summary:  Intake/Output Summary (Last 24 hours) at 12/03/16 0752 Last data filed at 12/03/16 0700  Gross per 24 hour  Intake  630.8 ml  Output             2350 ml  Net          -1719.2 ml   LBM: Last BM Date: 11/27/16 Baseline Weight: Weight: 50.3 kg (111 lb) Most recent weight: Weight: 60.8 kg (134 lb 0.6 oz)       Palliative Assessment/Data:    Flowsheet Rows   Flowsheet Row Most Recent Value  Intake Tab  Referral Department  Hospitalist  Unit at Time of Referral  Intermediate Care Unit  Palliative Care Primary Diagnosis  Pulmonary  Date Notified  12/01/16  Palliative Care Type  New Palliative care  Reason for referral  Clarify Goals of Care, End of Life Care Assistance  Date of Admission  11/22/16  Date first seen by Palliative Care  12/02/16  # of days Palliative referral response time  1 Day(s)  # of days IP prior to Palliative referral  9  Clinical Assessment  Palliative Performance Scale Score  20%  Dyspnea Max Last 24 Hours  6  Dyspnea Min Last 24 hours  4  Anxiety Max Last 24 Hours  6  Anxiety Min Last 24 Hours  4  Psychosocial & Spiritual Assessment  Palliative Care Outcomes  Patient/Family meeting held?  Yes  Palliative Care Outcomes  Improved non-pain symptom therapy, Clarified goals of care, Changed to focus on comfort      Patient Active Problem List   Diagnosis Date Noted  . Palliative care encounter   . Comfort measures only status   . Pulmonary emphysema (HCC)   . Bilateral pleural effusion   . Hypotension   . Goals of care, counseling/discussion   . Major depressive disorder with single episode, in partial remission (HCC)   . Personal history of  noncompliance with medical treatment, presenting hazards to health   . Respiratory acidosis   . Pleural effusion   . Acute respiratory failure with hypoxia (HCC)   . Disorientation   . Acute on chronic respiratory failure with hypercapnia (HCC)   . COPD exacerbation (HCC)   . Pleural effusion, right   . Pulmonary hypertension   . Acute saddle pulmonary embolism with acute cor pulmonale (HCC)   . Hyponatremia   . Hypoxemia   . Acute on chronic respiratory failure (HCC) 11/25/2016  . Atrial fibrillation with rapid ventricular response (HCC) 11/23/2016  . Atrial fibrillation with RVR (HCC) 11/23/2016  . Osteoporosis 09/22/2016  . Allergic rhinitis   . Arthritis   . Osteopenia   . Hyperlipidemia   . Raynaud's disease 06/28/2013  . COPD (chronic obstructive pulmonary disease) (HCC) 11/10/2011    Palliative Care Assessment & Plan   Patient Profile: 70 y.o. female  with past medical history of COPD, PE, Afib, and failure to thrive  who was admitted on 11/22/2016 with chest pain and SOB.  During this hospitalization she was initially admitted to [facility], but transferred to [facility] on 12/23.  She has been unable to wean from BiPap for any extended period of time since admission.  She had acute on chronic respiratory failure on admission from multiple etiologies including possible pneumonia, bilateral pleural effusions, with bilateral upper lobe densities of uncertain significance, advanced COPD, and in recent weeks she had developed afib with RVR.  She was treated with antibiotics and pulmonary support.  She was also seen by cardiology and placed on an amiodarone gtt.  Unfortunately her afib remained uncontrolled and her breathing has not significantly improved despite 11 days  of hospital treatment.  Assessment: On high flow at 7L with sats at 100%. Patient very fatigued.  Pulse rate normal.  Recommendations/Plan:  D/c amio gtt.  Start amio 200 mg daily.  This dose is only for comfort.     Wean from high flow oxygen.  Place her on 6l N/C and stop checking sats.   Will start morphine gtt with prn boluses.   Continue ativan scheduled and PRN If she stabilizes [facility] be [facility].   Goals of Care and Additional Recommendations:  Limitations on Scope of Treatment: Full Comfort Care  Code Status:  DNR  Prognosis:   Hours - Days  Discharge Planning:  Anticipated Hospital Death.  If she is stable on Sat/Sun may consider Hospice House   Care plan was discussed with bedside RN and Prentiss BellsJeff Anglin (Son & HCPOA)  Thank you for allowing the Palliative Medicine Team to assist in the care of this patient.   Time In: 12:00 Time Out: 12;35 Total Time 35 MIN Prolonged Time Billed NO      Greater than 50%  of this time was spent counseling and coordinating care related to the above assessment and plan.  Algis DownsMarianne York, PA-C Palliative Medicine   Please contact Palliative MedicineTeam phone at [phone] for questions and concerns.   Please see AMION for individual provider pager numbers.

## 2016-12-03 NOTE — Progress Notes (Addendum)
Patient transitioned to comfort care on 12/28. PCCM will sign off at this point. Please re-consult if further assistance is needed in management.   Jovita KussmaulKatalina Eubanks, AG-ACNP [place] [facility]  Pgr: [phone]  PCCM Pgr: [phone]  Please call if additional assistance needed.  Coralyn HellingVineet Icholas Irby, MD [facility] 12/03/2016, 12:14 PM Pager:  [phone] After 3pm call: [phone]

## 2016-12-03 NOTE — Progress Notes (Signed)
Received into 6N31, oriented to room, appears comfortable on MS gtt at 1mg /ml. HOB elevated and oriented to room .

## 2016-12-03 NOTE — Progress Notes (Signed)
PROGRESS NOTE    Shannon Jackson  ZOX:[id number] DOB: 1946-01-29 DOA: 11/22/2016 PCP: Frederica Kuster, MD   Brief Narrative:  70 y.o.WF PMHx Atrial fibrillation on Xarelto, Pulmonary Hypertension COPD, PE,Raynaud's syndrome, HLD, GERD,   Who came to the ED with generalized weakness for past 2 days. Patient was seen at [facility] couple of weeks ago and diagnosed with atrial fibrillation and failure to thrive at that time she was admitted to ICU and had several episodes of atrial fibrillation. [facility] and was supposed to go to [place] for long-term care yesterday. Patient signed herself out AMA from [facility] on 11/21/2016 as she was not happy with the care provided at [facility]. At home patient started feeling generalized weakness and was brought to hospital for further evaluation. She denies chest pain, mild shortness of breath. + nausea, no vomiting or diarrhea.  In the ED patient was found to be in A. fib with RVR, cardiology fellow was consulted by ED physician. Patient started on amiodarone infusion, also given diltiazem.   Subjective: 12/29  A/O 4. NAD       Assessment & Plan:   Principal Problem:   Atrial fibrillation with rapid ventricular response (HCC) Active Problems:   COPD (chronic obstructive pulmonary disease) (HCC)   Hyperlipidemia   Acute on chronic respiratory failure (HCC)   Hypoxemia   Disorientation   Acute on chronic respiratory failure with hypercapnia (HCC)   COPD exacerbation (HCC)   Pleural effusion, right   Pulmonary hypertension   Acute saddle pulmonary embolism with acute cor pulmonale (HCC)   Hyponatremia   Major depressive disorder with single episode, in partial remission (HCC)   Personal history of noncompliance with medical treatment, presenting hazards to health   Respiratory acidosis   Pleural effusion   Acute respiratory failure with hypoxia (HCC)   Pulmonary emphysema  (HCC)   Bilateral pleural effusion   Hypotension   Goals of care, counseling/discussion   Palliative care encounter   Comfort measures only status   Altered mental status -Respiratory failure? Hyponatremia? -Patient with increasing PCO2, and decreasing O2 by ABG. -Comfort care  Depression -Appears may have severe depression, consult behavioral health with patient's hyponatremia will not be able restart SSRI. -12/25 consulted psychiatry for medication recommendations -Comfort care  Noncompliance hospital treatment. -Counseled patient that she has the right to refuse any further diagnostic or therapeutic treatment, however refused to do so would result in the continuation of her PCO2 to increase and at some point she would stop breathing. Counseled patient that if she chose this course of action we needed to make her DO NOT RESUSCITATE. Patient decided that she would accept recommended treatment modalities.  -Comfort care  Acute on Chronic Hypercarbic Respiratory Failure -multifactorial in setting of AF w RVR, possible PNA, underlying COPD -Bilateral Pleural Effusions - R>L -Antibiotics discontinued for  HCAP: Comfort care  Respiratory acidosis -Compensated, patient continues to feel uncomfortable and short of breath, most likely secondary to pleural effusion vs her respiratory acidosis.  COPD Exacerbation -Complete five-day course antibiotics. -Continue intermittent bipap support  -Titrate O2 for sats 88-94% -Comfort care  Right pleural effusion -Increasing right pleural effusion patient currently on BiPAP -See A. fib -Comfort care  Atrial Fibrillation w RVR  -Amiodarone 200 mg daily   Pulmonary Hypertension -See A. fib  PE, -Comfort care  HLD  Hyponatremia  -Urine osmolality, urine sodium, serum osmolality, pending -Resolved with use of 3% normal saline and  discontinuation of SSRI  Anemia  - supsect of chronic disease, no evidence of acute bleeding, however  dropped one unit overnight. -Repeat CBC Recent Labs Lab 11/27/16 0045 11/28/16 0900 11/28/16 1630  HGB 9.4* 8.4* 8.5*  -Stop all blood draws comfort care    Goals of care -On 12/26 Will consult PALLIATIVE CARE, discuss CODE STATUS, patient's long-term vs short-term goals of care -12/29 patient family have decided on comfort care     DVT prophylaxis: None Code Status: Comfort care Family Communication: Spoke at length with Steward DroneBrenda (sister) and discuss plan of care Disposition Plan: Comfort care   Consultants:  Cardiology PC CM    Procedures/Significant Events:  12/20 Echocardiogram:Left ventricle: mild LVH. -LVEF=60%.- Tricuspid valve moderate regurgitation. - Pulmonary arteries: PA peak pressure: 60 mm Hg (S).   VENTILATOR SETTINGS: None   Cultures 12/19 MRSA by PCR negative 12/19 blood negative 12/24 respiratory virus panel negative    Antimicrobials:Anti-infectives    Start     Stop   11/25/16 1900  vancomycin (VANCOCIN) IVPB 750 mg/150 ml premix  Status:  Discontinued     11/29/16 1613   11/23/16 1800  vancomycin (VANCOCIN) 500 mg in sodium chloride 0.9 % 100 mL IVPB  Status:  Discontinued     11/25/16 1849   11/23/16 1000  ceFEPIme (MAXIPIME) 1 g in dextrose 5 % 50 mL IVPB     11/30/16 2359   11/23/16 0800  vancomycin (VANCOCIN) IVPB 1000 mg/200 mL premix     11/23/16 [id number]       Devices    LINES / TUBES:      Continuous Infusions: . morphine 1 mg/hr (12/03/16 1600)     Objective: Vitals:   12/03/16 0500 12/03/16 0600 12/03/16 0700 12/03/16 0800  BP: (!) 120/59 123/66 (!) 104/53   Pulse: 69 69 66   Resp: 14 10 10    Temp:    97.7 F (36.5 C)  TempSrc:    Axillary  SpO2: 100% 100% 100%   Weight:      Height:        Intake/Output Summary (Last 24 hours) at 12/03/16 1836 Last data filed at 12/03/16 1600  Gross per 24 hour  Intake           535.92 ml  Output             2000 ml  Net         -1464.08 ml   Filed Weights    11/30/16 0600 12/01/16 0419 12/02/16 0341  Weight: 63.2 kg (139 lb 5.3 oz) 61.4 kg (135 lb 5.8 oz) 60.8 kg (134 lb 0.6 oz)    Examination:  General: A/O 4, positive acute on chronic respiratory distress Eyes: negative scleral hemorrhage, negative anisocoria, negative icterus ENT: Negative Runny nose, negative gingival bleeding, Neck:  Negative scars, masses, torticollis, lymphadenopathy, JVD Lungs: Absent breath sounds bibasilar, clear to auscultation main lung fields,without wheezes or crackles Cardiovascular: Regular rhythm and rate, without murmur gallop or rub normal S1 and S2 Abdomen: negative abdominal pain, nondistended, positive soft, bowel sounds, no rebound, no ascites, no appreciable mass Extremities: No significant cyanosis, clubbing, or edema bilateral lower extremities Skin: Negative rashes, lesions, ulcers Psychiatric:  Appears severely depressed Central nervous system:  Cranial nerves II through XII intact, tongue/uvula midline. Unable to fully evaluate secondary to patient's altered mental status  .     Data Reviewed: Care during the described time interval was provided by me .  I have reviewed this patient's available  data, including medical history, events of note, physical examination, and all test results as part of my evaluation. I have personally reviewed and interpreted all radiology studies.  CBC:  Recent Labs Lab 11/27/16 0045 11/28/16 0900 11/28/16 1630  WBC 10.5 10.1 10.6*  HGB 9.4* 8.4* 8.5*  HCT 28.5* 25.9* 25.9*  MCV 97.6 94.9 94.9  PLT 135* 152 151   Basic Metabolic Panel:  Recent Labs Lab 11/27/16 0045  11/28/16 0900 11/28/16 1630  11/29/16 0435  11/29/16 1025 11/29/16 1232 11/29/16 1432 11/30/16 0212 12/01/16 0434  NA  --   < > 125* 127*  < > 132*  < > 135 136 133* 134* 134*  K  --   --  3.5 3.2*  --  3.7  --   --   --   --  3.9 3.4*  CL  --   --  78* 76*  --  83*  --   --   --   --  83* 75*  CO2  --   --  40* 42*  --  42*  --    --   --   --  47* 50*  GLUCOSE  --   --  206* 149*  --  165*  --   --   --   --  91 128*  BUN  --   --  8 11  --  14  --   --   --   --  15 13  CREATININE  --   --  0.32* 0.35*  --  0.38*  --   --   --   --  <0.30* 0.35*  CALCIUM  --   --  7.7* 8.2*  --  7.8*  --   --   --   --  8.5* 8.2*  MG 1.5*  --   --  2.0  --  1.8  --   --   --   --  2.1 1.9  < > = values in this interval not displayed. GFR: Estimated Creatinine Clearance: 60.4 mL/min (by C-G formula based on SCr of 0.35 mg/dL (L)). Liver Function Tests:  Recent Labs Lab 11/28/16 1630  AST 27  ALT 51  ALKPHOS 61  BILITOT 0.5  PROT 4.2*  ALBUMIN 2.6*   No results for input(s): LIPASE, AMYLASE in the last 168 hours.  Recent Labs Lab 11/27/16 0045 11/28/16 1618  AMMONIA 51* 27   Coagulation Profile: No results for input(s): INR, PROTIME in the last 168 hours. Cardiac Enzymes:  Recent Labs Lab 11/27/16 0045  TROPONINI <0.03   BNP (last 3 results) No results for input(s): PROBNP in the last 8760 hours. HbA1C: No results for input(s): HGBA1C in the last 72 hours. CBG:  Recent Labs Lab 12/01/16 1648  GLUCAP 118*   Lipid Profile: No results for input(s): CHOL, HDL, LDLCALC, TRIG, CHOLHDL, LDLDIRECT in the last 72 hours. Thyroid Function Tests: No results for input(s): TSH, T4TOTAL, FREET4, T3FREE, THYROIDAB in the last 72 hours. Anemia Panel: No results for input(s): VITAMINB12, FOLATE, FERRITIN, TIBC, IRON, RETICCTPCT in the last 72 hours. Urine analysis:    Component Value Date/Time   COLORURINE YELLOW 11/23/2016 0226   APPEARANCEUR HAZY (A) 11/23/2016 0226   LABSPEC 1.016 11/23/2016 0226   PHURINE 8.0 11/23/2016 0226   GLUCOSEU NEGATIVE 11/23/2016 0226   HGBUR NEGATIVE 11/23/2016 0226   BILIRUBINUR NEGATIVE 11/23/2016 0226   KETONESUR NEGATIVE 11/23/2016 0226   PROTEINUR 30 (A) 11/23/2016 0226  NITRITE NEGATIVE 11/23/2016 0226   LEUKOCYTESUR NEGATIVE 11/23/2016 0226   Sepsis  Labs: @LABRCNTIP (procalcitonin:4,lacticidven:4)  ) Recent Results (from the past 240 hour(s))  MRSA PCR Screening     Status: None   Collection Time: 11/27/16 12:27 PM  Result Value Ref Range Status   MRSA by PCR NEGATIVE NEGATIVE Final    Comment:        The GeneXpert MRSA Assay (FDA approved for NASAL specimens only), is one component of a comprehensive MRSA colonization surveillance program. It is not intended to diagnose MRSA infection nor to guide or monitor treatment for MRSA infections.   Respiratory Panel by PCR     Status: None   Collection Time: 11/29/16  3:30 AM  Result Value Ref Range Status   Adenovirus NOT DETECTED NOT DETECTED Final   Coronavirus 229E NOT DETECTED NOT DETECTED Final   Coronavirus HKU1 NOT DETECTED NOT DETECTED Final   Coronavirus NL63 NOT DETECTED NOT DETECTED Final   Coronavirus OC43 NOT DETECTED NOT DETECTED Final   Metapneumovirus NOT DETECTED NOT DETECTED Final   Rhinovirus / Enterovirus NOT DETECTED NOT DETECTED Final   Influenza A NOT DETECTED NOT DETECTED Final   Influenza B NOT DETECTED NOT DETECTED Final   Parainfluenza Virus 1 NOT DETECTED NOT DETECTED Final   Parainfluenza Virus 2 NOT DETECTED NOT DETECTED Final   Parainfluenza Virus 3 NOT DETECTED NOT DETECTED Final   Parainfluenza Virus 4 NOT DETECTED NOT DETECTED Final   Respiratory Syncytial Virus NOT DETECTED NOT DETECTED Final   Bordetella pertussis NOT DETECTED NOT DETECTED Final   Chlamydophila pneumoniae NOT DETECTED NOT DETECTED Final   Mycoplasma pneumoniae NOT DETECTED NOT DETECTED Final         Radiology Studies: No results found.      Scheduled Meds: . amiodarone  200 mg Oral Daily  . feeding supplement (ENSURE ENLIVE)  237 mL Oral BID BM  . furosemide  60 mg Intravenous Daily  . LORazepam  0.5 mg Intravenous BID  . sodium chloride flush  10-40 mL Intracatheter Q12H  . sodium chloride flush  3 mL Intravenous Q12H   Continuous Infusions: . morphine 1  mg/hr (12/03/16 1600)     LOS: 10 days    Time spent: 40 minutes    WOODS, Roselind Messier, MD [facility] Pager [phone]   If 7PM-7AM, please contact night-coverage [email or web address] Password [facility] 12/03/2016, 6:36 PM

## 2016-12-04 DIAGNOSIS — Z515 Encounter for palliative care: Secondary | ICD-10-CM

## 2016-12-04 NOTE — Progress Notes (Signed)
Daily Progress Note   Patient Name: Shannon Jackson       Date: 12/04/2016 DOB: 1946-09-26  Age: 70 y.o. MRN#: [id number] Attending Physician: Domenic Polite, MD Primary Care Physician: Wardell Honour, MD Admit Date: 11/22/2016  Reason for Consultation/Follow-up: Establishing goals of care, Non pain symptom management, Pain control, Terminal Care and Withdrawal of life-sustaining treatment  Subjective: I met today at Ms. Panebianco's bedside. She is asleep and resting. On morphine infusion and family at bedside. Son says he is confused as someone came in talking about her going to SNF and having rehab. I explained that this was a mistake and I am confused as to how/why that happened as well. Reassured that this is not the case and plan is still for comfort as they have previously discussed. Provided reassurance to them. If continues to stablize may consider hospice facility - may discuss with family tomorrow.    Length of Stay: 11  Current Medications: Scheduled Meds:  . amiodarone  200 mg Oral Daily  . feeding supplement (ENSURE ENLIVE)  237 mL Oral BID BM  . furosemide  60 mg Intravenous Daily  . LORazepam  0.5 mg Intravenous BID  . sodium chloride flush  10-40 mL Intracatheter Q12H  . sodium chloride flush  3 mL Intravenous Q12H    Continuous Infusions: . morphine 1 mg/hr (12/03/16 1600)    PRN Meds: [CANCELED] Place/Maintain arterial line **AND** sodium chloride, sodium chloride, ipratropium-albuterol, levalbuterol, LORazepam, morphine, ondansetron **OR** ondansetron (ZOFRAN) IV, sodium chloride flush, sodium chloride flush  Physical Exam  Constitutional:  Frail ill appearing female in NAD.  +Pallor  HENT:  Head: Normocephalic and atraumatic.  Eyes: EOM are normal. No scleral  icterus.  Cardiovascular: Normal rate.   Murmur heard. Pulmonary/Chest: She is in respiratory distress.  Mild resp distress  Abdominal: Soft. She exhibits no distension.          Vital Signs: BP (!) 105/53 (BP Location: Left Arm)   Pulse 94   Temp 97.8 F (36.6 C) (Oral)   Resp 12   Ht '5\' 6"'$  (1.676 m)   Wt 60.8 kg (134 lb 0.6 oz)   SpO2 98%   BMI 21.63 kg/m  SpO2: SpO2: 98 % O2 Device: O2 Device: Nasal Cannula O2 Flow Rate: O2 Flow Rate (L/min): 5 L/min  Intake/output summary:  Intake/Output Summary (Last 24 hours) at 12/04/16 1440 Last data filed at 12/04/16 0947  Gross per 24 hour  Intake           132.02 ml  Output              350 ml  Net          -217.98 ml   LBM: Last BM Date: 11/27/16 Baseline Weight: Weight: 50.3 kg (111 lb) Most recent weight: Weight: 60.8 kg (134 lb 0.6 oz)       Palliative Assessment/Data: PPS: 10%   Flowsheet Rows   Flowsheet Row Most Recent Value  Intake Tab  Referral Department  Hospitalist  Unit at Time of Referral  Intermediate Care Unit  Palliative Care Primary Diagnosis  Pulmonary  Date Notified  12/01/16  Palliative Care Type  New Palliative care  Reason for referral  Clarify Goals of Care, End of Life Care Assistance  Date of Admission  11/22/16  Date first seen by Palliative Care  12/02/16  # of days Palliative referral response time  1 Day(s)  # of days IP prior to Palliative referral  9  Clinical Assessment  Palliative Performance Scale Score  20%  Dyspnea Max Last 24 Hours  6  Dyspnea Min Last 24 hours  4  Anxiety Max Last 24 Hours  6  Anxiety Min Last 24 Hours  4  Psychosocial & Spiritual Assessment  Palliative Care Outcomes  Patient/Family meeting held?  Yes  Palliative Care Outcomes  Improved non-pain symptom therapy, Clarified goals of care, Changed to focus on comfort      Patient Active Problem List   Diagnosis Date Noted  . Palliative care encounter   . Comfort measures only status   . Pulmonary  emphysema ([place])   . Bilateral pleural effusion   . Hypotension   . Goals of care, counseling/discussion   . Major depressive disorder with single episode, in partial remission ([place])   . Personal history of noncompliance with medical treatment, presenting hazards to health   . Respiratory acidosis   . Pleural effusion   . Acute respiratory failure with hypoxia ([place])   . Disorientation   . Acute on chronic respiratory failure with hypercapnia ([place])   . COPD exacerbation ([place])   . Pleural effusion, right   . Pulmonary hypertension   . Acute saddle pulmonary embolism with acute cor pulmonale (HCC)   . Hyponatremia   . Hypoxemia   . Acute on chronic respiratory failure ([place]) 11/25/2016  . Atrial fibrillation with rapid ventricular response ([place]) 11/23/2016  . Atrial fibrillation with RVR ([place]) 11/23/2016  . Osteoporosis 09/22/2016  . Allergic rhinitis   . Arthritis   . Osteopenia   . Hyperlipidemia   . Raynaud's disease 06/28/2013  . COPD (chronic obstructive pulmonary disease) ([place]) 11/10/2011    Palliative Care Assessment & Plan   Patient Profile: 70 y.o. female  with past medical history of COPD, PE, Afib, and failure to thrive  who was admitted on 11/22/2016 with chest pain and SOB.  During this hospitalization she was initially admitted to [facility], but transferred to [facility] on 12/23.  She has been unable to wean from BiPap for any extended period of time since admission.  She had acute on chronic respiratory failure on admission from multiple etiologies including possible pneumonia, bilateral pleural effusions, with bilateral upper lobe densities of uncertain significance, advanced COPD, and in recent weeks she had developed afib with RVR.  She was treated with  antibiotics and pulmonary support.  She was also seen by cardiology and placed on an amiodarone gtt.  Unfortunately her afib remained uncontrolled and her breathing has not significantly improved despite 11 days of hospital  treatment. Subsequently has been transitioned to comfort care.   Assessment: On [place] 5L. Patient sleeping and appears comfortable.   Recommendations/Plan:  D/c amio gtt.  Start amio 200 mg daily.  This dose is only for comfort.   Wean from high flow oxygen.  Place her on 6l N/C and stop checking sats.   Will start morphine gtt with prn boluses.   Continue ativan scheduled and PRN If she stabilizes she may be able to go to [facility]   Goals of Care and Additional Recommendations:  Limitations on Scope of Treatment: Full Comfort Care  Code Status:  DNR  Prognosis:   Hours - Days  Discharge Planning:  Anticipated Hospital Death.  If she is stable on Sat/Sun may consider [place] was discussed with bedside RN and [name] (Son & HCPOA)  Thank you for allowing the Palliative Medicine Team to assist in the care of this patient.   Time In: 1430 Time Out: 1445 Total Time 15 MIN Prolonged Time Billed NO      Greater than 50%  of this time was spent counseling and coordinating care related to the above assessment and plan.  Vinie Sill, NP Palliative Medicine Team Pager # [phone] (M-F 8a-5p) Team Phone # [phone] (Nights/Weekends)   Please contact Palliative MedicineTeam phone at [phone] for questions and concerns.   Please see AMION for individual provider pager numbers.

## 2016-12-04 NOTE — Progress Notes (Signed)
PROGRESS NOTE    Shannon Jackson  ZOX:[id number] DOB: August 14, 1946 DOA: 11/22/2016 PCP: Frederica Kuster, MD  Brief Narrative: 70 y.o.WF PMHx Atrial fibrillation on Xarelto, Pulmonary Hypertension COPD, PE,Raynaud's syndrome, HLD, GERD, who came to the ED with generalized weakness for past 2 days. Patient was seen at [facility] couple of weeks ago and diagnosed with atrial fibrillation and failure to thrive at that time she was admitted to ICU and had several episodes of atrial fibrillation. [facility] and was supposed to go to [place] for long-term care yesterday.Patient signed herself out AMA from [facility] on 11/21/2016 as she was not happy with the care provided at [facility]. At home patient started feeling generalized weakness and was brought to hospital for further evaluation. In the ED patient was found to be in A. fib with RVR, Resp failure, started on amio infusion  Assessment & Plan:   Principal Problem:   Atrial fibrillation with rapid ventricular response (HCC) Active Problems:   COPD (chronic obstructive pulmonary disease) (HCC)   Hyperlipidemia   Acute on chronic respiratory failure (HCC)   Hypoxemia   Disorientation   Acute on chronic respiratory failure with hypercapnia (HCC)   COPD exacerbation (HCC)   Pleural effusion, right   Pulmonary hypertension   Acute saddle pulmonary embolism with acute cor pulmonale (HCC)   Hyponatremia   Major depressive disorder with single episode, in partial remission (HCC)   Personal history of noncompliance with medical treatment, presenting hazards to health   Respiratory acidosis   Pleural effusion   Acute respiratory failure with hypoxia (HCC)   Pulmonary emphysema (HCC)   Bilateral pleural effusion   Hypotension   Goals of care, counseling/discussion   Palliative care encounter   Comfort measures only status  -Followed by Pulmonary as well, due to poor response to Tx,  underwent palliative care meeting on 12/28, now comfort care on morphine gtt. -may need residential hospice if she doesn't decline soon  DVT prophylaxis: comfort care Code Status: DNR Family Communication: family at bedside Disposition Plan: expect hospital demise   Consultants:   PCCM   Procedures:    Subjective:   Objective: Vitals:   12/03/16 0700 12/03/16 0800 12/03/16 1837 12/04/16 0701  BP: (!) 104/53  (!) 106/53 (!) 105/53  Pulse: 66  83 94  Resp: 10  12   Temp:  97.7 F (36.5 C) 97.8 F (36.6 C) 97.8 F (36.6 C)  TempSrc:  Axillary Axillary Oral  SpO2: 100%  100% 98%  Weight:      Height:        Intake/Output Summary (Last 24 hours) at 12/04/16 1503 Last data filed at 12/04/16 0947  Gross per 24 hour  Intake           132.02 ml  Output              350 ml  Net          -217.98 ml   Filed Weights   11/30/16 0600 12/01/16 0419 12/02/16 0341  Weight: 63.2 kg (139 lb 5.3 oz) 61.4 kg (135 lb 5.8 oz) 60.8 kg (134 lb 0.6 oz)    Examination:  General exam: poorly responsive but comfortable  Respiratory system: decreased BS, poor air movement Cardiovascular system: S1 & S2 heard, RRR Gastrointestinal system: Abdomen is nondistended, soft and nontender.Normal bowel sounds heard. Central nervous system:obtunded Extremities: unable to assess Skin: No rashes, lesions or ulcers Psychiatry: unable to assess  Data Reviewed: I have personally reviewed following labs and imaging studies  CBC:  Recent Labs Lab 11/28/16 0900 11/28/16 1630  WBC 10.1 10.6*  HGB 8.4* 8.5*  HCT 25.9* 25.9*  MCV 94.9 94.9  PLT 152 151   Basic Metabolic Panel:  Recent Labs Lab 11/28/16 0900 11/28/16 1630  11/29/16 0435  11/29/16 1025 11/29/16 1232 11/29/16 1432 11/30/16 0212 12/01/16 0434  NA 125* 127*  < > 132*  < > 135 136 133* 134* 134*  K 3.5 3.2*  --  3.7  --   --   --   --  3.9 3.4*  CL 78* 76*  --  83*  --   --   --   --  83* 75*  CO2 40* 42*  --  42*   --   --   --   --  47* 50*  GLUCOSE 206* 149*  --  165*  --   --   --   --  91 128*  BUN 8 11  --  14  --   --   --   --  15 13  CREATININE 0.32* 0.35*  --  0.38*  --   --   --   --  <0.30* 0.35*  CALCIUM 7.7* 8.2*  --  7.8*  --   --   --   --  8.5* 8.2*  MG  --  2.0  --  1.8  --   --   --   --  2.1 1.9  < > = values in this interval not displayed. GFR: Estimated Creatinine Clearance: 60.4 mL/min (by C-G formula based on SCr of 0.35 mg/dL (L)). Liver Function Tests:  Recent Labs Lab 11/28/16 1630  AST 27  ALT 51  ALKPHOS 61  BILITOT 0.5  PROT 4.2*  ALBUMIN 2.6*   No results for input(s): LIPASE, AMYLASE in the last 168 hours.  Recent Labs Lab 11/28/16 1618  AMMONIA 27   Coagulation Profile: No results for input(s): INR, PROTIME in the last 168 hours. Cardiac Enzymes: No results for input(s): CKTOTAL, CKMB, CKMBINDEX, TROPONINI in the last 168 hours. BNP (last 3 results) No results for input(s): PROBNP in the last 8760 hours. HbA1C: No results for input(s): HGBA1C in the last 72 hours. CBG:  Recent Labs Lab 12/01/16 1648  GLUCAP 118*   Lipid Profile: No results for input(s): CHOL, HDL, LDLCALC, TRIG, CHOLHDL, LDLDIRECT in the last 72 hours. Thyroid Function Tests: No results for input(s): TSH, T4TOTAL, FREET4, T3FREE, THYROIDAB in the last 72 hours. Anemia Panel: No results for input(s): VITAMINB12, FOLATE, FERRITIN, TIBC, IRON, RETICCTPCT in the last 72 hours. Urine analysis:    Component Value Date/Time   COLORURINE YELLOW 11/23/2016 0226   APPEARANCEUR HAZY (A) 11/23/2016 0226   LABSPEC 1.016 11/23/2016 0226   PHURINE 8.0 11/23/2016 0226   GLUCOSEU NEGATIVE 11/23/2016 0226   HGBUR NEGATIVE 11/23/2016 0226   BILIRUBINUR NEGATIVE 11/23/2016 0226   KETONESUR NEGATIVE 11/23/2016 0226   PROTEINUR 30 (A) 11/23/2016 0226   NITRITE NEGATIVE 11/23/2016 0226   LEUKOCYTESUR NEGATIVE 11/23/2016 0226   Sepsis  Labs: @LABRCNTIP (procalcitonin:4,lacticidven:4)  ) Recent Results (from the past 240 hour(s))  MRSA PCR Screening     Status: None   Collection Time: 11/27/16 12:27 PM  Result Value Ref Range Status   MRSA by PCR NEGATIVE NEGATIVE Final    Comment:        The GeneXpert MRSA Assay (FDA approved for NASAL specimens only), is  one component of a comprehensive MRSA colonization surveillance program. It is not intended to diagnose MRSA infection nor to guide or monitor treatment for MRSA infections.   Respiratory Panel by PCR     Status: None   Collection Time: 11/29/16  3:30 AM  Result Value Ref Range Status   Adenovirus NOT DETECTED NOT DETECTED Final   Coronavirus 229E NOT DETECTED NOT DETECTED Final   Coronavirus HKU1 NOT DETECTED NOT DETECTED Final   Coronavirus NL63 NOT DETECTED NOT DETECTED Final   Coronavirus OC43 NOT DETECTED NOT DETECTED Final   Metapneumovirus NOT DETECTED NOT DETECTED Final   Rhinovirus / Enterovirus NOT DETECTED NOT DETECTED Final   Influenza A NOT DETECTED NOT DETECTED Final   Influenza B NOT DETECTED NOT DETECTED Final   Parainfluenza Virus 1 NOT DETECTED NOT DETECTED Final   Parainfluenza Virus 2 NOT DETECTED NOT DETECTED Final   Parainfluenza Virus 3 NOT DETECTED NOT DETECTED Final   Parainfluenza Virus 4 NOT DETECTED NOT DETECTED Final   Respiratory Syncytial Virus NOT DETECTED NOT DETECTED Final   Bordetella pertussis NOT DETECTED NOT DETECTED Final   Chlamydophila pneumoniae NOT DETECTED NOT DETECTED Final   Mycoplasma pneumoniae NOT DETECTED NOT DETECTED Final         Radiology Studies: No results found.      Scheduled Meds: . amiodarone  200 mg Oral Daily  . feeding supplement (ENSURE ENLIVE)  237 mL Oral BID BM  . furosemide  60 mg Intravenous Daily  . LORazepam  0.5 mg Intravenous BID  . sodium chloride flush  10-40 mL Intracatheter Q12H  . sodium chloride flush  3 mL Intravenous Q12H   Continuous Infusions: . morphine 1  mg/hr (12/03/16 1600)     LOS: 11 days    Time spent: 35min    Zannie CovePreetha Etta Gassett, MD [facility] Pager [phone]  If 7PM-7AM, please contact night-coverage [email or web address] Password [email or web address] 12/04/2016, 3:03 PM

## 2016-12-04 NOTE — Clinical Social Work Placement (Deleted)
   CLINICAL SOCIAL WORK PLACEMENT  NOTE  Date:  12/04/2016  Patient Details  Name: Shannon Jackson MRN: [id number] Date of Birth: 1946-05-07  Clinical Social Work is seeking post-discharge placement for this patient at the Skilled  Nursing Facility level of care (*CSW will initial, date and re-position this form in  chart as items are completed):      Patient/family provided with [facility] Clinical Social Work Department's list of facilities offering this level of care within the geographic area requested by the patient (or if unable, by the patient's family).      Patient/family informed of their freedom to choose among providers that offer the needed level of care, that participate in Medicare, Medicaid or managed care program needed by the patient, have an available bed and are willing to accept the patient.      Patient/family informed of [facility]'s ownership interest in [facility] and [facility], as well as of the fact that they are under no obligation to receive care at these facilities.  PASRR submitted to EDS on       PASRR number received on 12/04/16     Existing PASRR number confirmed on       FL2 transmitted to all facilities in geographic area requested by pt/family on 12/04/16     FL2 transmitted to all facilities within larger geographic area on       Patient informed that his/her managed care company has contracts with or will negotiate with certain facilities, including the following:            Patient/family informed of bed offers received.  Patient chooses bed at       Physician recommends and patient chooses bed at      Patient to be transferred to   on  .  Patient to be transferred to facility by       Patient family notified on   of transfer.  Name of family member notified:        PHYSICIAN Please prepare priority discharge summary, including medications, Please prepare prescriptions, Please sign DNR, Please sign FL2     Additional  Comment:    _______________________________________________ Volney AmericanBridget A Mayton, LCSW 12/04/2016, 1:25 PM

## 2016-12-04 NOTE — Clinical Social Work Note (Signed)
Per Palliative pt is comfort care and if needs placement it will be hospice. Clinical Social Worker will sign off for now as social work intervention is no longer needed. Please consult us again if new need arises.   [address]Shannon Jackson, [place] [phone]

## 2016-12-04 NOTE — [place] FL2 (Deleted)
[place] MEDICAID FL2 LEVEL OF CARE SCREENING TOOL     IDENTIFICATION  Patient Name: Shannon Jackson Birthdate: [birth year] Sex: female Admission Date (Current Location): 11/22/2016  [facility] [place]   Facility and Address:  [facility], [address], [place], [place] [address]      Provider Number: [id number]  Attending Physician Name and Address:  Zannie CovePreetha Joseph, MD  Relative Name and Phone Number:       Current Level of Care: Hospital Recommended Level of Care: Skilled Nursing Facility Prior Approval Number:    Date Approved/Denied: 12/04/16 PASRR Number: [id number]   Discharge Plan: SNF    Current Diagnoses: Patient Active Problem List   Diagnosis Date Noted  . Palliative care encounter   . Comfort measures only status   . Pulmonary emphysema (HCC)   . Bilateral pleural effusion   . Hypotension   . Goals of care, counseling/discussion   . Major depressive disorder with single episode, in partial remission (HCC)   . Personal history of noncompliance with medical treatment, presenting hazards to health   . Respiratory acidosis   . Pleural effusion   . Acute respiratory failure with hypoxia (HCC)   . Disorientation   . Acute on chronic respiratory failure with hypercapnia (HCC)   . COPD exacerbation (HCC)   . Pleural effusion, right   . Pulmonary hypertension   . Acute saddle pulmonary embolism with acute cor pulmonale (HCC)   . Hyponatremia   . Hypoxemia   . Acute on chronic respiratory failure (HCC) 11/25/2016  . Atrial fibrillation with rapid ventricular response (HCC) 11/23/2016  . Atrial fibrillation with RVR (HCC) 11/23/2016  . Osteoporosis 09/22/2016  . Allergic rhinitis   . Arthritis   . Osteopenia   . Hyperlipidemia   . Raynaud's disease 06/28/2013  . COPD (chronic obstructive pulmonary disease) (HCC) 11/10/2011    Orientation RESPIRATION BLADDER Height & Weight     Self, Situation, Place  O2  (Nasal Cannula; 5L) Incontinent, Indwelling catheter (Double-lumen, Tube size 14 fr, balloon size 10mL. placed 12/19) Weight: 134 lb 0.6 oz (60.8 kg) Height:  5\' 6"  (167.6 cm)  BEHAVIORAL SYMPTOMS/MOOD NEUROLOGICAL BOWEL NUTRITION STATUS      Continent  (Please see discharge summary)  AMBULATORY STATUS COMMUNICATION OF NEEDS Skin   Limited Assist Verbally Normal                       Personal Care Assistance Level of Assistance  Bathing, Feeding, Dressing Bathing Assistance: Limited assistance Feeding assistance: Independent Dressing Assistance: Limited assistance     Functional Limitations Info  Sight, Hearing, Speech Sight Info: Adequate Hearing Info: Adequate Speech Info: Adequate    SPECIAL CARE FACTORS FREQUENCY  PT (By licensed PT), OT (By licensed OT)     PT Frequency: 3x week OT Frequency: 3x week            Contractures Contractures Info: Not present    Additional Factors Info  Allergies Code Status Info: DNR Allergies Info: Codeine, Levofloxacin, Lovenox Enoxaparin Sodium           Current Medications (12/04/2016):  This is the current hospital active medication list Current Facility-Administered Medications  Medication Dose Route Frequency Provider Last Rate Last Dose  . 0.9 %  sodium chloride infusion   Intra-arterial PRN Eduard ClosArshad N Kakrakandy, MD 10 mL/hr at 12/03/16 1600    . 0.9 %  sodium chloride infusion  250 mL Intravenous PRN Clerance LavMarianne  L York, PA-C      . amiodarone (PACERONE) tablet 200 mg  200 mg Oral Daily Stephani PoliceMarianne L York, PA-C   200 mg at 12/03/16 [id number]  . feeding supplement (ENSURE ENLIVE) (ENSURE ENLIVE) liquid 237 mL  237 mL Oral BID BM Belkys A Regalado, MD   237 mL at 12/03/16 1400  . furosemide (LASIX) injection 60 mg  60 mg Intravenous Daily Drema Dallasurtis J Woods, MD   60 mg at 12/03/16 [id number]  . ipratropium-albuterol (DUONEB) 0.5-2.5 (3) MG/3ML nebulizer solution 3 mL  3 mL Nebulization Q4H PRN Stephani PoliceMarianne L York, PA-C      . levalbuterol  (XOPENEX) nebulizer solution 0.63 mg  0.63 mg Nebulization Q6H PRN Meredeth IdeGagan S Lama, MD   0.63 mg at 11/25/16 0752  . LORazepam (ATIVAN) injection 0.5 mg  0.5 mg Intravenous BID Stephani PoliceMarianne L York, PA-C   0.5 mg at 12/03/16 2226  . LORazepam (ATIVAN) injection 0.5-1 mg  0.5-1 mg Intravenous Q6H PRN Belkys A Regalado, MD   0.5 mg at 12/01/16 1228  . morphine 250 mg in sodium chloride 0.9 % 250 mL (1 mg/mL) infusion  1-5 mg/hr Intravenous Continuous Stephani PoliceMarianne L York, PA-C 1 mL/hr at 12/03/16 1600 1 mg/hr at 12/03/16 1600  . morphine bolus via infusion 2-4 mg  2-4 mg Intravenous Q15 min PRN Stephani PoliceMarianne L York, PA-C      . ondansetron (ZOFRAN) tablet 4 mg  4 mg Oral Q6H PRN Meredeth IdeGagan S Lama, MD       Or  . ondansetron (ZOFRAN) injection 4 mg  4 mg Intravenous Q6H PRN Meredeth IdeGagan S Lama, MD   4 mg at 11/26/16 1659  . sodium chloride flush (NS) 0.9 % injection 10-40 mL  10-40 mL Intracatheter Q12H Penny Piarlando Vega, MD   30 mL at 12/03/16 0939  . sodium chloride flush (NS) 0.9 % injection 10-40 mL  10-40 mL Intracatheter PRN Penny Piarlando Vega, MD   10 mL at 11/30/16 0921  . sodium chloride flush (NS) 0.9 % injection 3 mL  3 mL Intravenous Q12H Stephani PoliceMarianne L York, PA-C   3 mL at 12/03/16 0939  . sodium chloride flush (NS) 0.9 % injection 3 mL  3 mL Intravenous PRN Stephani PoliceMarianne L York, PA-C         Discharge Medications: Please see discharge summary for a list of discharge medications.  Relevant Imaging Results:  Relevant Lab Results:   Additional Information SSN: [id number]  Ht: 5\' 6"  (1.676 m) Wt: 134 lb 0.6 oz (60.8 kg)  Mako Pelfrey A Mayton, LCSW

## 2016-12-04 NOTE — Clinical Social Work Note (Deleted)
CSW spoke with pt at bedside. Pt's son and sister present at bedside. Pt did not have much to say in response to CSW. Pt's son and sister report they live in [place] and [place] a convenient facility due to location. [facility].  [address]Henrik Orihuela Mayton, [place] [phone]

## 2016-12-05 DIAGNOSIS — R251 Tremor, unspecified: Secondary | ICD-10-CM

## 2016-12-05 MED ORDER — POLYETHYLENE GLYCOL 3350 17 G PO PACK: 17.0000 g | PACK | Freq: Every day | ORAL | Status: DC | PRN

## 2016-12-05 MED ORDER — BISACODYL 10 MG RE SUPP
10.0000 mg | Freq: Once | RECTAL | Status: AC
  Administered 2016-12-05: 10 mg via RECTAL
  Filled 2016-12-05: qty 1

## 2016-12-05 MED ORDER — LORAZEPAM 2 MG/ML IJ SOLN
0.5000 mg | Freq: Three times a day (TID) | INTRAMUSCULAR | Status: DC
  Administered 2016-12-05 – 2016-12-06 (×2): 0.5 mg via INTRAVENOUS
  Filled 2016-12-05 (×2): qty 1

## 2016-12-05 MED ORDER — MAGIC MOUTHWASH W/LIDOCAINE
5.0000 mL | Freq: Three times a day (TID) | ORAL | Status: DC | PRN
  Administered 2016-12-05: 5 mL via ORAL
  Filled 2016-12-05 (×2): qty 5

## 2016-12-05 MED ORDER — MORPHINE SULFATE (CONCENTRATE) 10 MG /0.5 ML PO SOLN: 5.0000 mg | ORAL | 0 refills | Status: AC | PRN

## 2016-12-05 MED ORDER — WHITE PETROLATUM GEL
Status: AC
  Filled 2016-12-05: qty 1

## 2016-12-05 MED ORDER — SODIUM CHLORIDE 0.9 % IV SOLN: 1.0000 mg/h | INTRAVENOUS | Status: AC

## 2016-12-05 NOTE — Discharge Summary (Addendum)
Physician Discharge Summary  Shannon Jackson ZOX:[id number] DOB: 07-18-1946 DOA: 11/22/2016  PCP: Frederica Kuster, MD  Admit date: 11/22/2016 Discharge date: 12/06/2016  Time spent: 45 minutes  Recommendations for Outpatient Follow-up:  1. Residential hospice for Comfort care   Discharge Diagnoses:  Principal Problem:   Atrial fibrillation with rapid ventricular response (HCC) Active Problems:   COPD (chronic obstructive pulmonary disease) (HCC)   Hyperlipidemia   Acute on chronic respiratory failure (HCC)   Hypoxemia   Disorientation   Acute on chronic respiratory failure with hypercapnia (HCC)   COPD exacerbation (HCC)   Pleural effusion, right   Pulmonary hypertension   Acute saddle pulmonary embolism with acute cor pulmonale (HCC)   Hyponatremia   Major depressive disorder with single episode, in partial remission (HCC)   Personal history of noncompliance with medical treatment, presenting hazards to health   Respiratory acidosis   Pleural effusion   Acute respiratory failure with hypoxia (HCC)   Pulmonary emphysema (HCC)   Bilateral pleural effusion   Hypotension   Goals of care, counseling/discussion   Palliative care encounter   Comfort measures only status   Terminal care   Tremor   Discharge Condition: poor  Diet recommendation: comfort feeds  Filed Weights   11/30/16 0600 12/01/16 0419 12/02/16 0341  Weight: 63.2 kg (139 lb 5.3 oz) 61.4 kg (135 lb 5.8 oz) 60.8 kg (134 lb 0.6 oz)    History of present illness:  70 y.o.WF PMHx Atrial fibrillation on Xarelto, Pulmonary Hypertension COPD, PE,Raynaud's syndrome, HLD, GERD, who came to the ED with generalized weakness for past 2 days. Patient was seen at [facility] couple of weeks ago and diagnosed with atrial fibrillation and failure to thrive at that time she was admitted to ICU and had several episodes of atrial fibrillation. [facility] and was supposed to go  to [place] for long-term care yesterday.Patient signed herself out AMA from [facility] on 11/21/2016 as she was not happy with the care provided at [facility]. At home patient started feeling generalized weakness and was brought to hospital for further evaluation. In the ED patient was found to be in A. fib with RVR, Resp failure, started on amio infusion  Hospital Course:  Acute on chronic hypoxic and hypercapnic respiratory failure:  -Multifactorial etiology including possible pneumonia, underlying COPD, bilateral pleural effusion, atrial fibrillation with RVR.-Patient already completed a course of antibiotics for pneumonia. -No clinical improvement despite medical treatment, She was followed in stepdown/ICU by pulmonary critical care team as well due to poor response to therapy including BIPAP -she had a Palliative care consult and s/p family meeting and deicion was made for Comfort care and started on a morphine gtt for comfort. At this time plan for residential hospice for Comfort care  Consultations:  PCCM/palliative care  Discharge Exam: Vitals:   12/04/16 0701 12/05/16 0514  BP: (!) 105/53 (!) 111/47  Pulse: 94 92  Resp:  19  Temp: 97.8 F (36.6 C) 98.4 F (36.9 C)    General: obtunded Cardiovascular: S1S2/tachycardic Respiratory: decreased B at bases, poor air movement  Discharge Instructions    Current Discharge Medication List    START taking these medications   Details  morphine 250 mg in sodium chloride 0.9 % 240 mL Inject 1 mg/hr into the vein continuous.    Morphine Sulfate (MORPHINE CONCENTRATE) 10 mg / 0.5 ml concentrated solution Take 0.25 mLs (5 mg total) by mouth every 4 (four) hours as  needed for severe pain. Qty: 10 mL, Refills: 0      CONTINUE these medications which have NOT CHANGED   Details  albuterol (PROVENTIL HFA;VENTOLIN HFA) 108 (90 Base) MCG/ACT inhaler Inhale 2 puffs into the lungs every 6 (six) hours as needed. Qty: 1 Inhaler,  Refills: 1    alendronate (FOSAMAX) 70 MG tablet Take 1 tablet (70 mg total) by mouth every 7 (seven) days. Take with a full glass of water on an empty stomach. Qty: 4 tablet, Refills: 11    clonazePAM (KLONOPIN) 0.5 MG tablet Take 0.5 mg by mouth 2 (two) times daily.  Refills: 5    Nutritional Supplements (ENSURE HIGH PROTEIN PO) Take by mouth daily.    pantoprazole (PROTONIX) 40 MG tablet TAKE 1 TABLET BY MOUTH EVERY DAY Qty: 30 tablet, Refills: 5   Associated Diagnoses: Gastroesophageal reflux disease with esophagitis    predniSONE (DELTASONE) 10 MG tablet Take 10 mg by mouth every other day.     Pseudoephedrine-Guaifenesin (MUCINEX D) [phone] MG TB12 Take 1 tablet by mouth 2 (two) times daily as needed.     sertraline (ZOLOFT) 100 MG tablet Take 1 tablet by mouth daily.    SPIRIVA RESPIMAT 2.5 MCG/ACT AERS Take 2 puffs by mouth daily.    SYMBICORT 160-4.5 MCG/ACT inhaler INHALE 2 PUFFS INTO THE LUNGS TWICE DAILY Qty: 10.2 g, Refills: 1    Vitamin D, Cholecalciferol, 1000 units CAPS Take 1,000 Units by mouth daily.      STOP taking these medications     azithromycin (ZITHROMAX) 250 MG tablet      XARELTO 20 MG TABS tablet        Allergies  Allergen Reactions  . Codeine Nausea And Vomiting  . Levofloxacin     IV form caused swelling in hand, itching and burning.  . Lovenox [Enoxaparin Sodium]     Per EMS      The results of significant diagnostics from this hospitalization (including imaging, microbiology, ancillary and laboratory) are listed below for reference.    Significant Diagnostic Studies: Ct Chest Wo Contrast  Result Date: 11/30/2016 CLINICAL DATA:  Shortness of Breath EXAM: CT CHEST WITHOUT CONTRAST TECHNIQUE: Multidetector CT imaging of the chest was performed following the standard protocol without IV contrast. COMPARISON:  Chest x-ray from earlier in the same day FINDINGS: Cardiovascular: Somewhat limited by a lack of IV contrast. Aortic  calcifications are seen without aneurysmal dilatation. Cardiac structures are within normal limits. No significant coronary calcifications are noted. Mediastinum/Nodes: Thoracic inlet is within normal limits. No hilar or mediastinal adenopathy is seen. Some calcified mediastinal lymph nodes are noted consistent with prior granulomatous disease. Lungs/Pleura: Bilateral pleural effusions are seen left greater than right similar to that noted on recent plain film examination. Left lower lobe consolidation is seen. Additionally there is some patchy rounded density in both the anterior aspect of the right upper lobe on image number 77 of series 3 and in the anterior aspect of the left upper lobe on image number 70 of series 3. These changes were not present on the prior exam. Upper Abdomen: Mild ascites is noted but incompletely evaluated on this exam. No other upper abdominal abnormality is seen. Musculoskeletal: Chronic compression deformity of T5 is noted. IMPRESSION: Bilateral pleural effusions left greater than right with left lower lobe consolidation. Nodular areas of increased density which may simply represent infiltrate although the possibility of mass lesion could not be totally excluded on the basis of this exam. Short-term followup following appropriate  therapy is recommended this would be best accomplished with additional noncontrast CT scan. Electronically Signed   By: Alcide CleverMark  Lukens M.D.   On: 11/30/2016 14:52   Dg Chest Port 1 View  Result Date: 11/30/2016 CLINICAL DATA:  SOB, more oxygen needs, possible fluid overload because of stopped lasix. EXAM: PORTABLE CHEST - 1 VIEW COMPARISON:  11/29/2016 FINDINGS: Right arm PICC line stable. Mild central pulmonary vascular congestion stable. Heart size normal.  Atheromatous aorta. Bilateral pleural effusions left greater than right as before. No pneumothorax. Visualized bones unremarkable. IMPRESSION: 1. Little change in pulmonary vascular congestion and  bilateral pleural effusions. Electronically Signed   By: Corlis Leak  Hassell M.D.   On: 11/30/2016 08:35   Dg Chest Port 1 View  Result Date: 11/29/2016 CLINICAL DATA:  Followup of pleural effusion. COPD. Hyperlipidemia. EXAM: PORTABLE CHEST 1 VIEW COMPARISON:  11/28/2016 FINDINGS: Patient rotated to the right. Right sided PICC line terminates at the mid right atrium, may have been advanced in the interval. Cardiomegaly accentuated by AP portable technique. Left greater than right pleural effusions persist. No pneumothorax. Interstitial edema is moderate and increased. Left greater than right base airspace disease. IMPRESSION: Slight worsening aeration with increasing congestive heart failure. Similar bilateral pleural effusions with bibasilar airspace disease. Right-sided PICC line may have been advanced in the interval and terminates at the mid right atrium. Consider retraction 3-4 cm. Electronically Signed   By: Jeronimo GreavesKyle  Talbot M.D.   On: 11/29/2016 08:19   Dg Chest Port 1 View  Result Date: 11/28/2016 CLINICAL DATA:  Acute respiratory failure.  Pneumonia. EXAM: PORTABLE CHEST 1 VIEW COMPARISON:  11/28/2016 FINDINGS: Right PICC line, stable. Cardiomediastinal silhouette is normal. Mediastinal contours appear intact. Calcific atherosclerotic disease of the aorta. No evidence of pneumothorax. Mild upper lobe predominant emphysematous changes. There is persistent left pleural effusion, moderate in size. There has been interval development of moderate in size right pleural effusion. Bilateral lower lobe airspace consolidation versus atelectasis. Osseous structures are without acute abnormality. Soft tissues are grossly normal. IMPRESSION: Interval development of moderate in size right pleural effusion. Stable to increased in size left pleural effusion. Bilateral lower lobe airspace consolidation versus atelectasis. Moderate in severity emphysematous changes. Electronically Signed   By: Ted Mcalpineobrinka  Dimitrova M.D.   On:  11/28/2016 18:25   Dg Chest Port 1 View  Result Date: 11/28/2016 CLINICAL DATA:  Acute respiratory failure with hypoxia. EXAM: PORTABLE CHEST 1 VIEW COMPARISON:  11/27/2016 FINDINGS: Right-sided PICC line with tip at the cavoatrial junction. Lungs are adequately inflated demonstrate suggestion of a small right effusion with interval improvement. Opacification in the left base likely moderate size left effusion with associated atelectasis without significant change. Cardiomediastinal silhouette is within normal. There is calcified plaque over the aortic arch. IMPRESSION: Left base opacification likely moderate size effusion with associated atelectasis. Cannot exclude infection in the left base. Improved small right effusion. Right-sided PICC line with tip at the cavoatrial junction. Aortic atherosclerosis. Electronically Signed   By: Elberta Fortisaniel  Boyle M.D.   On: 11/28/2016 08:41   Dg Chest Port 1 View  Result Date: 11/27/2016 CLINICAL DATA:  Hypoxia. EXAM: PORTABLE CHEST 1 VIEW COMPARISON:  11/26/2016 FINDINGS: Basilar consolidation and effusions persist bilaterally, worsened on the right. Apices are obscured by kyphosis. Right upper extremity PICC line probably reaches the right atrium. IMPRESSION: Consolidation and effusions in both bases, worsened on the right. Electronically Signed   By: Ellery Plunkaniel R Mitchell M.D.   On: 11/27/2016 00:56   [facility]  Result Date: 11/26/2016 CLINICAL DATA:  Respiratory failure EXAM: PORTABLE CHEST 1 VIEW COMPARISON:  11/25/2016 FINDINGS: Left lower lobe consolidation and left effusion unchanged. Right lung remains clear. Negative for heart failure. Apical scarring bilaterally. IMPRESSION: Left lower lobe consolidation and left effusion unchanged. Possible pneumonia. Electronically Signed   By: Marlan Palauharles  Clark M.D.   On: 11/26/2016 06:52   Dg Chest Port 1 View  Result Date: 11/25/2016 CLINICAL DATA:  Hypoxemia EXAM: PORTABLE CHEST 1 VIEW COMPARISON:  11/23/2016  FINDINGS: Cardiac shadow is within normal limits. The lungs are well aerated bilaterally. Bilateral pleural effusions are noted left greater than right increased from the prior exam. Left retrocardiac atelectasis is noted. No bony abnormality is seen. IMPRESSION: Increasing left basilar atelectasis and pleural effusions bilaterally as described. Electronically Signed   By: Alcide CleverMark  Lukens M.D.   On: 11/25/2016 07:23   Dg Chest Portable 1 View  Result Date: 11/23/2016 CLINICAL DATA:  Initial evaluation for acute chest pain, weakness. EXAM: PORTABLE CHEST 1 VIEW COMPARISON:  Prior radiograph from 11/03/2016. FINDINGS: Transverse heart size within normal limits. Mediastinal silhouette normal. Aortic atherosclerosis noted. Lungs mildly hypoinflated. Changes related to emphysema noted. There is hazy density overlying the left lung base, which could reflect atelectasis and/or overlying soft tissue attenuation. Possible infiltrate not excluded. No other focal infiltrates. No pulmonary edema or pleural effusion. No pneumothorax. No acute osseous abnormality. IMPRESSION: 1. Hazy opacity overlying the peripheral left lung base. While this finding may be related to atelectasis and/or overlying soft tissue attenuation, possible infectious infiltrate could be considered in the correct clinical setting. 2. Emphysema. 3. Aortic atherosclerosis. Electronically Signed   By: Rise MuBenjamin  McClintock M.D.   On: 11/23/2016 01:41    Microbiology: Recent Results (from the past 240 hour(s))  MRSA PCR Screening     Status: None   Collection Time: 11/27/16 12:27 PM  Result Value Ref Range Status   MRSA by PCR NEGATIVE NEGATIVE Final    Comment:        The GeneXpert MRSA Assay (FDA approved for NASAL specimens only), is one component of a comprehensive MRSA colonization surveillance program. It is not intended to diagnose MRSA infection nor to guide or monitor treatment for MRSA infections.   Respiratory Panel by PCR      Status: None   Collection Time: 11/29/16  3:30 AM  Result Value Ref Range Status   Adenovirus NOT DETECTED NOT DETECTED Final   Coronavirus 229E NOT DETECTED NOT DETECTED Final   Coronavirus HKU1 NOT DETECTED NOT DETECTED Final   Coronavirus NL63 NOT DETECTED NOT DETECTED Final   Coronavirus OC43 NOT DETECTED NOT DETECTED Final   Metapneumovirus NOT DETECTED NOT DETECTED Final   Rhinovirus / Enterovirus NOT DETECTED NOT DETECTED Final   Influenza A NOT DETECTED NOT DETECTED Final   Influenza B NOT DETECTED NOT DETECTED Final   Parainfluenza Virus 1 NOT DETECTED NOT DETECTED Final   Parainfluenza Virus 2 NOT DETECTED NOT DETECTED Final   Parainfluenza Virus 3 NOT DETECTED NOT DETECTED Final   Parainfluenza Virus 4 NOT DETECTED NOT DETECTED Final   Respiratory Syncytial Virus NOT DETECTED NOT DETECTED Final   Bordetella pertussis NOT DETECTED NOT DETECTED Final   Chlamydophila pneumoniae NOT DETECTED NOT DETECTED Final   Mycoplasma pneumoniae NOT DETECTED NOT DETECTED Final     Labs: Basic Metabolic Panel:  Recent Labs Lab 11/28/16 1630  11/29/16 0435  11/29/16 1025 11/29/16 1232 11/29/16 1432 11/30/16 0212 12/01/16 0434  NA 127*  < > 132*  < >  135 136 133* 134* 134*  K 3.2*  --  3.7  --   --   --   --  3.9 3.4*  CL 76*  --  83*  --   --   --   --  83* 75*  CO2 42*  --  42*  --   --   --   --  47* 50*  GLUCOSE 149*  --  165*  --   --   --   --  91 128*  BUN 11  --  14  --   --   --   --  15 13  CREATININE 0.35*  --  0.38*  --   --   --   --  <0.30* 0.35*  CALCIUM 8.2*  --  7.8*  --   --   --   --  8.5* 8.2*  MG 2.0  --  1.8  --   --   --   --  2.1 1.9  < > = values in this interval not displayed. Liver Function Tests:  Recent Labs Lab 11/28/16 1630  AST 27  ALT 51  ALKPHOS 61  BILITOT 0.5  PROT 4.2*  ALBUMIN 2.6*   No results for input(s): LIPASE, AMYLASE in the last 168 hours.  Recent Labs Lab 11/28/16 1618  AMMONIA 27   CBC:  Recent Labs Lab  11/28/16 1630  WBC 10.6*  HGB 8.5*  HCT 25.9*  MCV 94.9  PLT 151   Cardiac Enzymes: No results for input(s): CKTOTAL, CKMB, CKMBINDEX, TROPONINI in the last 168 hours. BNP: BNP (last 3 results)  Recent Labs  11/23/16 0105 11/25/16 0454  BNP 218.0* 119.0*    ProBNP (last 3 results) No results for input(s): PROBNP in the last 8760 hours.  CBG:  Recent Labs Lab 12/01/16 1648  GLUCAP 118*       SignedZannie Cove MD.  [facility] 12/05/2016, 2:58 PM

## 2016-12-05 NOTE — Clinical Social Work Note (Addendum)
CSW received consult that patient will need hospice placement.  CSW contacted patient's son Trey PaulaJeff at [phone] [facility], CSW contacted hospice facility to make referral, CSW awaiting for call back from hospice home admissions worker.  CSW to continue to follow patient's progress througout discharge planning.  [facility] and they requested patient's clinicals to be faxed to them, CSW faxed requested information hospice facility will review and call back CSW.  3:15pm  CSW received phone call back from [facility] at [phone] who said they can accept patient possibly on Monday.  Hospice Home is unable to order Morphine drip today, but they are going to try to get it on Monday.  [facility] updated patient's son, and CSW updated physician.  Ervin KnackEric R. Dimetri Armitage, MSW, [employer] phone [phone] 12/05/2016 11:30 AM

## 2016-12-05 NOTE — Progress Notes (Signed)
PROGRESS NOTE    Shannon Jackson  NWG:[id number] DOB: 1946/09/24 DOA: 11/22/2016 PCP: Frederica KusterMILLER, STEPHEN M, MD  Brief Narrative: 70 y.o.WF PMHx Atrial fibrillation on Xarelto, Pulmonary Hypertension COPD, PE,Raynaud's syndrome, HLD, GERD, who came to the ED with generalized weakness for past 2 days. Patient was seen [facility] couple of weeks ago and diagnosed with atrial fibrillation and failure to thrive at that time she was admitted to ICU and had several episodes of atrial fibrillation. [facility] and [place] care yesterday.Patient signed herself out [facility] from [facility] on 11/21/2016 as she was not happy with the care provided at [facility]. At home patient started feeling generalized weakness and was brought to hospital for further evaluation. In the ED patient was found to be in A. fib with RVR, Resp failure, started on amio-infusion  Assessment & Plan:     Acute on chronic respiratory failure with hypercapnia (HCC)   Atrial fibrillation with rapid ventricular response (HCC)   COPD (chronic obstructive pulmonary disease) (HCC)   Hyperlipidemia   Acute on chronic respiratory failure (HCC)   Hypoxemia   COPD exacerbation (HCC)   Pleural effusion, right   Pulmonary hypertension   Acute saddle pulmonary embolism with acute cor pulmonale (HCC)   Hyponatremia   Major depressive disorder with single episode, in partial remission (HCC)   Personal history of noncompliance with medical treatment, presenting hazards to health   Respiratory acidosis   Pleural effusion   Acute respiratory failure with hypoxia (HCC)   Pulmonary emphysema (HCC)   Bilateral pleural effusion   Hypotension   Goals of care, counseling/discussion   Palliative care encounter   Comfort measures only status  -Followed by Pulmonary as well, due to poor response to Tx, underwent palliative care meeting on 12/28, now comfort  care on morphine gtt. -may need residential hospice if she doesn't decline soon  DVT prophylaxis: comfort care Code Status: DNR Family Communication: family at bedside Disposition Plan: expect hospital demise   Consultants:   PCCM   Procedures:    Subjective:   Objective: Vitals:   12/03/16 0800 12/03/16 1837 12/04/16 0701 12/05/16 0514  BP:  (!) 106/53 (!) 105/53 (!) 111/47  Pulse:  83 94 92  Resp:  12  19  Temp: 97.7 F (36.5 C) 97.8 F (36.6 C) 97.8 F (36.6 C) 98.4 F (36.9 C)  TempSrc: Axillary Axillary Oral Oral  SpO2:  100% 98% 98%  Weight:      Height:        Intake/Output Summary (Last 24 hours) at 12/05/16 1449 Last data filed at 12/05/16 1056  Gross per 24 hour  Intake              255 ml  Output              950 ml  Net             -695 ml   Filed Weights   11/30/16 0600 12/01/16 0419 12/02/16 0341  Weight: 63.2 kg (139 lb 5.3 oz) 61.4 kg (135 lb 5.8 oz) 60.8 kg (134 lb 0.6 oz)    Examination:  General exam: poorly responsive but comfortable  Respiratory system: decreased BS, poor air movement Cardiovascular system: S1 & S2 heard, RRR Gastrointestinal system: Abdomen is nondistended, soft and nontender.Normal bowel sounds heard. Central nervous system:obtunded Extremities: unable to assess Skin: No rashes, lesions or ulcers Psychiatry: unable to  assess    Data Reviewed: I have personally reviewed following labs and imaging studies  CBC:  Recent Labs Lab 11/28/16 1630  WBC 10.6*  HGB 8.5*  HCT 25.9*  MCV 94.9  PLT 151   Basic Metabolic Panel:  Recent Labs Lab 11/28/16 1630  11/29/16 0435  11/29/16 1025 11/29/16 1232 11/29/16 1432 11/30/16 0212 12/01/16 0434  NA 127*  < > 132*  < > 135 136 133* 134* 134*  K 3.2*  --  3.7  --   --   --   --  3.9 3.4*  CL 76*  --  83*  --   --   --   --  83* 75*  CO2 42*  --  42*  --   --   --   --  47* 50*  GLUCOSE 149*  --  165*  --   --   --   --  91 128*  BUN 11  --  14  --   --    --   --  15 13  CREATININE 0.35*  --  0.38*  --   --   --   --  <0.30* 0.35*  CALCIUM 8.2*  --  7.8*  --   --   --   --  8.5* 8.2*  MG 2.0  --  1.8  --   --   --   --  2.1 1.9  < > = values in this interval not displayed. GFR: Estimated Creatinine Clearance: 60.4 mL/min (by C-G formula based on SCr of 0.35 mg/dL (L)). Liver Function Tests:  Recent Labs Lab 11/28/16 1630  AST 27  ALT 51  ALKPHOS 61  BILITOT 0.5  PROT 4.2*  ALBUMIN 2.6*   No results for input(s): LIPASE, AMYLASE in the last 168 hours.  Recent Labs Lab 11/28/16 1618  AMMONIA 27   Coagulation Profile: No results for input(s): INR, PROTIME in the last 168 hours. Cardiac Enzymes: No results for input(s): CKTOTAL, CKMB, CKMBINDEX, TROPONINI in the last 168 hours. BNP (last 3 results) No results for input(s): PROBNP in the last 8760 hours. HbA1C: No results for input(s): HGBA1C in the last 72 hours. CBG:  Recent Labs Lab 12/01/16 1648  GLUCAP 118*   Lipid Profile: No results for input(s): CHOL, HDL, LDLCALC, TRIG, CHOLHDL, LDLDIRECT in the last 72 hours. Thyroid Function Tests: No results for input(s): TSH, T4TOTAL, FREET4, T3FREE, THYROIDAB in the last 72 hours. Anemia Panel: No results for input(s): VITAMINB12, FOLATE, FERRITIN, TIBC, IRON, RETICCTPCT in the last 72 hours. Urine analysis:    Component Value Date/Time   COLORURINE YELLOW 11/23/2016 0226   APPEARANCEUR HAZY (A) 11/23/2016 0226   LABSPEC 1.016 11/23/2016 0226   PHURINE 8.0 11/23/2016 0226   GLUCOSEU NEGATIVE 11/23/2016 0226   HGBUR NEGATIVE 11/23/2016 0226   BILIRUBINUR NEGATIVE 11/23/2016 0226   KETONESUR NEGATIVE 11/23/2016 0226   PROTEINUR 30 (A) 11/23/2016 0226   NITRITE NEGATIVE 11/23/2016 0226   LEUKOCYTESUR NEGATIVE 11/23/2016 0226   Sepsis Labs: @LABRCNTIP (procalcitonin:4,lacticidven:4)  ) Recent Results (from the past 240 hour(s))  MRSA PCR Screening     Status: None   Collection Time: 11/27/16 12:27 PM  Result  Value Ref Range Status   MRSA by PCR NEGATIVE NEGATIVE Final    Comment:        The GeneXpert MRSA Assay (FDA approved for NASAL specimens only), is one component of a comprehensive MRSA colonization surveillance program. It is not intended to diagnose MRSA  infection nor to guide or monitor treatment for MRSA infections.   Respiratory Panel by PCR     Status: None   Collection Time: 11/29/16  3:30 AM  Result Value Ref Range Status   Adenovirus NOT DETECTED NOT DETECTED Final   Coronavirus 229E NOT DETECTED NOT DETECTED Final   Coronavirus HKU1 NOT DETECTED NOT DETECTED Final   Coronavirus NL63 NOT DETECTED NOT DETECTED Final   Coronavirus OC43 NOT DETECTED NOT DETECTED Final   Metapneumovirus NOT DETECTED NOT DETECTED Final   Rhinovirus / Enterovirus NOT DETECTED NOT DETECTED Final   Influenza A NOT DETECTED NOT DETECTED Final   Influenza B NOT DETECTED NOT DETECTED Final   Parainfluenza Virus 1 NOT DETECTED NOT DETECTED Final   Parainfluenza Virus 2 NOT DETECTED NOT DETECTED Final   Parainfluenza Virus 3 NOT DETECTED NOT DETECTED Final   Parainfluenza Virus 4 NOT DETECTED NOT DETECTED Final   Respiratory Syncytial Virus NOT DETECTED NOT DETECTED Final   Bordetella pertussis NOT DETECTED NOT DETECTED Final   Chlamydophila pneumoniae NOT DETECTED NOT DETECTED Final   Mycoplasma pneumoniae NOT DETECTED NOT DETECTED Final         Radiology Studies: No results found.      Scheduled Meds: . bisacodyl  10 mg Rectal Once  . feeding supplement (ENSURE ENLIVE)  237 mL Oral BID BM  . LORazepam  0.5 mg Intravenous TID  . sodium chloride flush  10-40 mL Intracatheter Q12H  . sodium chloride flush  3 mL Intravenous Q12H   Continuous Infusions: . morphine 1 mg/hr (12/03/16 1600)     LOS: 12 days    Time spent:    Zannie Cove, MD [facility] Pager [phone]  If 7PM-7AM, please contact night-coverage [email or web address] Password [email or web address] 12/05/2016,  2:49 PM

## 2016-12-05 NOTE — Progress Notes (Addendum)
Daily Progress Note   Patient Name: Shannon Jackson       Date: 12/05/2016 DOB: June 14, 1946  Age: 70 y.o. MRN#: [id number] Attending Physician: Shannon CovePreetha Joseph, Jackson Primary Care Physician: Shannon Jackson Admit Date: 11/22/2016  Reason for Consultation/Follow-up: Establishing goals of care, Non pain symptom management, Pain control and Terminal Care  Subjective: Patient sleeping.  Sisters at bedside.  Answered their questions.  Explained that she is appropriate for residential hospice.  The oxygen n/c is prolonging her life at this point.   Sisters have noticed patient is having tremors.  Spoke with son, Shannon Jackson on the phone.  We discussed residential hospice.  He is hesitant because his mother never wanted to be in a "facility".  [facility] this past summer and they did a nice job for him.   Shannon Jackson gives the OK to see what hospice beds are available.  He asks about other hospice facilities in the area as well.  Length of Stay: 12  Current Medications: Scheduled Meds:  . amiodarone  200 mg Oral Daily  . bisacodyl  10 mg Rectal Once  . feeding supplement (ENSURE ENLIVE)  237 mL Oral BID BM  . LORazepam  0.5 mg Intravenous BID  . sodium chloride flush  10-40 mL Intracatheter Q12H  . sodium chloride flush  3 mL Intravenous Q12H    Continuous Infusions: . morphine 1 mg/hr (12/03/16 1600)    PRN Meds: [CANCELED] Place/Maintain arterial line **AND** sodium chloride, sodium chloride, ipratropium-albuterol, levalbuterol, LORazepam, morphine, ondansetron **OR** ondansetron (ZOFRAN) IV, polyethylene glycol, sodium chloride flush, sodium chloride flush  Physical Exam  Constitutional:  Frail ill appearing female in NAD.  +Pallor.  Nose  tip is greyish, blue.  She has a mild generalized tremor in her head.  Appears comfortable with family at bedside.  HENT:  Head: Normocephalic and atraumatic.  Eyes: EOM are normal. No scleral icterus.  Cardiovascular: Normal rate.   Murmur heard. Pulmonary/Chest: No respiratory distress.  Weak respiratory effort.   On N/C at 5 liters with no increased work of breathing.  Abdominal: Soft. She exhibits no distension.  Neurological:  Sleeping.  Not responsive to voice or light touch.  Psychiatric:  Unable to assess          Vital Signs:  BP (!) 111/47 (BP Location: Left Arm)   Pulse 92   Temp 98.4 F (36.9 C) (Oral)   Resp 19   Ht 5\' 6"  (1.676 Jackson)   Wt 60.8 kg (134 lb 0.6 oz)   SpO2 98%   BMI 21.63 kg/Jackson  SpO2: SpO2: 98 % O2 Device: O2 Device: Nasal Cannula O2 Flow Rate: O2 Flow Rate (L/min): 5 L/min  Intake/output summary:   Intake/Output Summary (Last 24 hours) at 12/05/16 1050 Last data filed at 12/04/16 1745  Gross per 24 hour  Intake              255 ml  Output              500 ml  Net             -245 ml   LBM: Last BM Date: 11/27/16 Baseline Weight: Weight: 50.3 kg (111 lb) Most recent weight: Weight: 60.8 kg (134 lb 0.6 oz)       Palliative Assessment/Data:    Flowsheet Rows   Flowsheet Row Most Recent Value  Intake Tab  Referral Department  Hospitalist  Unit at Time of Referral  Intermediate Care Unit  Palliative Care Primary Diagnosis  Pulmonary  Date Notified  12/01/16  Palliative Care Type  New Palliative care  Reason for referral  Clarify Goals of Care, End of Life Care Assistance  Date of Admission  11/22/16  Date first seen by Palliative Care  12/02/16  # of days Palliative referral response time  1 Day(s)  # of days IP prior to Palliative referral  9  Clinical Assessment  Palliative Performance Scale Score  20%  Dyspnea Max Last 24 Hours  6  Dyspnea Min Last 24 hours  4  Anxiety Max Last 24 Hours  6  Anxiety Min Last 24 Hours  4    Psychosocial & Spiritual Assessment  Palliative Care Outcomes  Patient/Family meeting held?  Yes  Palliative Care Outcomes  Improved non-pain symptom therapy, Clarified goals of care, Changed to focus on comfort      Patient Active Problem List   Diagnosis Date Noted  . Terminal care   . Palliative care encounter   . Comfort measures only status   . Pulmonary emphysema (HCC)   . Bilateral pleural effusion   . Hypotension   . Goals of care, counseling/discussion   . Major depressive disorder with single episode, in partial remission (HCC)   . Personal history of noncompliance with medical treatment, presenting hazards to health   . Respiratory acidosis   . Pleural effusion   . Acute respiratory failure with hypoxia (HCC)   . Disorientation   . Acute on chronic respiratory failure with hypercapnia (HCC)   . COPD exacerbation (HCC)   . Pleural effusion, right   . Pulmonary hypertension   . Acute saddle pulmonary embolism with acute cor pulmonale (HCC)   . Hyponatremia   . Hypoxemia   . Acute on chronic respiratory failure (HCC) 11/25/2016  . Atrial fibrillation with rapid ventricular response (HCC) 11/23/2016  . Atrial fibrillation with RVR (HCC) 11/23/2016  . Osteoporosis 09/22/2016  . Allergic rhinitis   . Arthritis   . Osteopenia   . Hyperlipidemia   . Raynaud's disease 06/28/2013  . COPD (chronic obstructive pulmonary disease) (HCC) 11/10/2011    Palliative Care Assessment & Plan   Patient Profile: 70 y.o. female  with past medical history of COPD, PE, Afib, and failure to thrive  who was admitted on  11/22/2016 with chest pain and SOB.  During this hospitalization she was [facility], but [facility].  She has been unable to wean from BiPap for any extended period of time since admission.  She had acute on chronic respiratory failure on admission from multiple etiologies including possible pneumonia, bilateral pleural effusions,  with bilateral upper lobe densities of uncertain significance, advanced COPD, and in recent weeks she had developed afib with RVR.  She was treated with antibiotics and pulmonary support.  She was also seen by cardiology and placed on an amiodarone gtt.  Unfortunately her afib remained uncontrolled and her breathing has not significantly improved despite 11 days of hospital treatment.  Assessment: Gracefully dying.   On N/C at 5 liters.  With mild head tremors.  Recommendations/Plan: Continue Morphine gtt at 1 mg/hr.  I do not believe the tremor is due to OIN Continue ativan scheduled (increase frequency to TID due to tremors) and PRN Appropriate for Hospice House.  Discussed with son.  Order placed for social work. Not appropriate for SNF.  Definitively needs Hospice House or she may pass in the hospital.  Goals of Care and Additional Recommendations:  Limitations on Scope of Treatment: Full Comfort Care  Code Status:  DNR  Prognosis:   Hours - Days  Discharge Planning:  Hospice House vs Hospital Death.  Care plan was discussed with bedside RN and Prentiss BellsJeff Anglin ([facility])  Thank you for allowing the Palliative Medicine Team to assist in the care of this patient.   Time In: 10:30 Time Out: 11:05 Total Time 35 MIN Prolonged Time Billed NO      Greater than 50%  of this time was spent counseling and coordinating care related to the above assessment and plan.  Algis DownsMarianne York, PA-C Palliative Medicine   Please contact Palliative MedicineTeam phone at [phone] for questions and concerns.   Please see AMION for individual provider pager numbers.

## 2016-12-06 MED ORDER — ACETAMINOPHEN 650 MG RE SUPP
650.0000 mg | RECTAL | Status: DC | PRN
  Administered 2016-12-06: 650 mg via RECTAL
  Filled 2016-12-06: qty 1

## 2016-12-06 MED ORDER — HEPARIN SOD (PORK) LOCK FLUSH 100 UNIT/ML IV SOLN
250.0000 [IU] | INTRAVENOUS | Status: AC | PRN
  Administered 2016-12-06: 250 [IU]

## 2016-12-06 NOTE — Progress Notes (Signed)
Called hospice for update and to inform them that patient is in route.Wasted about 180 ml of morphine drip witnessed by Romeo AppleBen, RCharity fundraiser

## 2016-12-06 NOTE — Clinical Social Work Note (Signed)
[facility], all meds supply delivered, ok to transport pt after 2PM.  CSW called [facility] for transport. Family updated by nurse. DC Summary faxed to facility.  Pt has no other needs at this time.  CSW is signing off.  Pam Vanalstine B. Gean QuintBrown,MSW, LCSWA Clinical Social Work Dept Weekend Social Worker [phone] 3:54 PM

## 2016-12-06 NOTE — Progress Notes (Addendum)
[facility]Prior to [facility] patient was on morphine drip at 1mg  /hr. O2 was changed to 100% via facemask  prior to discharge .

## 2016-12-06 NOTE — Progress Notes (Signed)
Report given to [facility], [place] , [place]

## 2017-01-06 DEATH — deceased

## 2018-01-24 IMAGING — CR DG CHEST 1V PORT
1 series · 1 of 1 positions shown · non-contrast
Comparison: Prior radiograph from 11/03/2016.

CLINICAL DATA: Initial evaluation for acute chest pain, weakness.

EXAM:
PORTABLE CHEST 1 VIEW

[ap]
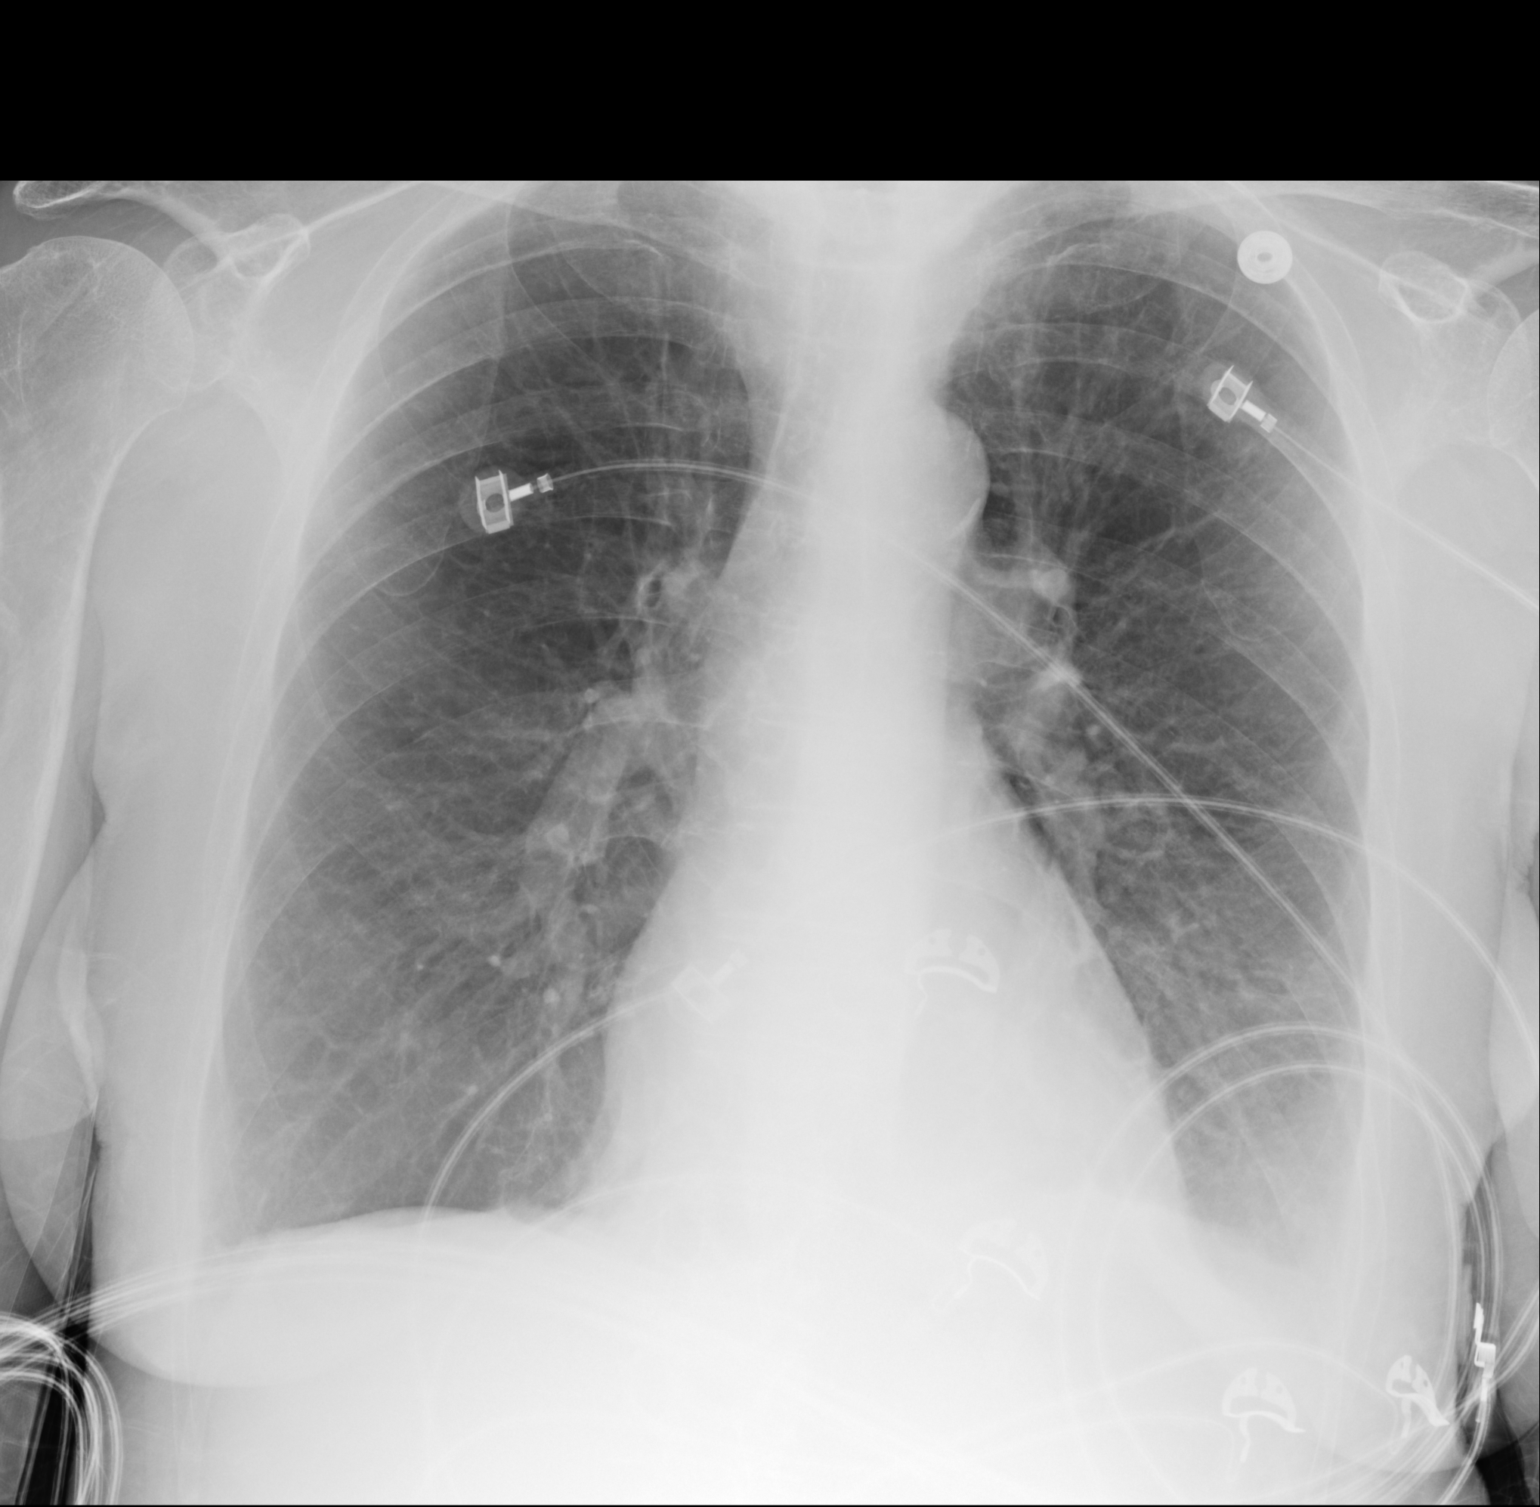

[1 of 1 positions shown; findings below may reference images not displayed]

FINDINGS: Transverse heart size within normal limits. Mediastinal silhouette
normal. Aortic atherosclerosis noted.

Lungs mildly hypoinflated. Changes related to emphysema noted. There
is hazy density overlying the left lung base, which could reflect
atelectasis and/or overlying soft tissue attenuation. Possible
infiltrate not excluded. No other focal infiltrates. No pulmonary
edema or pleural effusion. No pneumothorax.

No acute osseous abnormality.
IMPRESSION: 1. Hazy opacity overlying the peripheral left lung base. While this
finding may be related to atelectasis and/or overlying soft tissue
attenuation, possible infectious infiltrate could be considered in
the correct clinical setting.
2. Emphysema.
3. Aortic atherosclerosis.

## 2018-01-27 IMAGING — CR DG CHEST 1V PORT
1 series · 1 of 1 positions shown · non-contrast
Comparison: 11/25/2016

CLINICAL DATA: Respiratory failure

EXAM:
PORTABLE CHEST 1 VIEW

[portable]
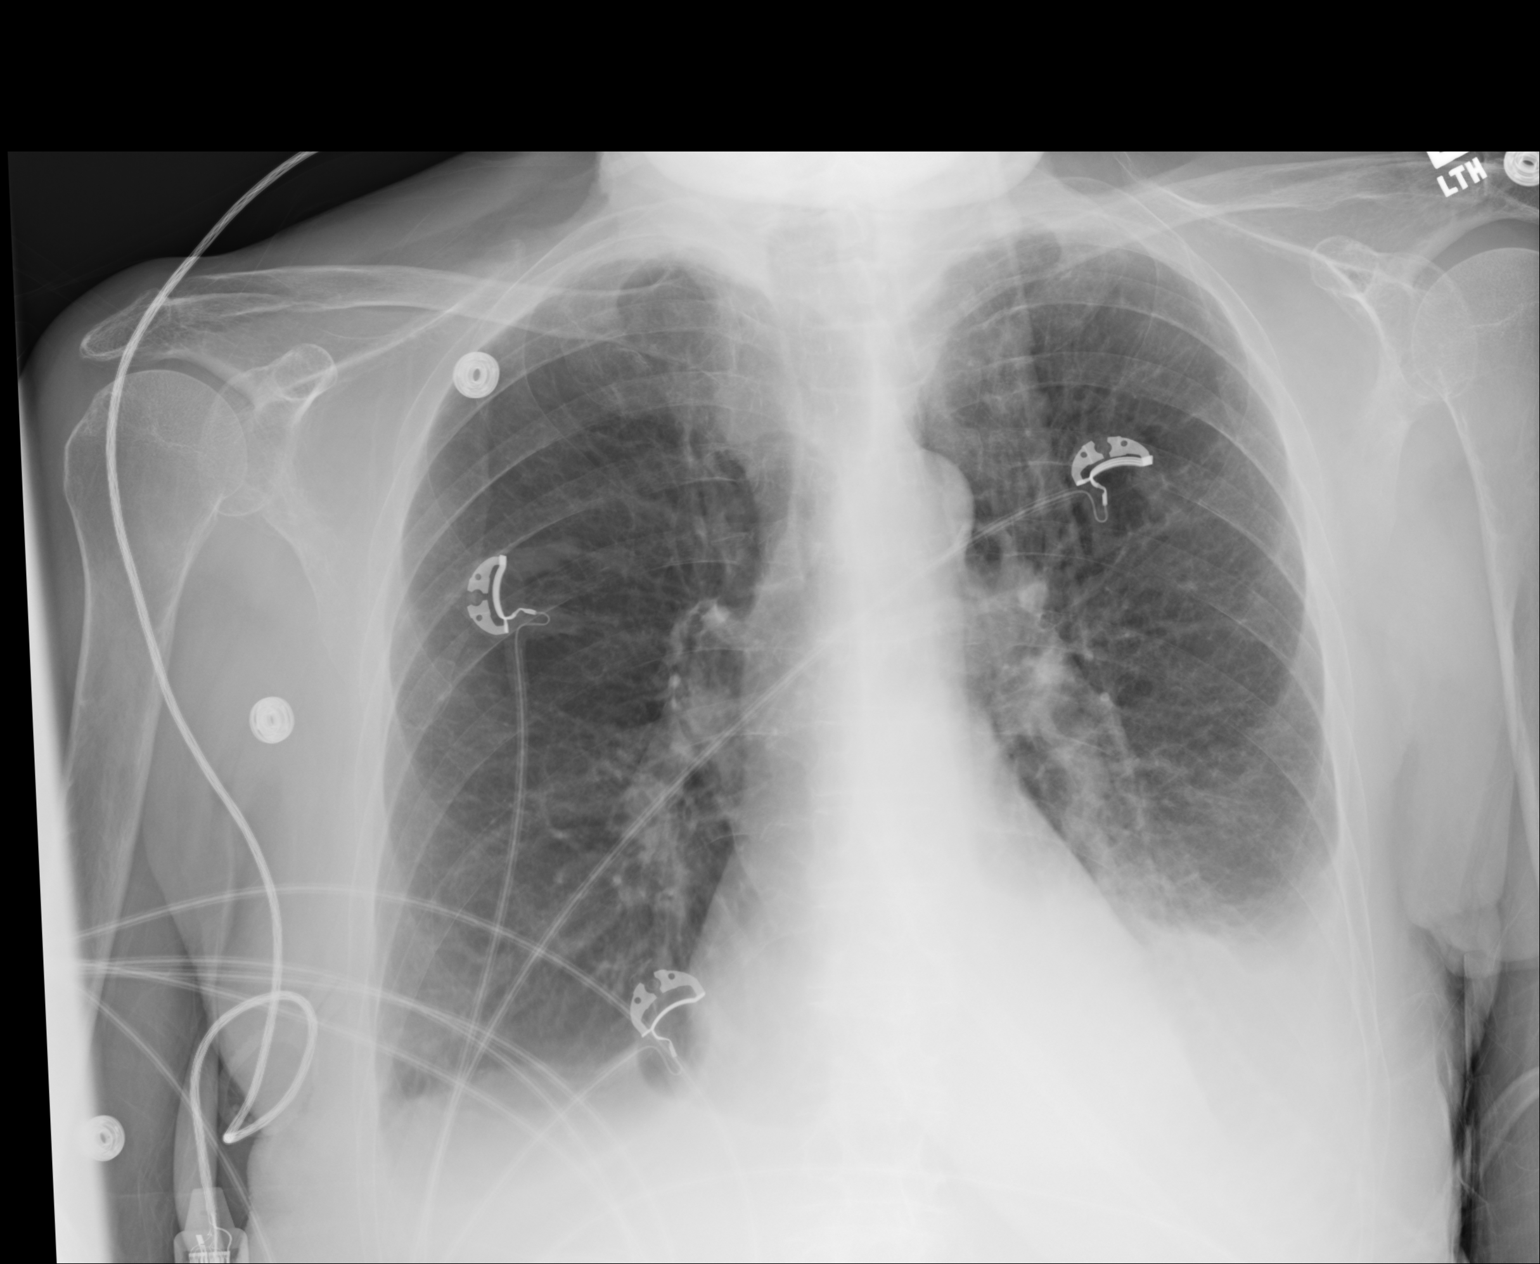

[1 of 1 positions shown; findings below may reference images not displayed]

FINDINGS: Left lower lobe consolidation and left effusion unchanged. Right
lung remains clear. Negative for heart failure. Apical scarring
bilaterally.
IMPRESSION: Left lower lobe consolidation and left effusion unchanged. Possible
pneumonia.

## 2018-01-28 IMAGING — CR DG CHEST 1V PORT
1 series · 1 of 1 positions shown · non-contrast
Comparison: 11/26/2016

CLINICAL DATA: Hypoxia.

EXAM:
PORTABLE CHEST 1 VIEW

[ap portable]
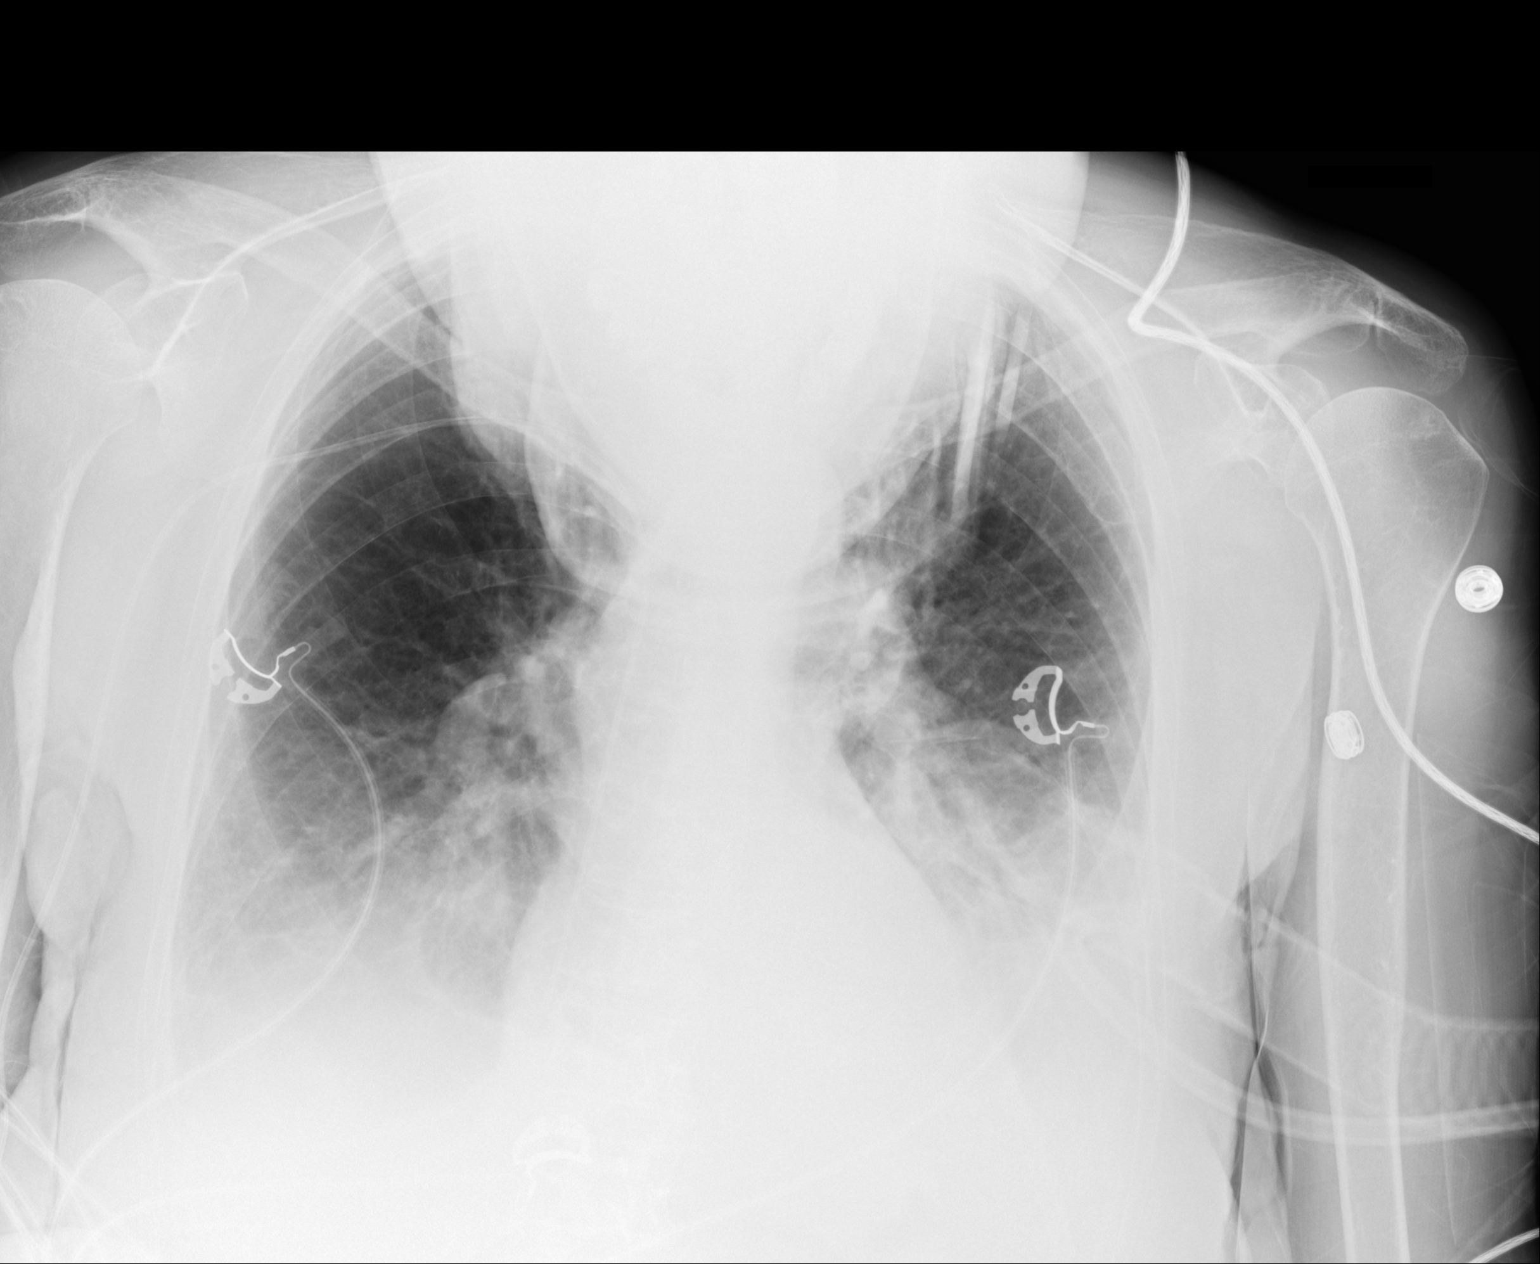

[1 of 1 positions shown; findings below may reference images not displayed]

FINDINGS: Basilar consolidation and effusions persist bilaterally, worsened on
the right. Apices are obscured by kyphosis. Right upper extremity
PICC line probably reaches the right atrium.
IMPRESSION: Consolidation and effusions in both bases, worsened on the right.

## 2018-01-29 IMAGING — CR DG CHEST 1V PORT
1 series · 1 of 1 positions shown · non-contrast
Comparison: 11/27/2016

CLINICAL DATA: Acute respiratory failure with hypoxia.

EXAM:
PORTABLE CHEST 1 VIEW

[AP]
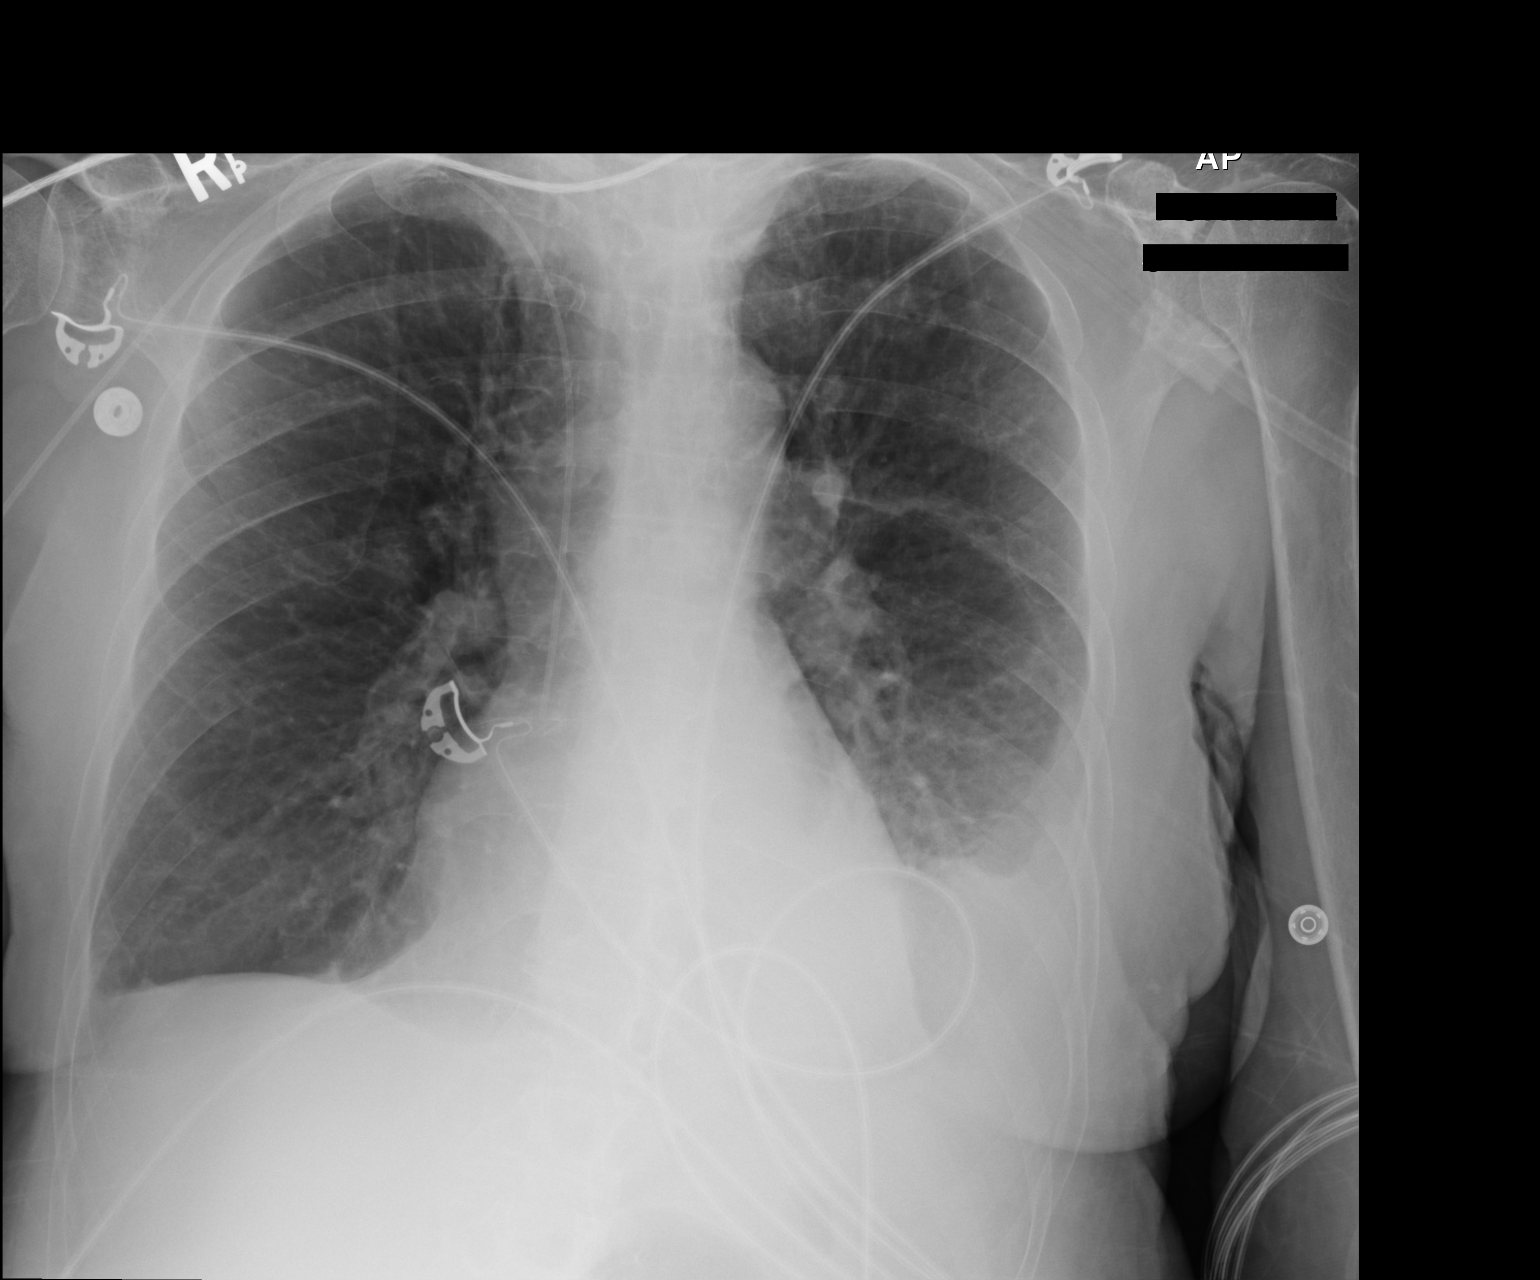

[1 of 1 positions shown; findings below may reference images not displayed]

FINDINGS: Right-sided PICC line with tip at the cavoatrial junction. Lungs are
adequately inflated demonstrate suggestion of a small right effusion
with interval improvement. Opacification in the left base likely
moderate size left effusion with associated atelectasis without
significant change. Cardiomediastinal silhouette is within normal.
There is calcified plaque over the aortic arch.
IMPRESSION: Left base opacification likely moderate size effusion with
associated atelectasis. Cannot exclude infection in the left base.
Improved small right effusion.

Right-sided PICC line with tip at the cavoatrial junction.

Aortic atherosclerosis.

## 2018-01-29 IMAGING — CR DG CHEST 1V PORT
1 series · 1 of 1 positions shown · non-contrast
Comparison: 11/28/2016

CLINICAL DATA: Acute respiratory failure.  Pneumonia.

EXAM:
PORTABLE CHEST 1 VIEW

[AP]
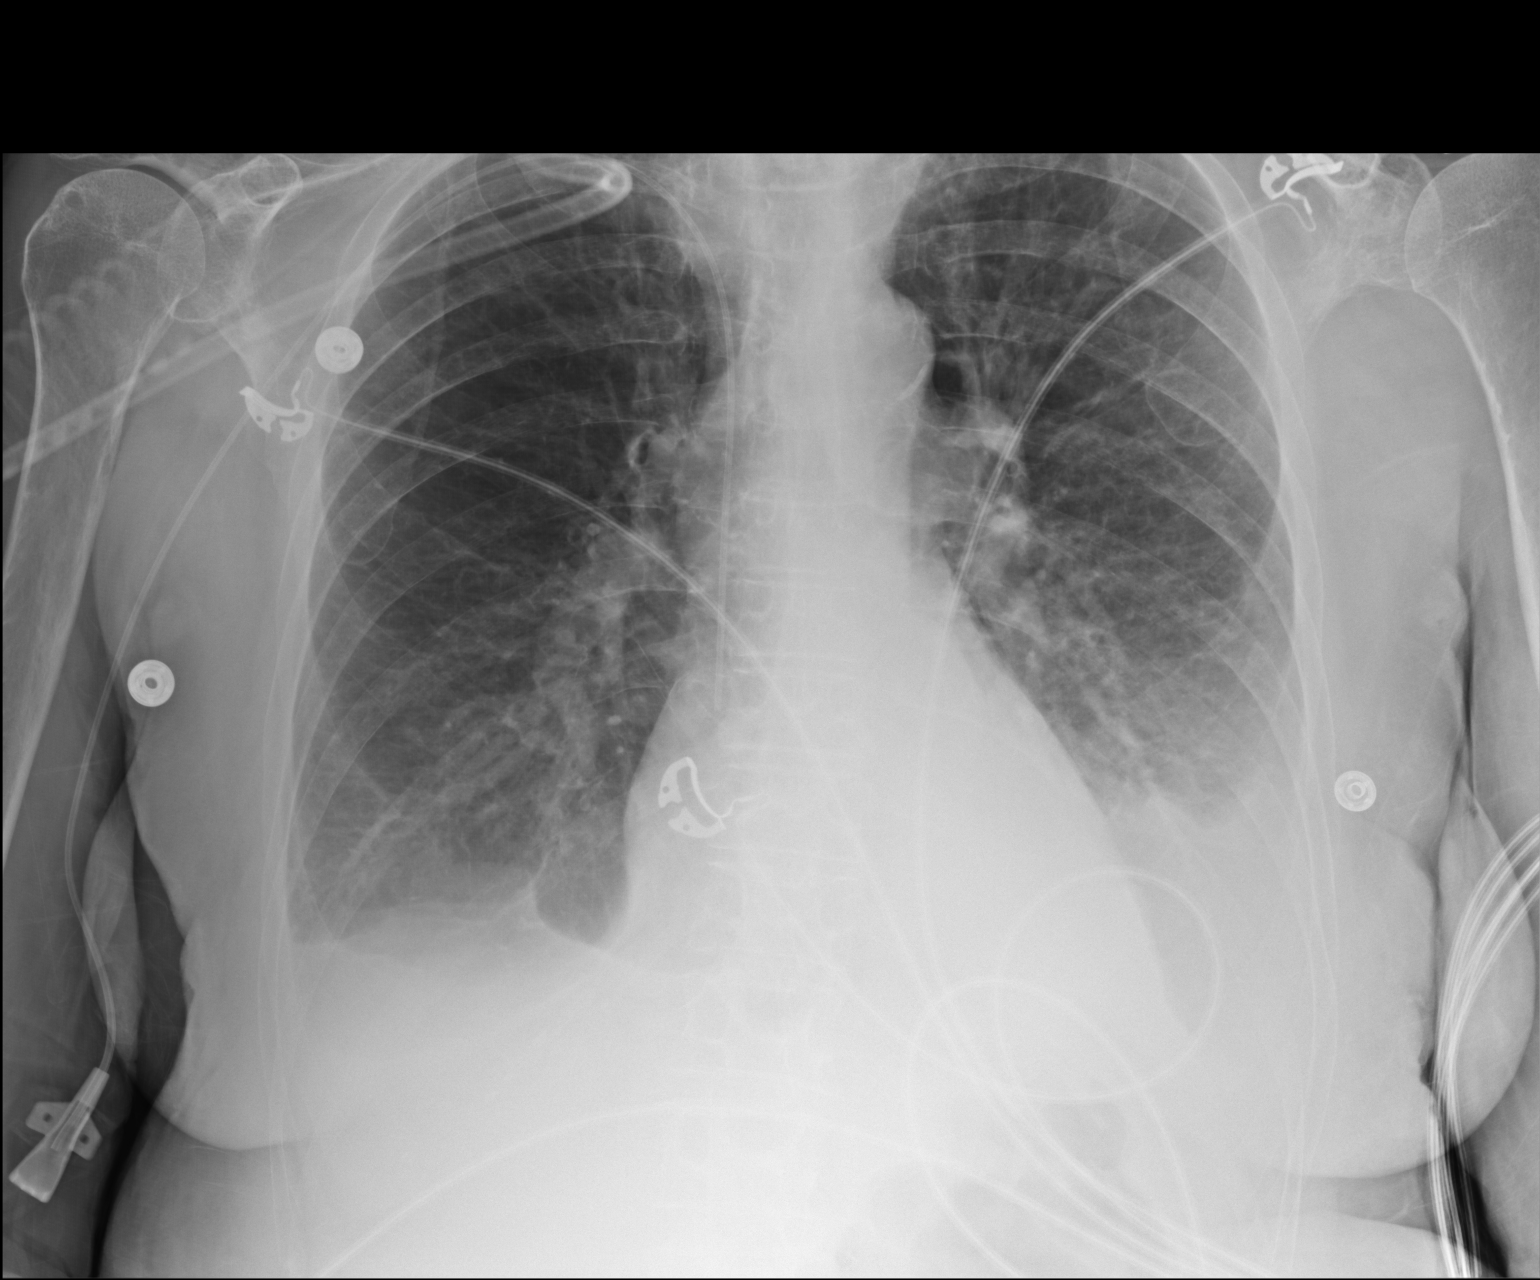

[1 of 1 positions shown; findings below may reference images not displayed]

FINDINGS: Right PICC line, stable.

Cardiomediastinal silhouette is normal. Mediastinal contours appear
intact. Calcific atherosclerotic disease of the aorta.

No evidence of pneumothorax. Mild upper lobe predominant
emphysematous changes. There is persistent left pleural effusion,
moderate in size. There has been interval development of moderate in
size right pleural effusion. Bilateral lower lobe airspace
consolidation versus atelectasis.

Osseous structures are without acute abnormality. Soft tissues are
grossly normal.
IMPRESSION: Interval development of moderate in size right pleural effusion.

Stable to increased in size left pleural effusion.

Bilateral lower lobe airspace consolidation versus atelectasis.

Moderate in severity emphysematous changes.
